# Patient Record
Sex: Male | Born: 1945 | Hispanic: No | Marital: Married | Smoking: Former smoker
Health system: Southern US, Community
[De-identification: ages and names within clinical notes are randomized; demographics above are authoritative.]

## PROBLEM LIST (undated history)

## (undated) DIAGNOSIS — I499 Cardiac arrhythmia, unspecified: Secondary | ICD-10-CM

## (undated) DIAGNOSIS — J449 Chronic obstructive pulmonary disease, unspecified: Secondary | ICD-10-CM

## (undated) DIAGNOSIS — K7460 Unspecified cirrhosis of liver: Secondary | ICD-10-CM

## (undated) DIAGNOSIS — I10 Essential (primary) hypertension: Secondary | ICD-10-CM

## (undated) DIAGNOSIS — I96 Gangrene, not elsewhere classified: Secondary | ICD-10-CM

## (undated) DIAGNOSIS — I2510 Atherosclerotic heart disease of native coronary artery without angina pectoris: Secondary | ICD-10-CM

## (undated) DIAGNOSIS — M869 Osteomyelitis, unspecified: Secondary | ICD-10-CM

## (undated) DIAGNOSIS — I739 Peripheral vascular disease, unspecified: Secondary | ICD-10-CM

## (undated) DIAGNOSIS — I4891 Unspecified atrial fibrillation: Secondary | ICD-10-CM

## (undated) DIAGNOSIS — R7303 Prediabetes: Secondary | ICD-10-CM

## (undated) DIAGNOSIS — G40909 Epilepsy, unspecified, not intractable, without status epilepticus: Secondary | ICD-10-CM

## (undated) HISTORY — PX: CARDIAC CATHETERIZATION: SHX172

---

## 1996-10-29 HISTORY — PX: HERNIA REPAIR: SHX51

## 2004-08-17 ENCOUNTER — Ambulatory Visit: Payer: Self-pay | Admitting: Pain Medicine

## 2004-09-14 ENCOUNTER — Ambulatory Visit: Payer: Self-pay | Admitting: Pain Medicine

## 2004-10-17 ENCOUNTER — Ambulatory Visit: Payer: Self-pay | Admitting: Pain Medicine

## 2004-11-14 ENCOUNTER — Ambulatory Visit: Payer: Self-pay | Admitting: Pain Medicine

## 2004-11-27 ENCOUNTER — Ambulatory Visit: Payer: Self-pay | Admitting: Internal Medicine

## 2004-11-29 ENCOUNTER — Ambulatory Visit: Payer: Self-pay | Admitting: Internal Medicine

## 2004-12-12 ENCOUNTER — Ambulatory Visit: Payer: Self-pay | Admitting: Pain Medicine

## 2004-12-29 ENCOUNTER — Ambulatory Visit: Payer: Self-pay | Admitting: Internal Medicine

## 2005-01-09 ENCOUNTER — Ambulatory Visit: Payer: Self-pay | Admitting: Pain Medicine

## 2005-01-27 ENCOUNTER — Ambulatory Visit: Payer: Self-pay | Admitting: Internal Medicine

## 2005-02-06 ENCOUNTER — Ambulatory Visit: Payer: Self-pay | Admitting: Pain Medicine

## 2005-03-03 ENCOUNTER — Other Ambulatory Visit: Payer: Self-pay

## 2005-03-03 ENCOUNTER — Inpatient Hospital Stay: Payer: Self-pay | Admitting: Unknown Physician Specialty

## 2005-03-06 ENCOUNTER — Ambulatory Visit: Payer: Self-pay | Admitting: Pain Medicine

## 2005-04-05 ENCOUNTER — Ambulatory Visit: Payer: Self-pay | Admitting: Pain Medicine

## 2005-05-07 ENCOUNTER — Ambulatory Visit: Payer: Self-pay | Admitting: Pain Medicine

## 2005-06-21 ENCOUNTER — Ambulatory Visit: Payer: Self-pay | Admitting: Pain Medicine

## 2005-07-19 ENCOUNTER — Ambulatory Visit: Payer: Self-pay | Admitting: Pain Medicine

## 2005-08-21 ENCOUNTER — Ambulatory Visit: Payer: Self-pay | Admitting: Pain Medicine

## 2005-09-11 ENCOUNTER — Ambulatory Visit: Payer: Self-pay | Admitting: Pain Medicine

## 2005-09-27 ENCOUNTER — Ambulatory Visit: Payer: Self-pay | Admitting: Oncology

## 2005-10-09 ENCOUNTER — Ambulatory Visit: Payer: Self-pay | Admitting: Pain Medicine

## 2005-11-08 ENCOUNTER — Ambulatory Visit: Payer: Self-pay | Admitting: Pain Medicine

## 2005-11-26 ENCOUNTER — Ambulatory Visit: Payer: Self-pay | Admitting: Oncology

## 2005-12-06 ENCOUNTER — Ambulatory Visit: Payer: Self-pay | Admitting: Pain Medicine

## 2006-01-10 ENCOUNTER — Ambulatory Visit: Payer: Self-pay | Admitting: Pain Medicine

## 2006-02-07 ENCOUNTER — Ambulatory Visit: Payer: Self-pay | Admitting: Pain Medicine

## 2006-03-04 ENCOUNTER — Emergency Department: Payer: Self-pay | Admitting: Emergency Medicine

## 2006-03-04 ENCOUNTER — Other Ambulatory Visit: Payer: Self-pay

## 2006-03-07 ENCOUNTER — Inpatient Hospital Stay: Payer: Self-pay | Admitting: Podiatry

## 2006-03-07 ENCOUNTER — Ambulatory Visit: Payer: Self-pay | Admitting: Pain Medicine

## 2006-04-04 ENCOUNTER — Ambulatory Visit: Payer: Self-pay | Admitting: Pain Medicine

## 2006-04-30 ENCOUNTER — Ambulatory Visit: Payer: Self-pay | Admitting: Pain Medicine

## 2006-06-06 ENCOUNTER — Ambulatory Visit: Payer: Self-pay | Admitting: Pain Medicine

## 2006-06-20 ENCOUNTER — Ambulatory Visit: Payer: Self-pay | Admitting: Pain Medicine

## 2006-06-24 ENCOUNTER — Ambulatory Visit: Payer: Self-pay | Admitting: Pain Medicine

## 2006-06-27 ENCOUNTER — Ambulatory Visit: Payer: Self-pay | Admitting: Pain Medicine

## 2006-07-16 ENCOUNTER — Ambulatory Visit: Payer: Self-pay | Admitting: Pain Medicine

## 2006-07-17 ENCOUNTER — Ambulatory Visit: Payer: Self-pay | Admitting: Pain Medicine

## 2006-08-15 ENCOUNTER — Ambulatory Visit: Payer: Self-pay | Admitting: Pain Medicine

## 2006-08-26 ENCOUNTER — Ambulatory Visit: Payer: Self-pay | Admitting: Pain Medicine

## 2006-09-10 ENCOUNTER — Ambulatory Visit: Payer: Self-pay | Admitting: Pain Medicine

## 2006-10-10 ENCOUNTER — Ambulatory Visit: Payer: Self-pay | Admitting: Pain Medicine

## 2006-11-06 ENCOUNTER — Ambulatory Visit: Payer: Self-pay | Admitting: Pain Medicine

## 2006-11-25 ENCOUNTER — Inpatient Hospital Stay: Payer: Self-pay | Admitting: Internal Medicine

## 2006-12-05 ENCOUNTER — Ambulatory Visit: Payer: Self-pay | Admitting: Pain Medicine

## 2007-01-06 ENCOUNTER — Ambulatory Visit: Payer: Self-pay | Admitting: Pain Medicine

## 2007-02-04 ENCOUNTER — Ambulatory Visit: Payer: Self-pay | Admitting: Pain Medicine

## 2007-03-06 ENCOUNTER — Ambulatory Visit: Payer: Self-pay | Admitting: Pain Medicine

## 2007-04-08 ENCOUNTER — Ambulatory Visit: Payer: Self-pay | Admitting: Pain Medicine

## 2007-05-06 ENCOUNTER — Ambulatory Visit: Payer: Self-pay | Admitting: Pain Medicine

## 2007-05-12 ENCOUNTER — Ambulatory Visit: Payer: Self-pay | Admitting: Pain Medicine

## 2007-06-05 ENCOUNTER — Ambulatory Visit: Payer: Self-pay | Admitting: Pain Medicine

## 2007-07-02 ENCOUNTER — Ambulatory Visit: Payer: Self-pay | Admitting: Pain Medicine

## 2007-07-22 ENCOUNTER — Other Ambulatory Visit: Payer: Self-pay

## 2007-07-22 ENCOUNTER — Inpatient Hospital Stay: Payer: Self-pay | Admitting: Cardiovascular Disease

## 2007-07-24 ENCOUNTER — Other Ambulatory Visit: Payer: Self-pay

## 2007-07-31 ENCOUNTER — Ambulatory Visit: Payer: Self-pay | Admitting: Pain Medicine

## 2007-08-27 ENCOUNTER — Ambulatory Visit: Payer: Self-pay | Admitting: Pain Medicine

## 2007-09-08 ENCOUNTER — Other Ambulatory Visit: Payer: Self-pay

## 2007-09-08 ENCOUNTER — Emergency Department: Payer: Self-pay | Admitting: Emergency Medicine

## 2007-09-30 ENCOUNTER — Ambulatory Visit: Payer: Self-pay | Admitting: Pain Medicine

## 2007-11-03 ENCOUNTER — Ambulatory Visit: Payer: Self-pay | Admitting: Pain Medicine

## 2007-12-02 ENCOUNTER — Ambulatory Visit: Payer: Self-pay | Admitting: Pain Medicine

## 2007-12-10 ENCOUNTER — Ambulatory Visit: Payer: Self-pay | Admitting: Pain Medicine

## 2007-12-30 ENCOUNTER — Ambulatory Visit: Payer: Self-pay | Admitting: Pain Medicine

## 2008-01-05 ENCOUNTER — Ambulatory Visit: Payer: Self-pay | Admitting: Pain Medicine

## 2008-01-09 ENCOUNTER — Other Ambulatory Visit: Payer: Self-pay

## 2008-01-09 ENCOUNTER — Emergency Department: Payer: Self-pay | Admitting: Emergency Medicine

## 2008-01-29 ENCOUNTER — Ambulatory Visit: Payer: Self-pay | Admitting: Pain Medicine

## 2008-02-04 ENCOUNTER — Ambulatory Visit: Payer: Self-pay | Admitting: Pain Medicine

## 2008-02-26 ENCOUNTER — Inpatient Hospital Stay: Payer: Self-pay | Admitting: Psychiatry

## 2008-03-25 ENCOUNTER — Ambulatory Visit: Payer: Self-pay | Admitting: Pain Medicine

## 2008-04-15 ENCOUNTER — Ambulatory Visit: Payer: Self-pay | Admitting: Pain Medicine

## 2008-04-20 ENCOUNTER — Ambulatory Visit: Payer: Self-pay | Admitting: Vascular Surgery

## 2008-05-13 ENCOUNTER — Ambulatory Visit: Payer: Self-pay | Admitting: Pain Medicine

## 2008-06-14 ENCOUNTER — Ambulatory Visit: Payer: Self-pay | Admitting: Pain Medicine

## 2008-07-15 ENCOUNTER — Ambulatory Visit: Payer: Self-pay | Admitting: Pain Medicine

## 2008-07-28 ENCOUNTER — Ambulatory Visit: Payer: Self-pay | Admitting: Pain Medicine

## 2008-08-26 ENCOUNTER — Ambulatory Visit: Payer: Self-pay | Admitting: Pain Medicine

## 2008-09-01 ENCOUNTER — Ambulatory Visit: Payer: Self-pay | Admitting: Pain Medicine

## 2008-09-28 ENCOUNTER — Ambulatory Visit: Payer: Self-pay | Admitting: Pain Medicine

## 2008-10-26 ENCOUNTER — Ambulatory Visit: Payer: Self-pay | Admitting: Pain Medicine

## 2008-11-08 ENCOUNTER — Ambulatory Visit: Payer: Self-pay | Admitting: Cardiovascular Disease

## 2008-11-09 IMAGING — CR DG ABDOMEN 2V
1 series · 3 of 3 positions shown · non-contrast
Comparison: none

REASON FOR EXAM: Abdominal pain
COMMENTS:

PROCEDURE:     DXR - DXR ABDOMEN 2 V FLAT AND ERECT  - November 26, 2006  [DATE]
RESULT:       The soft tissue structures are unremarkable.  Inferior vena
cava filter is present.  Aortoiliac atherosclerotic vascular disease is
present.

[Series 1: view not recorded · 0.17mm/px · 3 of 3 slices shown]
[im 1/3]
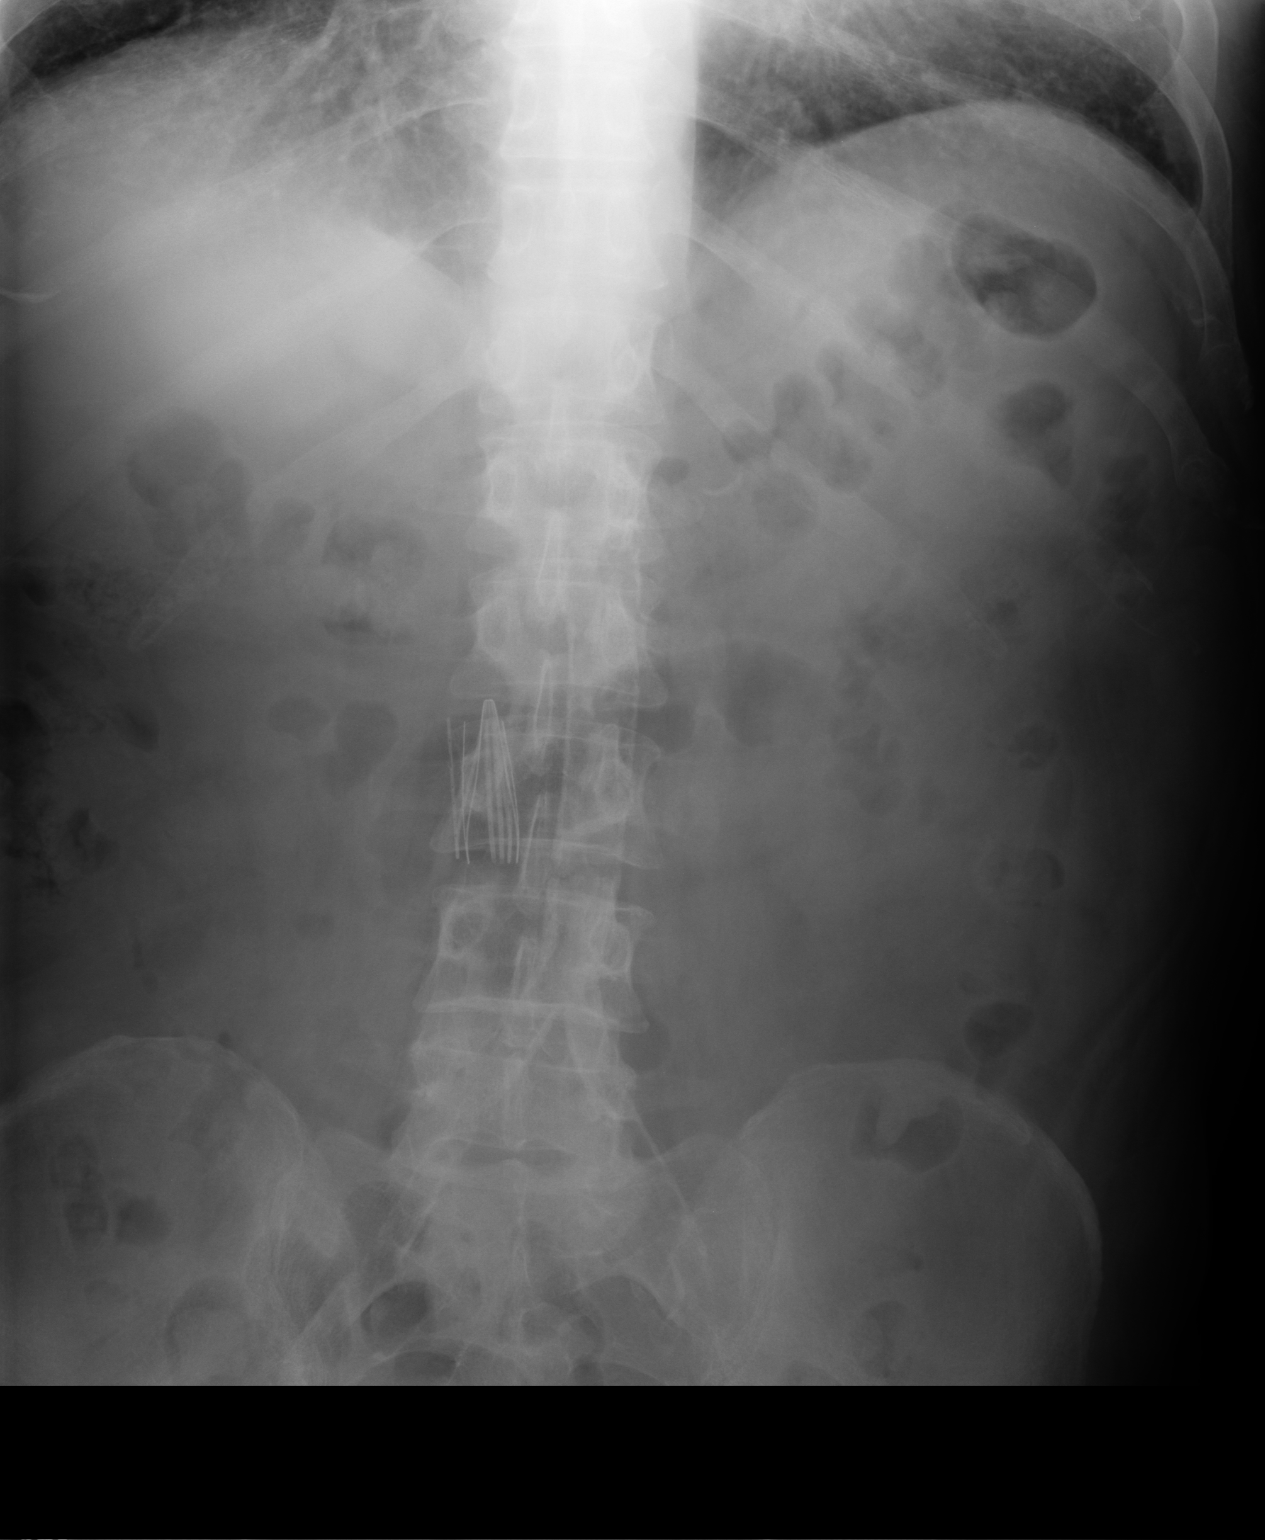
[im 2/3]
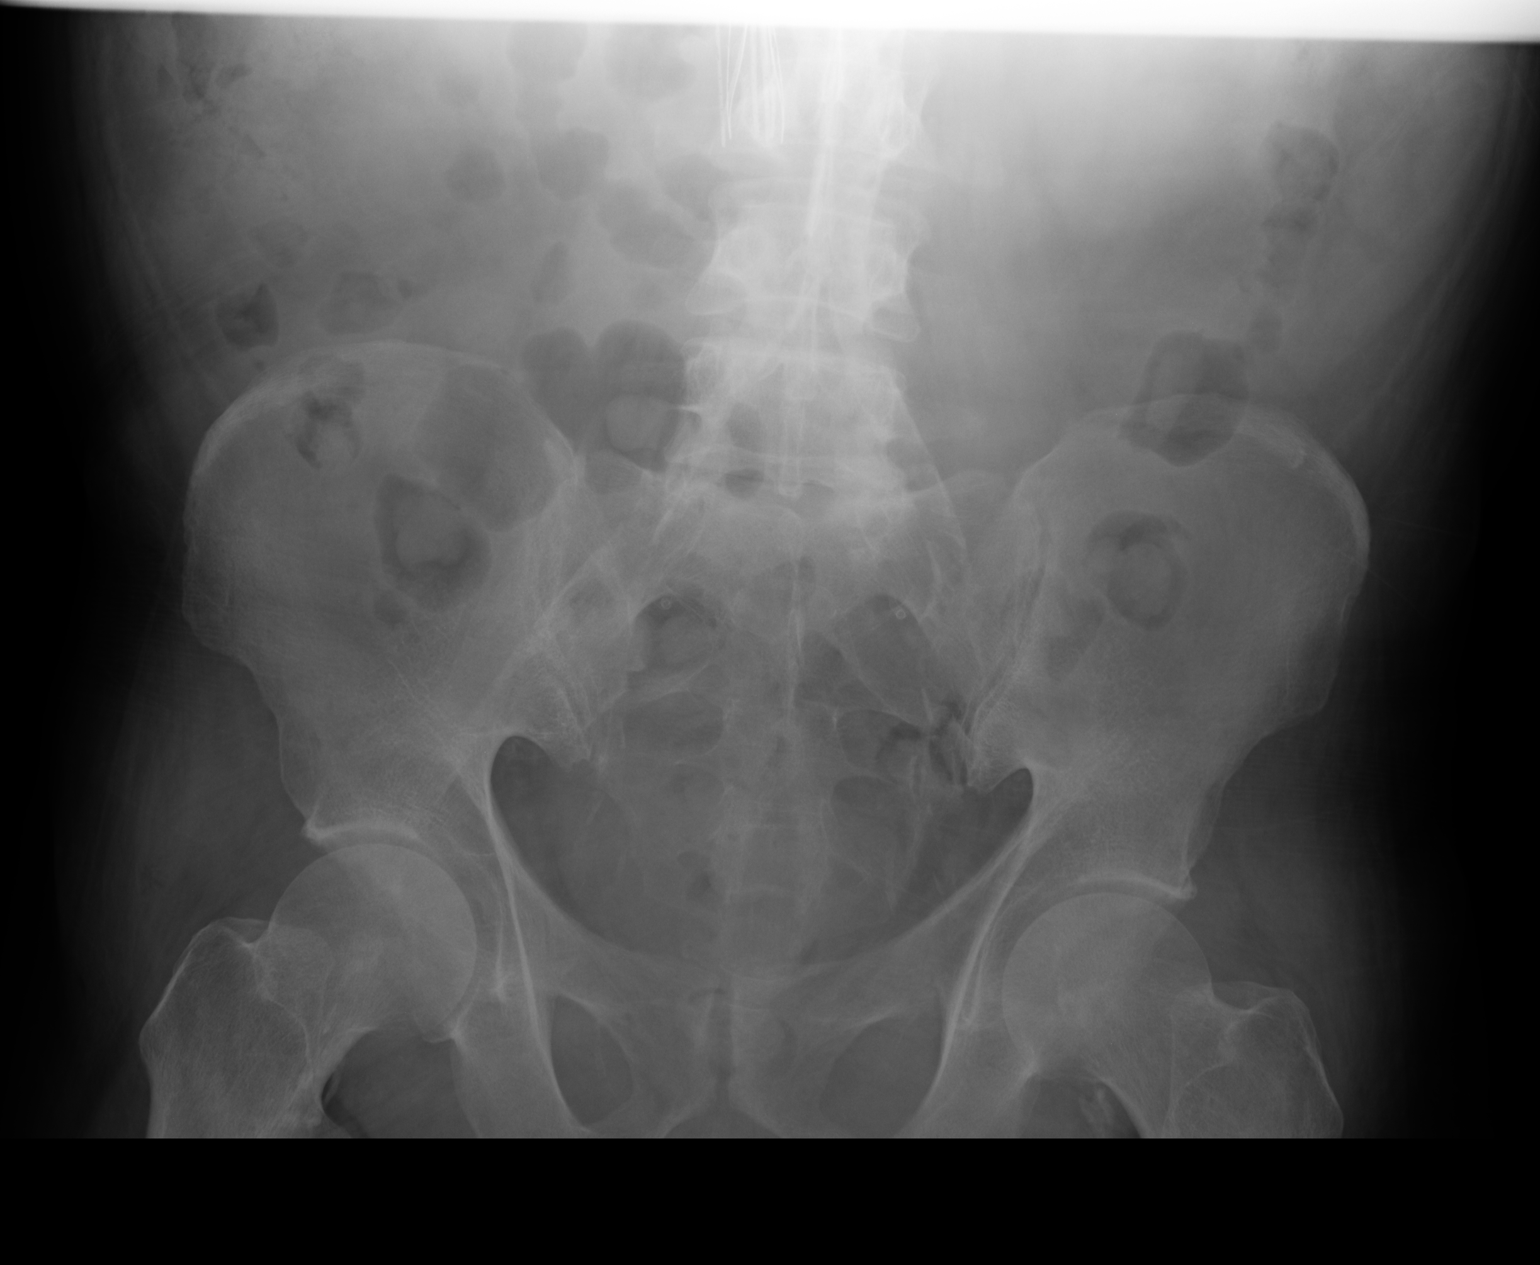
[im 3/3]
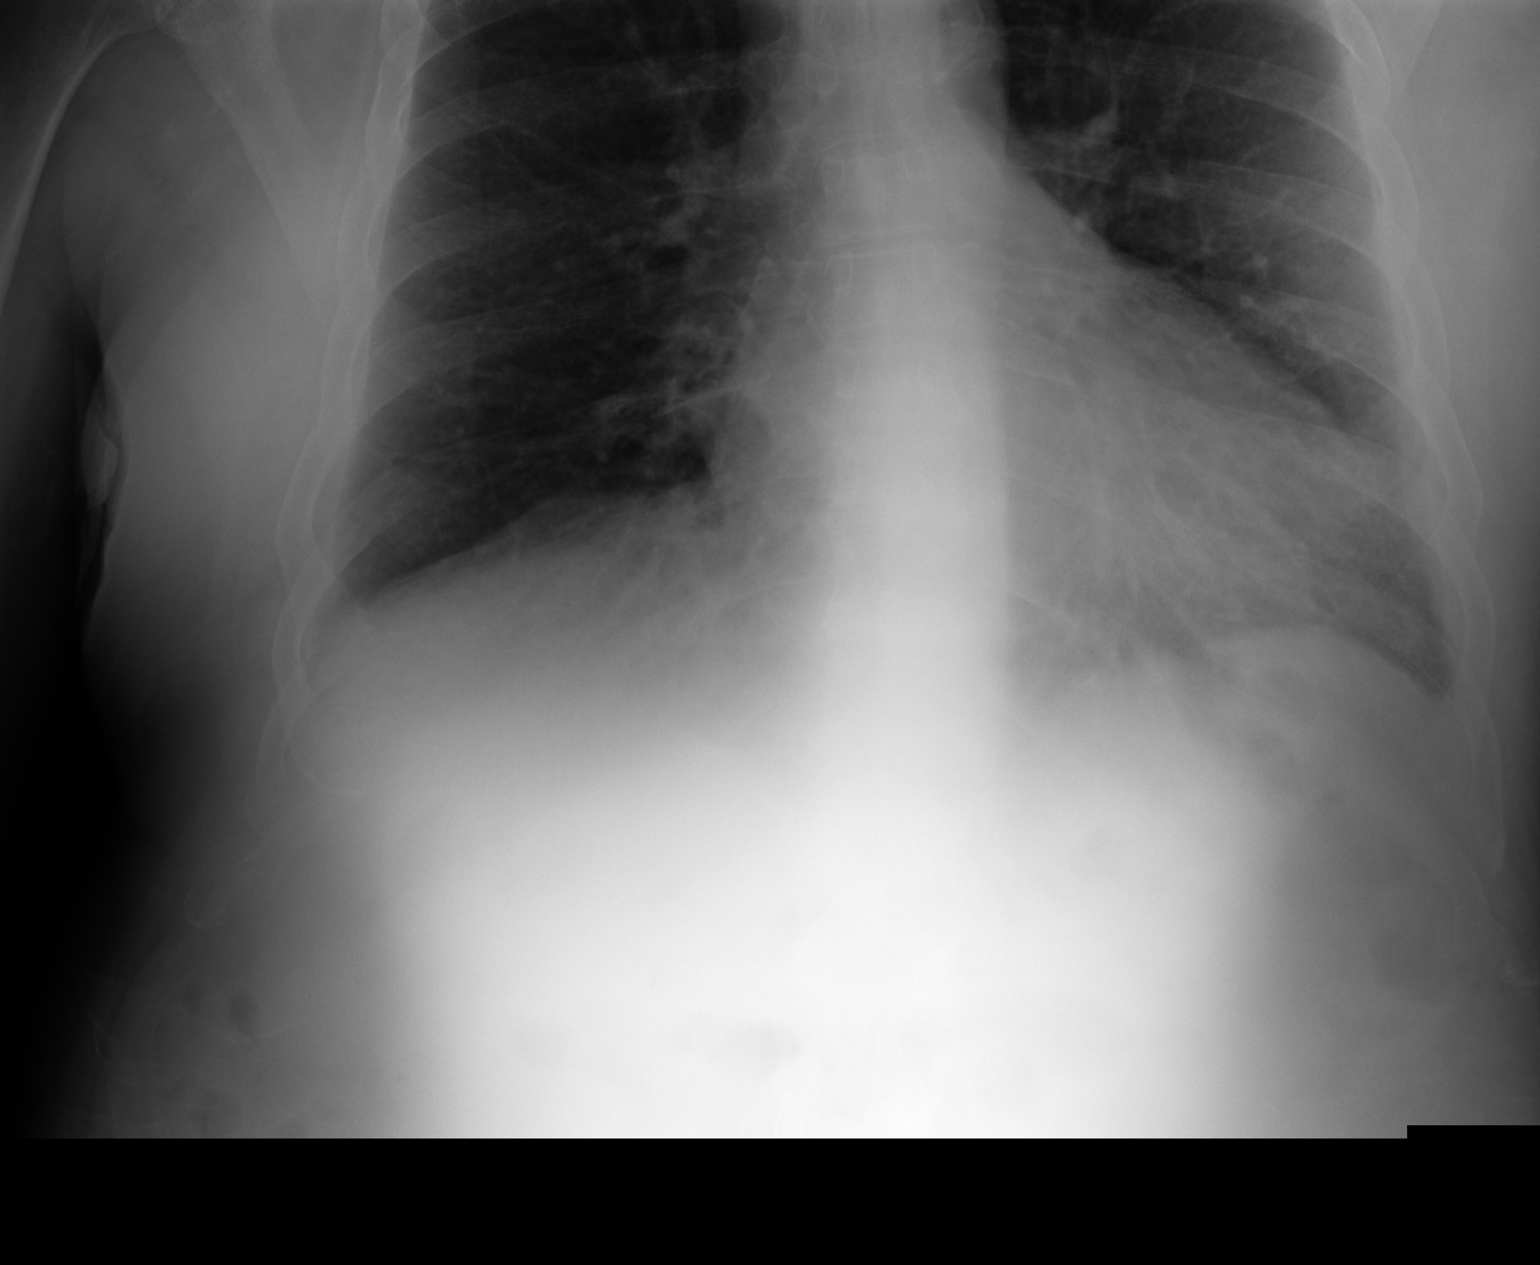

[3 of 3 positions shown; findings below may reference images not displayed]

IMPRESSION: Nonspecific exam.

## 2008-11-22 ENCOUNTER — Inpatient Hospital Stay: Payer: Self-pay | Admitting: Cardiovascular Disease

## 2008-11-25 ENCOUNTER — Ambulatory Visit: Payer: Self-pay | Admitting: Pain Medicine

## 2008-12-23 ENCOUNTER — Ambulatory Visit: Payer: Self-pay | Admitting: Pain Medicine

## 2009-01-20 ENCOUNTER — Ambulatory Visit: Payer: Self-pay | Admitting: Pain Medicine

## 2009-01-27 ENCOUNTER — Ambulatory Visit: Payer: Self-pay | Admitting: Internal Medicine

## 2009-02-09 ENCOUNTER — Ambulatory Visit: Payer: Self-pay | Admitting: Pain Medicine

## 2009-02-26 ENCOUNTER — Ambulatory Visit: Payer: Self-pay | Admitting: Internal Medicine

## 2009-03-22 ENCOUNTER — Ambulatory Visit: Payer: Self-pay | Admitting: Pain Medicine

## 2009-03-28 ENCOUNTER — Ambulatory Visit: Payer: Self-pay | Admitting: Internal Medicine

## 2009-03-29 ENCOUNTER — Ambulatory Visit: Payer: Self-pay | Admitting: Internal Medicine

## 2009-04-12 ENCOUNTER — Inpatient Hospital Stay: Payer: Self-pay | Admitting: Internal Medicine

## 2009-04-21 ENCOUNTER — Ambulatory Visit: Payer: Self-pay | Admitting: Pain Medicine

## 2009-05-18 ENCOUNTER — Ambulatory Visit: Payer: Self-pay | Admitting: Pain Medicine

## 2009-06-16 ENCOUNTER — Ambulatory Visit: Payer: Self-pay | Admitting: Pain Medicine

## 2009-07-18 ENCOUNTER — Ambulatory Visit: Payer: Self-pay | Admitting: Pain Medicine

## 2009-08-18 ENCOUNTER — Ambulatory Visit: Payer: Self-pay | Admitting: Pain Medicine

## 2009-09-13 ENCOUNTER — Ambulatory Visit: Payer: Self-pay | Admitting: Pain Medicine

## 2009-10-11 ENCOUNTER — Ambulatory Visit: Payer: Self-pay | Admitting: Pain Medicine

## 2009-11-10 ENCOUNTER — Ambulatory Visit: Payer: Self-pay | Admitting: Pain Medicine

## 2009-12-08 ENCOUNTER — Ambulatory Visit: Payer: Self-pay | Admitting: Pain Medicine

## 2010-01-05 ENCOUNTER — Ambulatory Visit: Payer: Self-pay | Admitting: Pain Medicine

## 2010-02-07 ENCOUNTER — Ambulatory Visit: Payer: Self-pay | Admitting: Pain Medicine

## 2010-03-09 ENCOUNTER — Ambulatory Visit: Payer: Self-pay | Admitting: Pain Medicine

## 2010-04-06 ENCOUNTER — Ambulatory Visit: Payer: Self-pay | Admitting: Pain Medicine

## 2010-05-09 ENCOUNTER — Ambulatory Visit: Payer: Self-pay | Admitting: Pain Medicine

## 2010-06-08 ENCOUNTER — Ambulatory Visit: Payer: Self-pay | Admitting: Pain Medicine

## 2010-07-06 ENCOUNTER — Ambulatory Visit: Payer: Self-pay | Admitting: Pain Medicine

## 2010-07-31 ENCOUNTER — Ambulatory Visit: Payer: Self-pay | Admitting: Pain Medicine

## 2010-09-05 ENCOUNTER — Ambulatory Visit: Payer: Self-pay | Admitting: Pain Medicine

## 2010-10-05 ENCOUNTER — Ambulatory Visit: Payer: Self-pay | Admitting: Pain Medicine

## 2010-10-09 ENCOUNTER — Ambulatory Visit: Payer: Self-pay | Admitting: Pain Medicine

## 2010-10-31 ENCOUNTER — Ambulatory Visit: Payer: Self-pay | Admitting: Pain Medicine

## 2010-12-04 ENCOUNTER — Ambulatory Visit: Payer: Self-pay | Admitting: Pain Medicine

## 2010-12-15 ENCOUNTER — Ambulatory Visit: Payer: Self-pay | Admitting: Cardiovascular Disease

## 2011-01-02 ENCOUNTER — Ambulatory Visit: Payer: Self-pay | Admitting: Pain Medicine

## 2011-01-04 ENCOUNTER — Inpatient Hospital Stay: Payer: Self-pay | Admitting: Internal Medicine

## 2011-02-01 ENCOUNTER — Ambulatory Visit: Payer: Self-pay | Admitting: Pain Medicine

## 2011-03-02 ENCOUNTER — Inpatient Hospital Stay: Payer: Self-pay | Admitting: Internal Medicine

## 2011-03-06 ENCOUNTER — Ambulatory Visit: Payer: Self-pay | Admitting: Pain Medicine

## 2011-04-03 ENCOUNTER — Ambulatory Visit: Payer: Self-pay | Admitting: Pain Medicine

## 2011-05-01 ENCOUNTER — Ambulatory Visit: Payer: Self-pay | Admitting: Pain Medicine

## 2011-05-15 ENCOUNTER — Ambulatory Visit: Payer: Self-pay | Admitting: Unknown Physician Specialty

## 2011-05-31 ENCOUNTER — Ambulatory Visit: Payer: Self-pay | Admitting: Pain Medicine

## 2011-07-03 ENCOUNTER — Ambulatory Visit: Payer: Self-pay | Admitting: Pain Medicine

## 2011-07-09 ENCOUNTER — Ambulatory Visit: Payer: Self-pay | Admitting: Unknown Physician Specialty

## 2011-07-11 ENCOUNTER — Ambulatory Visit: Payer: Self-pay | Admitting: Pain Medicine

## 2011-07-31 ENCOUNTER — Ambulatory Visit: Payer: Self-pay | Admitting: Pain Medicine

## 2011-08-30 ENCOUNTER — Ambulatory Visit: Payer: Self-pay | Admitting: Pain Medicine

## 2011-09-27 ENCOUNTER — Ambulatory Visit: Payer: Self-pay | Admitting: Pain Medicine

## 2011-11-01 ENCOUNTER — Ambulatory Visit: Payer: Self-pay | Admitting: Pain Medicine

## 2011-11-29 ENCOUNTER — Ambulatory Visit: Payer: Self-pay | Admitting: Pain Medicine

## 2011-12-25 ENCOUNTER — Ambulatory Visit: Payer: Self-pay | Admitting: Pain Medicine

## 2012-01-22 ENCOUNTER — Ambulatory Visit: Payer: Self-pay | Admitting: Pain Medicine

## 2012-02-21 ENCOUNTER — Ambulatory Visit: Payer: Self-pay | Admitting: Pain Medicine

## 2012-03-27 ENCOUNTER — Ambulatory Visit: Payer: Self-pay | Admitting: Pain Medicine

## 2013-07-29 LAB — COMPREHENSIVE METABOLIC PANEL
Albumin: 1.8 g/dL — ABNORMAL LOW (ref 3.4–5.0)
Anion Gap: 7 (ref 7–16)
BUN: 2 mg/dL — ABNORMAL LOW (ref 7–18)
Bilirubin,Total: 0.8 mg/dL (ref 0.2–1.0)
Calcium, Total: 7.1 mg/dL — ABNORMAL LOW (ref 8.5–10.1)
Chloride: 90 mmol/L — ABNORMAL LOW (ref 98–107)
Co2: 32 mmol/L (ref 21–32)
Creatinine: 0.86 mg/dL (ref 0.60–1.30)
EGFR (African American): >60
Glucose: 93 mg/dL (ref 65–99)
Osmolality: 255 (ref 275–301)
Potassium: 2.6 mmol/L — ABNORMAL LOW (ref 3.5–5.1)
SGOT(AST): 31 U/L (ref 15–37)
SGPT (ALT): 10 U/L — ABNORMAL LOW (ref 12–78)
Sodium: 129 mmol/L — ABNORMAL LOW (ref 136–145)
Total Protein: 6.8 g/dL (ref 6.4–8.2)

## 2013-07-29 LAB — ETHANOL: Ethanol: <3 mg/dL

## 2013-07-29 LAB — CBC: HGB: 10.4 g/dL — ABNORMAL LOW (ref 13.0–18.0)

## 2013-07-30 ENCOUNTER — Inpatient Hospital Stay: Payer: Self-pay | Admitting: Internal Medicine

## 2013-07-30 LAB — POTASSIUM
Potassium: 3.3 mmol/L — ABNORMAL LOW (ref 3.5–5.1)
Potassium: 2.6 mmol/L — ABNORMAL LOW (ref 3.5–5.1)

## 2013-07-30 LAB — MAGNESIUM
Magnesium: 0.4 mg/dL — ABNORMAL LOW
Magnesium: 1.2 mg/dL — ABNORMAL LOW

## 2013-07-30 LAB — BASIC METABOLIC PANEL
Calcium, Total: 6.5 mg/dL — CL (ref 8.5–10.1)
Chloride: 91 mmol/L — ABNORMAL LOW (ref 98–107)
Co2: 31 mmol/L (ref 21–32)
EGFR (African American): >60
Potassium: 3.1 mmol/L — ABNORMAL LOW (ref 3.5–5.1)

## 2013-07-30 LAB — URINALYSIS, COMPLETE
Bacteria: NONE SEEN
Bilirubin,UR: NEGATIVE
Blood: NEGATIVE
Glucose,UR: NEGATIVE mg/dL (ref 0–75)
Hyaline Cast: 2
Nitrite: NEGATIVE
Ph: 8 (ref 4.5–8.0)
RBC,UR: <1 /HPF (ref 0–5)
Squamous Epithelial: NONE SEEN

## 2013-07-30 LAB — DRUG SCREEN, URINE
Amphetamines, Ur Screen: NEGATIVE (ref ?–1000)
Barbiturates, Ur Screen: NEGATIVE (ref ?–200)
Cannabinoid 50 Ng, Ur ~~LOC~~: NEGATIVE (ref ?–50)
MDMA (Ecstasy)Ur Screen: NEGATIVE (ref ?–500)

## 2013-07-30 LAB — CK TOTAL AND CKMB (NOT AT ARMC)
CK, Total: 138 U/L (ref 35–232)
CK, Total: 250 U/L — ABNORMAL HIGH (ref 35–232)
CK, Total: 323 U/L — ABNORMAL HIGH (ref 35–232)

## 2013-07-30 LAB — PRO B NATRIURETIC PEPTIDE: B-Type Natriuretic Peptide: 2535 pg/mL — ABNORMAL HIGH (ref 0–125)

## 2013-07-30 LAB — TROPONIN I
Troponin-I: 0.1 ng/mL — ABNORMAL HIGH
Troponin-I: 0.11 ng/mL — ABNORMAL HIGH

## 2013-07-31 LAB — COMPREHENSIVE METABOLIC PANEL
Anion Gap: 6 — ABNORMAL LOW (ref 7–16)
BUN: 4 mg/dL — ABNORMAL LOW (ref 7–18)
Calcium, Total: 6.8 mg/dL — CL (ref 8.5–10.1)
Co2: 31 mmol/L (ref 21–32)
Creatinine: 0.9 mg/dL (ref 0.60–1.30)
EGFR (African American): >60
EGFR (Non-African Amer.): >60
Glucose: 104 mg/dL — ABNORMAL HIGH (ref 65–99)
Osmolality: 260 (ref 275–301)
Potassium: 3.1 mmol/L — ABNORMAL LOW (ref 3.5–5.1)
SGOT(AST): 37 U/L (ref 15–37)
Sodium: 131 mmol/L — ABNORMAL LOW (ref 136–145)

## 2013-07-31 LAB — CBC WITH DIFFERENTIAL/PLATELET
Basophil #: 0.1 10*3/uL (ref 0.0–0.1)
Basophil %: 0.3 %
Eosinophil #: 0 10*3/uL (ref 0.0–0.7)
Eosinophil %: 0 %
HCT: 31.2 % — ABNORMAL LOW (ref 40.0–52.0)
HGB: 10.6 g/dL — ABNORMAL LOW (ref 13.0–18.0)
Lymphocyte #: 1.7 10*3/uL (ref 1.0–3.6)
Lymphocyte %: 5.6 %
MCH: 28.8 pg (ref 26.0–34.0)
MCHC: 34 g/dL (ref 32.0–36.0)
MCV: 85 fL (ref 80–100)
Monocyte #: 0.9 x10 3/mm (ref 0.2–1.0)
Neutrophil %: 91.2 %
Platelet: 156 10*3/uL (ref 150–440)
RBC: 3.69 10*6/uL — ABNORMAL LOW (ref 4.40–5.90)
RDW: 14 % (ref 11.5–14.5)
WBC: 31.1 10*3/uL — ABNORMAL HIGH (ref 3.8–10.6)

## 2013-07-31 LAB — PROTIME-INR: INR: 2.1

## 2013-07-31 LAB — TROPONIN I: Troponin-I: 0.09 ng/mL — ABNORMAL HIGH

## 2013-07-31 LAB — TSH: Thyroid Stimulating Horm: 1.29 u[IU]/mL

## 2013-07-31 LAB — PHENYTOIN LEVEL, TOTAL: Dilantin: 8.4 ug/mL — ABNORMAL LOW (ref 10.0–20.0)

## 2013-07-31 LAB — LIPID PANEL
HDL Cholesterol: 24 mg/dL — ABNORMAL LOW (ref 40–60)
Ldl Cholesterol, Calc: 26 mg/dL (ref 0–100)
VLDL Cholesterol, Calc: 12 mg/dL (ref 5–40)

## 2013-07-31 LAB — URINE CULTURE

## 2013-07-31 LAB — MAGNESIUM: Magnesium: 1.8 mg/dL

## 2013-08-01 ENCOUNTER — Ambulatory Visit: Payer: Self-pay | Admitting: Neurology

## 2013-08-01 LAB — BASIC METABOLIC PANEL
Chloride: 99 mmol/L (ref 98–107)
Co2: 31 mmol/L (ref 21–32)
Creatinine: 0.85 mg/dL (ref 0.60–1.30)
EGFR (Non-African Amer.): >60
Glucose: 74 mg/dL (ref 65–99)
Sodium: 135 mmol/L — ABNORMAL LOW (ref 136–145)

## 2013-08-01 LAB — MAGNESIUM: Magnesium: 1.6 mg/dL — ABNORMAL LOW

## 2013-08-02 LAB — PHENYTOIN LEVEL, TOTAL: Dilantin: 7.5 ug/mL — ABNORMAL LOW (ref 10.0–20.0)

## 2013-08-02 LAB — BASIC METABOLIC PANEL
Anion Gap: 5 — ABNORMAL LOW (ref 7–16)
BUN: 4 mg/dL — ABNORMAL LOW (ref 7–18)
Creatinine: 0.94 mg/dL (ref 0.60–1.30)
EGFR (African American): >60
Glucose: 80 mg/dL (ref 65–99)
Osmolality: 266 (ref 275–301)
Sodium: 135 mmol/L — ABNORMAL LOW (ref 136–145)

## 2013-08-02 LAB — MAGNESIUM: Magnesium: 1.7 mg/dL — ABNORMAL LOW

## 2013-08-03 LAB — CBC WITH DIFFERENTIAL/PLATELET
Basophil #: 0.1 10*3/uL (ref 0.0–0.1)
Eosinophil #: 0.3 10*3/uL (ref 0.0–0.7)
HCT: 24.9 % — ABNORMAL LOW (ref 40.0–52.0)
HGB: 8.5 g/dL — ABNORMAL LOW (ref 13.0–18.0)
Lymphocyte %: 29.5 %
MCH: 29.5 pg (ref 26.0–34.0)
MCV: 87 fL (ref 80–100)
Neutrophil #: 4 10*3/uL (ref 1.4–6.5)
Neutrophil %: 56.4 %
Platelet: 142 10*3/uL — ABNORMAL LOW (ref 150–440)
RBC: 2.86 10*6/uL — ABNORMAL LOW (ref 4.40–5.90)
RDW: 14.4 % (ref 11.5–14.5)

## 2013-08-03 LAB — COMPREHENSIVE METABOLIC PANEL
BUN: 4 mg/dL — ABNORMAL LOW (ref 7–18)
Calcium, Total: 7.4 mg/dL — ABNORMAL LOW (ref 8.5–10.1)
Chloride: 100 mmol/L (ref 98–107)
Creatinine: 0.91 mg/dL (ref 0.60–1.30)
EGFR (African American): >60
EGFR (Non-African Amer.): >60
SGOT(AST): 26 U/L (ref 15–37)
SGPT (ALT): 11 U/L — ABNORMAL LOW (ref 12–78)
Sodium: 134 mmol/L — ABNORMAL LOW (ref 136–145)

## 2013-08-03 LAB — BASIC METABOLIC PANEL
Anion Gap: 4 — ABNORMAL LOW (ref 7–16)
BUN: 4 mg/dL — ABNORMAL LOW (ref 7–18)
Calcium, Total: 7.3 mg/dL — ABNORMAL LOW (ref 8.5–10.1)
Chloride: 101 mmol/L (ref 98–107)
EGFR (African American): >60
Glucose: 70 mg/dL (ref 65–99)
Sodium: 134 mmol/L — ABNORMAL LOW (ref 136–145)

## 2013-08-03 LAB — MAGNESIUM: Magnesium: 1.2 mg/dL — ABNORMAL LOW

## 2013-08-04 LAB — BASIC METABOLIC PANEL
Anion Gap: 5 — ABNORMAL LOW (ref 7–16)
BUN: 3 mg/dL — ABNORMAL LOW (ref 7–18)
Calcium, Total: 7.6 mg/dL — ABNORMAL LOW (ref 8.5–10.1)
Chloride: 100 mmol/L (ref 98–107)
Co2: 27 mmol/L (ref 21–32)
EGFR (African American): >60
EGFR (Non-African Amer.): >60
Glucose: 69 mg/dL (ref 65–99)
Osmolality: 259 (ref 275–301)
Potassium: 4.4 mmol/L (ref 3.5–5.1)

## 2013-08-04 LAB — MAGNESIUM: Magnesium: 1.2 mg/dL — ABNORMAL LOW

## 2013-08-04 LAB — HEMOGLOBIN: HGB: 8.1 g/dL — ABNORMAL LOW (ref 13.0–18.0)

## 2013-10-29 DIAGNOSIS — G40909 Epilepsy, unspecified, not intractable, without status epilepticus: Secondary | ICD-10-CM

## 2013-10-29 HISTORY — DX: Epilepsy, unspecified, not intractable, without status epilepticus: G40.909

## 2014-05-13 DIAGNOSIS — I739 Peripheral vascular disease, unspecified: Secondary | ICD-10-CM | POA: Diagnosis present

## 2015-02-18 NOTE — Consult Note (Signed)
Referring Physician:  Nicholes Mango :   Primary Care Physician:  Selinda Michaels, [address] [place], [place] [address], [place]  Reason for Consult: Admit Date: 30-Jul-2013  Chief Complaint: Seizures  Reason for Consult: seizures   History of Present Illness: History of Present Illness:   69 year old man with a complex medical history presents with seizure at home.  Patient's wife is not present during my encounter and patient cannot provide any details whatsoever so history is gathered from speaking with primary team and reading other notes.  Apparently has history of seizures, but most recent seizure wasn't til 2-3 years ago or so.  At some point in the past he was stopped on Keppra because it gave him rash and started on gabapentin monotherapy.  Yesterday patient was found down and complained of SOB.  When wife picked him up he "became stiff" and "started jerking his upper and lower extremities".  Seizing lasted about a minute.  There was incontiennce.  He went to the ED then and got ativan.  2 additional seizures in the ED.  HCT and EKG in ED were unremarkable.  Mag was found to be very low at 0.4, Na low at 129 and K very low at 2.6.  Per wife patient has not been using alcohol at all lately.     MEDICAL HISTORY: Coronary artery disease, seizures, diabetes mellitus diet-controlled, peripheral vascular disease, COPD,  hyperlipidemia, hemochromatosis, questionable history of cirrhosis, chronic leg pain; sees pain management doctor, neuropathy.   SURGICAL HISTORY:  vena cava filter, hernia repair surgery, iliac stents.  HISTORY:at home with wife. He used to drink, but quit drinking several years ago. Still smokes half to 1 pack a day. HISTORY: died at age 70 from CVA.  Brother died of myocardial infarction. Father died at age 19 from MI.   MEDICATIONS:  18 mcg inhalation via HandiHaler,75 mg once daily40 mg once daily 10 mg 1 tablet p.o. q12 hoursas needed300  mg 3 times a day80 mg once daily0.5 mg 2 to 4 tablets p.o. on daily basis81 mg 4 tablets once a day200 mg once daily10 mg 1 tablet by mouth at bedtime for insomnia5 mg 2 to 3 capsules p.o. daily  ALLERGIES:  KEPPRA, CAUSES RASH.  BWL:SLHTDSK denies all ROS questions, he says no to most questions so uncertain how accurate his responses are.  ROS:  General denies complaints   HEENT no complaints   Lungs no complaints   Cardiac no complaints   GI no complaints   GU no complaints   Musculoskeletal no complaints   Extremities no complaints   Skin no complaints   Endocrine no complaints   Psych no complaints   Past Medical/Surgical Hx:  Atrial Fibrillation:   HTN:   Hyperlipidemia:   CHF:   Hemochromatosis:   Diabetes:   COPD:   cellulitis left leg.:   PVD - Peripheral Vascular Disease:   depression:   hemochromatosis:   h/o DVT s/p IV filter:   seizures:   Cirrhosis:   Crohn's disease:   nueropathy:   MI - Myocardial Infarct:   CAD:   Alcohol Abuse:   Angioplasty:   Foot Surgery - Right:   - Abdominal: insertion of abdominal stents  Insertion of abdominal stents:   Home Medications:  Medication Instructions Last Modified Date/Time  AMBIEN  10  MG LIMIT  1  TAB PO  @  BEDTIME  PRN INSOMNIA  02-Oct-14 06:29  KLONOPIN  0.5  MG  LIMIT   2 - 4  TABS PO / DAY IF TOLERATED  30-May-13 06:30  0XYCODONE  5  MG  LIMIT  2 - 3  TABS PO / DAY  PRN BT PAIN IF TOLERATED 02-Oct-14 06:29  OXYCONTIN  10  MG LIMIT  1 TAB PO Q 12 HRS IF TOLERATED 02-Oct-14 06:29  Plavix 75 mg oral tablet 1 tab(s) orally once a day  02-Oct-14 06:29  Spiriva 18 mcg inhalation capsule Inhale 1 cap(s) via handihaler once a day  25-Apr-13 07:36  Neurontin 300 mg oral capsule 1 cap(s) orally 3 times a day  25-Apr-13 07:36  Nitrostat Take as needed for pain   02-Oct-14 06:29  pantoprazole 40 mg oral delayed release tablet 1  orally once a day  02-Oct-14 06:29  Vitamins  orally once a day with  minerals 02-Oct-14 06:29  aspirin 81 mg oral tablet 4 tab(s) orally once a day 02-Oct-14 06:29  amiodarone 200 mg oral tablet 1 tab(s) orally once a day 02-Oct-14 06:29  Lipitor 80 mg oral tablet 1 tab(s) orally once a day (at bedtime) 25-Apr-13 07:36  OxyCONTIN 20 mg oral tablet, extended release 1 tab(s) orally every 8 hours 02-Oct-14 10:01  Spiriva 18 mcg inhalation capsule 1 each inhaled once a day 02-Oct-14 10:01  sotalol 80 mg oral tablet 1 tab(s) orally once a day 02-Oct-14 10:01  Xarelto 20 mg oral tablet 1 tab(s) orally once a day 02-Oct-14 10:01  zolpidem 10 mg oral tablet 1 tab(s) orally once a day (at bedtime) 02-Oct-14 10:01  Nitrostat 0.4 mg sublingual tablet 1 tab(s) sublingual every 5 minutes, As Needed - for Chest Pain 02-Oct-14 10:01   [facility] Current Meds:   Sodium Chloride 0.9%, 1000 ml at 60 ml/hr  Enoxaparin injection, ( Lovenox injection )  40 mg, Subcutaneous, daily  Indication: Prophylaxis or treatment of thromboembolic disorders, Monitor Anticoags per hospital protocol  LORazepam injection,  ( Ativan injection )  2 mg, IV push, q2-4h PRN for seizures  Indication: Anxiety/ Seizure/ Antiemetic Adjunct/ Preop Sedation  Pantoprazole injection,  ( Protonix injection )  40 mg, IV push, q6am  Indication: Erosive Esophagitis/ GERD, 1. Reconstitute Protonix with 3m of Normal Saline; concentration= 424mmL. Once reconstituted; Protonix must be administered within 30 minutes.  2. I.V. line must be flushed before and after administration. Administer 1078m32m27m.V. push over 2-3 minutes.  Multivitamin Liquid - ADULT, ( Theragran liquid )  5 ml Oral daily  Stop After: 3 Days  -Indication:Prevention and Treatment of Vitamin Deficiencies  Acetaminophen suppository,  ( Tylenol supp )  650 mg Rectal q4h PRN for pain or temp. greater than 100.4  -Indication:Pain/Fever  Piperacillin-Tazobactam injection,  ( Zosyn )  3.375 gram, IV Piggyback, q8h, Infuse over 4  hour(s)  Indication: Infection  Pharmacy dose per Zosyn Ext Inf Protocol, <<<Extended infusion protocol>>>  Nursing Saline Flush, 3 to 6 ml, IV push, Q1M PRN for IV Maintenance  Influenza Virus Quadrivalent Vaccine injection, 0.5 ml, Intramuscular, once  Indication: provide Active Immunity to Influenza Strains contained in Vaccine, ***The patient must have a temperature of 100.5 or less, anything greater the patient needs to be afebrile x 24 hours before administration***, **Latex Free**  Fosphenytoin injection, ( CereBYX injection )  75 mg/pe, IV push, q8h  Indication: Seizures, Dilute each 100 mg PE (2 ml) dose in 2 ml D5W or NS and administer at 100 mg PE per minute.  Allergies:  Keppra: Hives, Itching  Vital  Signs: **Vital Signs.:   02-Oct-14 08:30  Vital Signs Type Recheck  Pulse Pulse 84  Respirations Respirations 26  Systolic BP Systolic BP 185  Diastolic BP (mmHg) Diastolic BP (mmHg) 34  Mean BP 56  Pulse Ox % Pulse Ox % 91  Pulse Ox Activity Level  At rest  Oxygen Delivery Room Air/ 21 %  Pulse Ox Heart Rate 82   EXAM: PHYSICAL EXAMINATION  GENERAL: Patient with head under blankets.  Says he doesnt want to come out from under blankets though he eventually does with some cajoling.  Normocephalic and atraumatic.  EYES: Funduscopic exam shows normal disc size, appearance and C/D ratio without clear evidence of papilledema.  CARDIOVASCULAR: S1 and S2 sounds are within normal limits, without murmurs, gallops, or rubs.  MUSCULOSKELETAL: Bulk - Normal Tone - Normal Pronator Drift - Absent bilaterally. Ambulation - Patient on bedrest in ICU so cannot test ambulation. Does not follow formal strength testing commands, but moves all 4 extremities, at least 4+ strength in all four extremities.  NEUROLOGICAL: MENTAL STATUS: Patient is oriented x1.  Recent and remote memory are both at least moderately decreased.  Attention span and concentration are decreased.  Naming,  repetition, comprehension and expressive speech are within normal limits.  Patient's fund of knowledge is reduced for educational level.  CRANIAL NERVES: Normal    CN II (normal visual acuity and visual fields) Normal    CN III, IV, VI (extraocular muscles are intact) Normal    CN V (facial sensation is intact bilaterally) Normal    CN VII (facial strength is intact bilaterally) Normal    CN VIII (hearing is intact bilaterally) Normal    CN IX/X (palate elevates midline, normal phonation) Normal    CN XI (shoulder shrug strength is normal and symmetric) Normal    CN XII (tongue protrudes midline)   SENSATION: Intact to pain and temp bilaterally (spinothalamic tracts) Intact to position and vibration bilaterally (dorsal columns)   REFLEXES: R/L 2+/2+    Biceps 2+/2+    Brachioradialis   2+/2+    Patellar 2+/2+    Achilles   COORDINATION/CEREBELLAR: Does not follow this command due to cognitive limitations..  Lab Results:  Hepatic:  01-Oct-14 22:51   Bilirubin, Total 0.8  Alkaline Phosphatase 117  SGPT (ALT)  10  SGOT (AST) 31  Total Protein, Serum 6.8  Albumin, Serum  1.8  Cardiology:  01-Oct-14 22:53   Ventricular Rate 82  Atrial Rate 82  P-R Interval 162  QRS Duration 92  QT 432  QTc 504  P Axis -5  R Axis -32  T Axis 34  ECG interpretation Normal sinus rhythm Left axis deviation Low voltage QRS Incomplete right bundle branch block Cannot rule out Anterior infarct , age undetermined Prolonged QT Abnormal ECG When compared with ECG of 02-Mar-2011 10:08, QT has lengthened ----------unconfirmed---------- Confirmed by OVERREAD, NOT ([id number]), editor PEARSON, BARBARA ([id number]) on 07/30/2013 3:13:37 PM  Routine Chem:  01-Oct-14 22:51   Ethanol, S. < 3  Ethanol % (comp) < 0.003 (Result(s) reported on 29 Jul 2013 at 11:37PM.)  02-Oct-14 00:52   Ammonia, Plasma 29 (Result(s) reported on 30 Jul 2013 at 01:41AM.)    06:30   Potassium, Serum  3.1  Result Comment Troponin  - RESULTS VERIFIED BY REPEAT TESTING.  - Notified Chauncey Witt on 07/30/13  - at [place]...qsd  - READ-BACK PROCESS PERFORMED.  Result(s) reported on 30 Jul 2013 at 09:00AM.  Glucose, Serum 97  BUN  2  Creatinine (comp) 0.80  Sodium, Serum  130  Chloride, Serum  91  CO2, Serum 31  Calcium (Total), Serum  6.5  Anion Gap 8  Osmolality (calc) 257  eGFR (African American) >60  eGFR (Non-African American) >60 (eGFR values <26m/min/1.73 m2 may be an indication of chronic kidney disease (CKD). Calculated eGFR is useful in patients with stable renal function. The eGFR calculation will not be reliable in acutely ill patients when serum creatinine is changing rapidly. It is not useful in  patients on dialysis. The eGFR calculation may not be applicable to patients at the low and high extremes of body sizes, pregnant women, and vegetarians.)    22:51   B-Type Natriuretic Peptide ([facility]  2535 (Result(s) reported on 30 Jul 2013 at 12:54AM.)  Magnesium, Serum  0.4 (1.8-2.4 THERAPEUTIC RANGE: 4-7 mg/dL TOXIC: > 10 mg/dL  -----------------------)  Cardiac:  02-Oct-14 06:30   CK, Total 138  CPK-MB, Serum 0.8 (Result(s) reported on 30 Jul 2013 at 08:56AM.)  Troponin I  0.10 (0.00-0.05 0.05 ng/mL or less: NEGATIVE  Repeat testing in 3-6 hrs  if clinically indicated. >0.05 ng/mL: POTENTIAL  MYOCARDIAL INJURY. Repeat  testing in 3-6 hrs if  clinically indicated. NOTE: An increase or decrease  of 30% or more on serial  testing suggests a  clinically important change)  Routine Hem:  01-Oct-14 22:51   WBC (CBC) 8.5  RBC (CBC)  3.48  Hemoglobin (CBC)  10.4  Hematocrit (CBC)  29.6  Platelet Count (CBC) 180 (Result(s) reported on 29 Jul 2013 at 11:42PM.)  MCV 85  MCH 30.0  MCHC 35.3  RDW 13.7   Radiology Results: CT:    01-Oct-14 23:51, CT Head Without Contrast  CT Head Without Contrast   REASON FOR EXAM:    seizure  COMMENTS:   May transport without cardiac monitor    PROCEDURE:  CT  - CT HEAD WITHOUT CONTRAST  - Jul 29 2013 11:51PM     RESULT: Comparison is made to the previous exam dated 03/02/2011. There is   prominence of the ventricles and sulci consistent with atrophy. There is   no intracranial hemorrhage, mass, mass effect or evolving infarct. There   is some low-attenuation in the periventricular and subcortical white   matter consistent with chronic microvascular ischemic disease. The   sinuses and mastoid air cells show normal appearing aeration. The   calvarium is intact.    IMPRESSION:   1. Atrophy with chronic microvascular ischemic disease. No acute     intracranial abnormality or significant interval change.    Dictation Site: 6        Verified By: GSundra Aland M.D., MD   Impression/Recommendations: Recommendations:   69year old man with a complex medical history presents with seizure at home.   with multiple severe electrolyte abnormalities.  This may explain his recent spate of seizures.  Patient on gabapentin monotherapy with clonazepam, neither of which are suitable monotherapy medications for partial seizures.  Uncertain seizure type.  Would recommend routine EEG and neurology evaluation as an outpatient to try to better characterize his potential seizure type to help aid in determining the best anticonvulsant medication for him.  Apparently with side effets on Keppra.  Agree after 3 seizures starting fosphenytoin load with lorazepam available PRN.  Electrolyte disturbances raises concern for poor nutrition.  Would address with patient's wife how he gets his nutrition and if unable to get it in currently social situation if he may benefit from a  higher care facility or with using calorie/nutrition shakes like Ensure.  At least moderate diffuse cerebral atrophy on HCT, personally viewed.  EKG shows NSR.   have reviewed the results of the most recent imaging studies, tests and labs as outlined above and answered all related questions.    Electronic Signatures: Anabel Bene (MD)  (Signed 02-Oct-14 23:49)  Authored: REFERRING PHYSICIAN, Primary Care Physician, Consult, History of Present Illness, Review of Systems, PAST MEDICAL/SURGICAL HISTORY, HOME MEDICATIONS, Current Medications, ALLERGIES, NURSING VITAL SIGNS, Physical Exam-, LAB RESULTS, RADIOLOGY RESULTS, Recommendations   Last Updated: 02-Oct-14 23:49 by Anabel Bene (MD)

## 2015-02-18 NOTE — Consult Note (Signed)
PATIENT NAME:  Stanley Nguyen, Stanley Nguyen MR#:  [id number] DATE OF BIRTH:  08-20-1946  DATE OF CONSULTATION:  08/02/2013  CONSULTING PHYSICIAN: Laurier NancyShaukat A. Clee Pandit, M.D.   INDICATION FOR CONSULTATION: Elevated troponin.   HISTORY OF PRESENT ILLNESS: This is a 69 year old white male with a past medical history of seizure disorder, which is chronic. 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 He has been seen by neurology has been started on treatment for it. He also had low magnesium and low potassium.   PAST MEDICAL HISTORY: History of left main disease, which was 40%, was treated medically. 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 History of neuropathy.   ALLERGIES: KEPPRA, HE BREAKS OUT WITH A RASH.   PHYSICAL EXAMINATION: GENERAL: He is alert, oriented x 3, in no acute distress.  VITAL SIGNS: Stable.  HEENT: No JVD.  LUNGS: Clear.  HEART: Regular rate, rhythm normal, S1, S2. No audible murmur.  ABDOMEN: Soft, nontender, positive bowel sounds.  EXTREMITIES: No pedal edema.  NEUROLOGICAL: The patient appears to be intact.   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  LABORATORY DATA: His troponin was 0.11, and also the follow-up was 0.09. Magnesium was low 1.2, potassium was 3.3. Initial magnesium was very low 0.4.   ASSESSMENT AND PLAN: Seizure disorder. May be related to electrolytes abnormalities, been seen by neurology and being treated with antiseizure medications. Elevated troponin is probably due to demand ischemia secondary to seizure event. Troponin is coming down. We will watch the patient, advise correcting the  electrolyte disorder and we will follow closely with you. Thank you very much for the referral.   ____________________________ Laurier NancyShaukat A. Verginia Toohey, MD sak:sg D: 08/02/2013 12:07:17 ET T: 08/02/2013 12:40:19 ET JOB#: [id number]  cc: Laurier NancyShaukat A. Brenna Friesenhahn, MD, <Dictator> Laurier NancySHAUKAT A Milaya Hora MD ELECTRONICALLY SIGNED 08/24/2013 15:18

## 2015-02-18 NOTE — Discharge Summary (Signed)
PATIENT NAME:  Stanley Nguyen, Stanley Nguyen MR#:  [id number] DATE OF BIRTH:  09-02-1946  DATE OF ADMISSION:  07/30/2013 DATE OF DISCHARGE:  08/04/2013  ADMITTING PHYSICIAN: Stanley LabAruna Gouru, MD  DISCHARGING PHYSICIAN:  Stanley FloodSheikh A. Ellsworth Lennoxejan-Sie, MD  PROCEDURES: None.   IMAGING: CT of the head which was negative for acute intracranial abnormality. Chest x-ray x 2 which noted bilateral pneumonia.   DISCHARGE DIAGNOSES: Seizure disorder, atrial fibrillation, history of chronic obstructive pulmonary disease.   CONSULTATION: Neurology.   DISCHARGE MEDICINES: Vimpat 50 mg twice a day was added to his medications. The remainder of his home medications were unchanged. Please refer to the discharge medical reconciliation for details.   HOSPITAL COURSE: This gentleman was admitted through the Emergency Room where his wife reported a seizure at home, the first one in several years. The patient had discontinued his  anticonvulsants secondary to that. He presented to the ED with altered mental status, namely postictal confusion. He was loaded with fosphenytoin and switched to oral phenytoin. The patient, however, was on Xarelto for his atrial fibrillation prophylaxis and the phenytoin interfered with Xarelto metabolism as it is metabolized in the CYP450 pathway. I consulted neurology, Dr. Pauletta BrownsYuriy Nguyen, who recommended addition of Vimpat given that that medication is not metabolized via that pathway. The patient tolerated that medication without any side effects.  He was informed that he needs to continue that medication solely to prevent any further seizures. His mental status continued to improve during his stay and he was discharged to home in satisfactory condition. The patient'Stanley Nguyen other comorbidities remained stable during his stay.   DISCHARGE INSTRUCTIONS:  DIET: Low sodium.  ACTIVITY: As tolerated.  FOLLOWUP: Dr. Ellsworth Nguyen in 1 to 2 weeks and neurology in 1 to 2 weeks.   DISCHARGE PROCESS TIME SPENT: 32 minutes.     ____________________________ Stanley FloodSheikh A. Ellsworth Lennoxejan-Sie, MD sat:cs D: 08/19/2013 14:08:00 ET T: 08/19/2013 15:00:34 ET JOB#: [id number]  cc: Stanley McalpineSheikh A. Ellsworth Lennoxejan-Sie, MD, <Dictator> Stanley GaribaldiSHEIKH A TEJAN-SIE MD ELECTRONICALLY SIGNED 08/21/2013 13:47

## 2015-02-18 NOTE — Consult Note (Signed)
PATIENT NAME:  Stanley Nguyen, Stanley Nguyen MR#:  [id number] DATE OF BIRTH:  03/09/1946  DATE OF CONSULTATION:  08/01/2013  CONSULTING PHYSICIAN:  Pauletta BrownsYuriy Aliviana Burdell, MD  REASON FOR EVALUATION:  History of seizure.  REPORT OF CONSULTATION: A 69 year old male with past medical history of seizure disorder, appears he stopped his antiepileptic medication. Last seizure history about 2 to 3 years ago, prior to the one on admission, the patient was on Keppra, but stopped it because it gave him a rash. On presentation, patient became stiff, started jerking, and seizure lasted for a minute. The patient received Ativan. The patient also received Dilantin in the hospital.   PAST MEDICAL HISTORY:  Coronary artery disease, seizure disorder, diabetes that is diet-controlled, peripheral vascular disease, COPD, hyperlipidemia.   HOME MEDICATIONS: Include Plavix, Prinivil, oxycodone, Nitrostat, Neurontin, Lipitor, Klonopin, aspirin, amlodipine, Ambien, oxycodone.   PHYSICAL EXAMINATION: NEUROLOGICAL EVALUATION: The patient does not appear to be in distress. Follows minimal commands. Able to show me his right thumb. Able to stick out his tongue. Cranial nerve examination:  Pupils 3 mm to 2 mm, equal, reactive. No facial droop found. Tongue is midline. Strength: The patient is able to push me and pull without any significant weakness. Strength appears to be 5-/5 symmetrical bilaterally. Reflexes 2+ symmetrically. Sensation appears to be intact, as he withdraws from painful stimuli. Coordination could not be assessed. Gait could not be assessed   IMPRESSION: Stanley Nguyen is here because of recurrent seizure episode. Last episode 2 to 3 years ago. I am asked to evaluate the patient, as patient was started on Dilantin, but he is on Xarelto, and Dilantin induces P-450 and would not be a good medication with Xarelto. Plan discussed with the primary team. The patient was started on Vimpat 50 mg b.i.d., which he should continue at home. Not on  Keppra because he states it gave him a rash. Otherwise, appears to be close to baseline. No further neurological imaging at this time.   Thank you, it was a pleasure seeing this patient.     ____________________________ Pauletta BrownsYuriy Lilyahna Sirmon, MD yz:mr D: 08/01/2013 19:21:32 ET T: 08/01/2013 19:44:56 ET JOB#: [id number]  cc: Pauletta BrownsYuriy Tiombe Tomeo, MD, <Dictator> Pauletta BrownsYURIY Adela Esteban MD ELECTRONICALLY SIGNED 08/24/2013 9:55

## 2015-02-18 NOTE — H&P (Signed)
PATIENT NAME:  Stanley Nguyen, Stanley Nguyen MR#:  [id number] DATE OF BIRTH:  May 06, 1946  DATE OF ADMISSION:  07/30/2013  PRIMARY CARE PHYSICIAN:  Dr. Ellsworth Lennox  CHIEF COMPLAINT: Seizures.   HISTORY OF PRESENT ILLNESS: The patient is a 69 year old Caucasian male with a past medical history of chronic seizures, diet-controlled diabetes mellitus, peripheral vascular disease, COPD, hyperlipidemia, coronary artery disease, questionable history of cirrhosis, chronic leg pain regarding which he sees pain management doctor on a regular basis, was brought into the ER after he had an one episode of seizure at home. According to the patient's wife, who is at bedside, the patient walks a few steps up to the bathroom on a regular basis and uses an electric scooter for long distances. He sees pain management doctor regarding chronic pain disorder. The patient has chronic history of seizures and the last seizure was approximately 2 to 3 years ago. As patient was breaking out and developing rashes from Keppra, it was changed to Neurontin by his primary care physician.  Yesterday evening, at around 8:00 p.m., the dog was barking and patient was found on the floor. The patient was complaining of shortness of breath. Wife picked him up and put him on the couch, then immediately he became stiff and started jerking his upper and lower extremities. Again, he fell on the floor. This episode of jerking movements lasted for approximately 1 minute. Following this, the patient had fecal and urinary incontinence. Wife called EMS and by the time EMS arrived there, the patient was lethargic and was post ictal.  The patient's wife is reporting that he did not roll his eyes or bite his tongue. The patient was brought into the ER and in the ER at around 11:00 p.m. and then again at 11:55 p.m. yesterday, he had another two episodes of seizures, which were lasting for less than 1 minute approximately. He was given Ativan IV in the ER. The patient's CAT scan of  the head with no acute findings. EKG had a normal sinus rhythm. The patient's electrolytes are significantly abnormal with magnesium being very low at 0.4, sodium at 129 and  potassium at 2.6. The patient's calcium is low at 7.1 as well, but albumin is at 1.8. The patient has had 2 grams of magnesium sulfate IV in the ER. He was also given 1 liter of fluid bolus and 40 mEq of potassium IV. Hospitalist team is called to admit the patient. During my examination, the patient is still lethargic and mumbling, responds to his name. According to the wife, prior to the first episode of seizures, he was complaining of some abdominal bloating, but no nausea, vomiting or diarrhea. The patient is on Xarelto for the chronic DVT.  Wife is reporting that he stopped drinking several years ago, and recently he has been not drinking.   PAST MEDICAL HISTORY: Coronary artery disease, seizures, diabetes mellitus diet-controlled, peripheral vascular disease, COPD,  hyperlipidemia, hemochromatosis, questionable history of cirrhosis, chronic leg pain; sees pain management doctor, neuropathy.    PAST SURGICAL HISTORY:  Anterior vena cava filter, hernia repair surgery, iliac stents.   ALLERGIES:  THE PATIENT IS ALLERGIC TO KEPPRA, BREAKS OUT INTO RASH.    PSYCHOSOCIAL HISTORY: Lives at home with wife. He used to drink, but quit drinking several years ago. Still smokes half to 1 pack a day and history supported by the patient's wife.   FAMILY HISTORY: Mom died at age 61 from CVA.  Brother died of myocardial infarction. Father died at age  65 from MI.    HOME MEDICATIONS:  Spiriva 18 mcg inhalation via HandiHaler, Plavix 75 mg once daily, Prinivil 40 mg once daily, OxyContin 10 mg 1 tablet p.o. q12 hours, Nitrostat as needed, Neurontin 300 mg 3 times a day, Lipitor 80 mg once daily, Klonopin 0.5 mg 2 to 4 tablets p.o. on daily basis, aspirin 81 mg 4 tablets once a day, amiodarone 200 mg once daily, Ambien 10 mg 1 tablet by mouth at  bedtime for insomnia, oxycodone 5 mg 2 to 3 capsules p.o. on daily basis.   REVIEW OF SYSTEMS:  Unobtainable, as the patient is still lethargic, probably from postictal phase.   PHYSICAL EXAMINATION: VITAL SIGNS: Temperature 98 degrees Fahrenheit, pulse 81, respirations 18, blood pressure 125/72, pulse oximetry is 95% on 2 liters.  GENERAL APPEARANCE: Not in acute distress,  moderately built and obese.  HEENT: Normocephalic. Pupils are equal, reacting to light and accommodation. No conjunctival injection. No maxillary tenderness. No nasal discharge. No discharge from the ears, external ear canals are intact, somewhat dry mucous membranes.  NECK: Supple. No JVD. No thyromegaly.  LUNGS: Clear to auscultation bilaterally. No accessory muscle use and no anterior chest wall tenderness on palpation.  HEART: S1 and S2 normal. Regular rate and rhythm.  GASTROINTESTINAL: Soft. Bowel sounds are positive in all 4 quadrants. Obese, nontender, nondistended. No masses felt. No hepatosplenomegaly.  NEUROLOGICAL: The patient is lethargic, arousable, but mumbling, not answering any questions, not following verbal commands. Motor and sensory could not be elicited. Reflexes are 2+.  EXTREMITIES: Trace edema. No cyanosis. No clubbing.  MUSCULOSKELETAL: No joint effusion, tenderness, erythema  PSYCHIATRIC: Mood and affect could not be elicited because of the patient's lethargy.  SKIN: Warm to touch. Normal turgor. No rashes. No lesions.   LABORATORY AND IMAGING STUDIES:  12-Lead EKG: Normal sinus rhythm with left axis deviation, incomplete right bundle branch block. Chest x-ray: No air, no pulmonary edema. No acute findings. CAT scan of the head: No acute findings.   LFTs: total protein 6.8, albumin 1.8, total bilirubin 0.8, alkaline phosphatase 117, AST 31, ALT 10. Magnesium is less than 0.4, serum ammonia 29, alcohol level less than 3. Calcium 7.1. Serum osmolality 255, glucose 93, BUN 2, creatinine 0.8, sodium  129, potassium 3.6, chloride 90. GFR greater than 60. Accu-Chek 114, BNP elevated at 2735. White count is 8.3, hemoglobin 10.4, hematocrit 29.6,  platelets 187, pH 7.42, pCO2 57.   ASSESSMENT AND PLAN: A 69 year old Caucasian male presenting to the ER after he had 1 episode of seizure at home and 2 episodes of breakthrough seizures in the ER. Will be admitted to CCU with the following assessment and plan:  1.  Altered mental status, secondary to recurrent seizures and probably postictal phase with a past medical history of seizures; Plan: Admit him to Critical Care Unit. We will get neuro checks. Neurological consultation with Dr. Malvin Johns, who is on call.  Loading dose of fosphenytoin 1 gram was given in the ER. Will continue fosphenytoin, pharmacy to dose. Ativan as needed basis. Replace electrolytes. CT head is negative.  2.  Severe hypomagnesemia, etiology unclear. The patient stopped drinking several years ago. 2 grams of magnesium sulfate was ordered in the ER and I will provide another 2 grams of magnesium sulfate and check a.m. labs.  3.  Hypokalemia. 40 mEq of IV potassium was given in the ER. Probably, this is from hypomagnesemia. Will replete both potassium and magnesium.   4.  Hyponatremia. The patient is receiving  IV fluid bolus. We will repeat a.m. labs, and, if necessary, we will consider nephrology consult.  5.   We will provide him Ativan as needed basis for breakthrough seizures.  6.  Chronic pain syndrome. Currently, the patient is lethargic, holding pain medications and all other home medications.  7.   Chronic history of deep venous thrombosis, on Xarelto. The patient being n.p.o. will provide him with Lovenox 40 mg subcutaneous daily until the patient is more awake and alert.  8.  Chronic history of coronary artery disease. We will hold off on medication. The patient denies any chest pain or any other pain according to the wife. 9.   Chronic history of diabetes mellitus, diet  controlled. Will put him on sliding scale insulin.  10.  Chronic obstructive pulmonary disease exacerbation. We will provide him nebulizer treatments as needed basis for shortness of breath and wheezing.  11.  Will provide gastrointestinal prophylaxis and deep vein thrombosis prophylaxis with Lovenox subcutaneous - patient's wife has reported that the patient did not have any recent history of gastrointestinal bleed or any other bleeds.  12.    Full code. Wife is medical power of attorney. Diagnosis and plan of care was discussed in detail with the patient's wife at bedside. She verbalized understanding of the plan.  13.    Will transfer the patient to Dr. Ellsworth Lennoxejan-Sie in a.m.   TOTAL CRITICAL CARE TIME SPENT:  60 minutes.     ____________________________ Ramonita LabAruna Dale Strausser, MD ag:nts D: 07/30/2013 05:30:13 ET T: 07/30/2013 05:56:31 ET JOB#: [id number]  cc: Ramonita LabAruna Tasheka Houseman, MD, <Dictator> Ramonita LabARUNA Treyden Hakim MD ELECTRONICALLY SIGNED 07/31/2013 6:52

## 2016-10-29 DIAGNOSIS — M869 Osteomyelitis, unspecified: Secondary | ICD-10-CM

## 2016-10-29 HISTORY — DX: Osteomyelitis, unspecified: M86.9

## 2017-07-10 ENCOUNTER — Emergency Department: Payer: Medicare HMO

## 2017-07-10 ENCOUNTER — Inpatient Hospital Stay
Admission: EM | Admit: 2017-07-10 | Discharge: 2017-07-19 | DRG: 496 | Disposition: A | Discharge status: Skilled Nursing Facility | Payer: Medicare HMO | Attending: Internal Medicine | Admitting: Internal Medicine

## 2017-07-10 ENCOUNTER — Encounter: Payer: Self-pay | Admitting: *Deleted

## 2017-07-10 DIAGNOSIS — E11621 Type 2 diabetes mellitus with foot ulcer: Secondary | ICD-10-CM | POA: Diagnosis present

## 2017-07-10 DIAGNOSIS — I482 Chronic atrial fibrillation: Secondary | ICD-10-CM | POA: Diagnosis present

## 2017-07-10 DIAGNOSIS — K7460 Unspecified cirrhosis of liver: Secondary | ICD-10-CM | POA: Diagnosis present

## 2017-07-10 DIAGNOSIS — A4901 Methicillin susceptible Staphylococcus aureus infection, unspecified site: Secondary | ICD-10-CM | POA: Diagnosis present

## 2017-07-10 DIAGNOSIS — J449 Chronic obstructive pulmonary disease, unspecified: Secondary | ICD-10-CM | POA: Diagnosis present

## 2017-07-10 DIAGNOSIS — M86171 Other acute osteomyelitis, right ankle and foot: Secondary | ICD-10-CM | POA: Diagnosis present

## 2017-07-10 DIAGNOSIS — G40909 Epilepsy, unspecified, not intractable, without status epilepticus: Secondary | ICD-10-CM | POA: Diagnosis present

## 2017-07-10 DIAGNOSIS — L97514 Non-pressure chronic ulcer of other part of right foot with necrosis of bone: Secondary | ICD-10-CM | POA: Diagnosis present

## 2017-07-10 DIAGNOSIS — N179 Acute kidney failure, unspecified: Secondary | ICD-10-CM | POA: Diagnosis not present

## 2017-07-10 DIAGNOSIS — Y798 Miscellaneous orthopedic devices associated with adverse incidents, not elsewhere classified: Secondary | ICD-10-CM | POA: Diagnosis present

## 2017-07-10 DIAGNOSIS — T8469XA Infection and inflammatory reaction due to internal fixation device of other site, initial encounter: Secondary | ICD-10-CM | POA: Diagnosis present

## 2017-07-10 DIAGNOSIS — E860 Dehydration: Secondary | ICD-10-CM | POA: Diagnosis present

## 2017-07-10 DIAGNOSIS — E1169 Type 2 diabetes mellitus with other specified complication: Secondary | ICD-10-CM | POA: Diagnosis present

## 2017-07-10 DIAGNOSIS — E1151 Type 2 diabetes mellitus with diabetic peripheral angiopathy without gangrene: Secondary | ICD-10-CM | POA: Diagnosis present

## 2017-07-10 DIAGNOSIS — F17210 Nicotine dependence, cigarettes, uncomplicated: Secondary | ICD-10-CM | POA: Diagnosis present

## 2017-07-10 DIAGNOSIS — Z79891 Long term (current) use of opiate analgesic: Secondary | ICD-10-CM

## 2017-07-10 DIAGNOSIS — Z888 Allergy status to other drugs, medicaments and biological substances status: Secondary | ICD-10-CM | POA: Diagnosis not present

## 2017-07-10 DIAGNOSIS — I2510 Atherosclerotic heart disease of native coronary artery without angina pectoris: Secondary | ICD-10-CM | POA: Diagnosis not present

## 2017-07-10 DIAGNOSIS — I70238 Atherosclerosis of native arteries of right leg with ulceration of other part of lower right leg: Secondary | ICD-10-CM | POA: Diagnosis not present

## 2017-07-10 DIAGNOSIS — G894 Chronic pain syndrome: Secondary | ICD-10-CM | POA: Diagnosis present

## 2017-07-10 DIAGNOSIS — Z7901 Long term (current) use of anticoagulants: Secondary | ICD-10-CM | POA: Diagnosis not present

## 2017-07-10 DIAGNOSIS — L97519 Non-pressure chronic ulcer of other part of right foot with unspecified severity: Secondary | ICD-10-CM | POA: Diagnosis not present

## 2017-07-10 DIAGNOSIS — E871 Hypo-osmolality and hyponatremia: Secondary | ICD-10-CM | POA: Diagnosis present

## 2017-07-10 DIAGNOSIS — F419 Anxiety disorder, unspecified: Secondary | ICD-10-CM | POA: Diagnosis present

## 2017-07-10 DIAGNOSIS — D638 Anemia in other chronic diseases classified elsewhere: Secondary | ICD-10-CM | POA: Diagnosis present

## 2017-07-10 DIAGNOSIS — I70235 Atherosclerosis of native arteries of right leg with ulceration of other part of foot: Secondary | ICD-10-CM | POA: Diagnosis present

## 2017-07-10 DIAGNOSIS — I70213 Atherosclerosis of native arteries of extremities with intermittent claudication, bilateral legs: Secondary | ICD-10-CM | POA: Diagnosis not present

## 2017-07-10 DIAGNOSIS — I4891 Unspecified atrial fibrillation: Secondary | ICD-10-CM | POA: Diagnosis not present

## 2017-07-10 HISTORY — DX: Unspecified cirrhosis of liver: K74.60

## 2017-07-10 HISTORY — DX: Chronic obstructive pulmonary disease, unspecified: J44.9

## 2017-07-10 HISTORY — DX: Atherosclerotic heart disease of native coronary artery without angina pectoris: I25.10

## 2017-07-10 HISTORY — DX: Epilepsy, unspecified, not intractable, without status epilepticus: G40.909

## 2017-07-10 HISTORY — DX: Unspecified atrial fibrillation: I48.91

## 2017-07-10 LAB — CBC WITH DIFFERENTIAL/PLATELET
Basophils Absolute: 0.2 10*3/uL — ABNORMAL HIGH (ref 0–0.1)
Basophils Relative: 1 %
EOS ABS: 0.2 10*3/uL (ref 0–0.7)
EOS PCT: 1 %
HCT: 34.9 % — ABNORMAL LOW (ref 40.0–52.0)
Hemoglobin: 12.1 g/dL — ABNORMAL LOW (ref 13.0–18.0)
LYMPHS ABS: 1.6 10*3/uL (ref 1.0–3.6)
LYMPHS PCT: 10 %
MCH: 31.1 pg (ref 26.0–34.0)
MCHC: 34.7 g/dL (ref 32.0–36.0)
MCV: 89.7 fL (ref 80.0–100.0)
MONO ABS: 1.2 10*3/uL — AB (ref 0.2–1.0)
MONOS PCT: 8 %
Neutro Abs: 12.7 10*3/uL — ABNORMAL HIGH (ref 1.4–6.5)
Neutrophils Relative %: 80 %
PLATELETS: 310 10*3/uL (ref 150–440)
RBC: 3.89 MIL/uL — ABNORMAL LOW (ref 4.40–5.90)
RDW: 14.5 % (ref 11.5–14.5)
WBC: 15.9 10*3/uL — AB (ref 3.8–10.6)

## 2017-07-10 LAB — COMPREHENSIVE METABOLIC PANEL
ALBUMIN: 2.5 g/dL — AB (ref 3.5–5.0)
ALT: 14 U/L — AB (ref 17–63)
AST: 24 U/L (ref 15–41)
Alkaline Phosphatase: 86 U/L (ref 38–126)
Anion gap: 8 (ref 5–15)
BUN: 12 mg/dL (ref 6–20)
CHLORIDE: 98 mmol/L — AB (ref 101–111)
CO2: 25 mmol/L (ref 22–32)
CREATININE: 1.04 mg/dL (ref 0.61–1.24)
Calcium: 9.1 mg/dL (ref 8.9–10.3)
GFR calc Af Amer: >60 mL/min (ref >60)
GLUCOSE: 109 mg/dL — AB (ref 65–99)
Potassium: 4.4 mmol/L (ref 3.5–5.1)
Sodium: 131 mmol/L — ABNORMAL LOW (ref 135–145)
Total Bilirubin: 1 mg/dL (ref 0.3–1.2)
Total Protein: 8.5 g/dL — ABNORMAL HIGH (ref 6.5–8.1)

## 2017-07-10 LAB — PROTIME-INR
INR: 1.48
Prothrombin Time: 17.8 seconds — ABNORMAL HIGH (ref 11.4–15.2)

## 2017-07-10 LAB — SEDIMENTATION RATE: Sed Rate: 92 mm/h — ABNORMAL HIGH (ref 0–20)

## 2017-07-10 LAB — SURGICAL PCR SCREEN
MRSA, PCR: NEGATIVE
Staphylococcus aureus: POSITIVE — AB

## 2017-07-10 LAB — APTT: APTT: 37 s — AB (ref 24–36)

## 2017-07-10 MED ORDER — PIPERACILLIN-TAZOBACTAM 3.375 G IVPB
3.3750 g | Freq: Three times a day (TID) | INTRAVENOUS | Status: DC
  Administered 2017-07-10 – 2017-07-12 (×5): 3.375 g via INTRAVENOUS
  Filled 2017-07-10 (×4): qty 50

## 2017-07-10 MED ORDER — MORPHINE SULFATE (PF) 2 MG/ML IV SOLN
2.0000 mg | INTRAVENOUS | Status: DC | PRN
  Administered 2017-07-10 – 2017-07-18 (×10): 2 mg via INTRAVENOUS
  Filled 2017-07-10 (×9): qty 1

## 2017-07-10 MED ORDER — HEPARIN BOLUS VIA INFUSION
4200.0000 [IU] | Freq: Once | INTRAVENOUS | Status: AC
  Administered 2017-07-10: 4200 [IU] via INTRAVENOUS
  Filled 2017-07-10: qty 4200

## 2017-07-10 MED ORDER — FENTANYL CITRATE (PF) 100 MCG/2ML IJ SOLN
50.0000 ug | Freq: Once | INTRAMUSCULAR | Status: AC
  Administered 2017-07-10: 50 ug via INTRAVENOUS
  Filled 2017-07-10: qty 2

## 2017-07-10 MED ORDER — SODIUM CHLORIDE 0.9 % IV SOLN
INTRAVENOUS | Status: DC
  Administered 2017-07-10: 22:00:00 via INTRAVENOUS

## 2017-07-10 MED ORDER — VANCOMYCIN HCL IN DEXTROSE 1-5 GM/200ML-% IV SOLN
1000.0000 mg | Freq: Two times a day (BID) | INTRAVENOUS | Status: DC
  Administered 2017-07-11 – 2017-07-12 (×3): 1000 mg via INTRAVENOUS
  Filled 2017-07-10 (×5): qty 200

## 2017-07-10 MED ORDER — NICOTINE 21 MG/24HR TD PT24
21.0000 mg | MEDICATED_PATCH | Freq: Every day | TRANSDERMAL | Status: DC
  Administered 2017-07-10 – 2017-07-19 (×10): 21 mg via TRANSDERMAL
  Filled 2017-07-10 (×10): qty 1

## 2017-07-10 MED ORDER — MORPHINE SULFATE (PF) 2 MG/ML IV SOLN
INTRAVENOUS | Status: AC
  Administered 2017-07-11: 2 mg via INTRAVENOUS
  Filled 2017-07-10: qty 1

## 2017-07-10 MED ORDER — ONDANSETRON HCL 4 MG/2ML IJ SOLN: 4.0000 mg | Freq: Four times a day (QID) | INTRAMUSCULAR | Status: DC | PRN

## 2017-07-10 MED ORDER — VANCOMYCIN HCL IN DEXTROSE 1-5 GM/200ML-% IV SOLN
1000.0000 mg | Freq: Once | INTRAVENOUS | Status: AC
  Administered 2017-07-10: 1000 mg via INTRAVENOUS
  Filled 2017-07-10: qty 200

## 2017-07-10 MED ORDER — HYDROCODONE-ACETAMINOPHEN 5-325 MG PO TABS
1.0000 | ORAL_TABLET | ORAL | Status: DC | PRN
  Administered 2017-07-10 – 2017-07-14 (×8): 2 via ORAL
  Administered 2017-07-15: 1 via ORAL
  Filled 2017-07-10 (×3): qty 2
  Filled 2017-07-10: qty 1
  Filled 2017-07-10 (×5): qty 2

## 2017-07-10 MED ORDER — BUSPIRONE HCL 5 MG PO TABS
7.5000 mg | ORAL_TABLET | Freq: Two times a day (BID) | ORAL | Status: DC
  Administered 2017-07-10 – 2017-07-19 (×16): 7.5 mg via ORAL
  Filled 2017-07-10: qty 1
  Filled 2017-07-10 (×18): qty 1.5

## 2017-07-10 MED ORDER — ACETAMINOPHEN 325 MG PO TABS: 650.0000 mg | ORAL_TABLET | Freq: Four times a day (QID) | ORAL | Status: DC | PRN

## 2017-07-10 MED ORDER — HEPARIN (PORCINE) IN NACL 100-0.45 UNIT/ML-% IJ SOLN
1100.0000 [IU]/h | INTRAMUSCULAR | Status: AC
  Administered 2017-07-10: 1050 [IU]/h via INTRAVENOUS
  Administered 2017-07-11: 1100 [IU]/h via INTRAVENOUS
  Filled 2017-07-10 (×2): qty 250

## 2017-07-10 MED ORDER — LACOSAMIDE 50 MG PO TABS
50.0000 mg | ORAL_TABLET | Freq: Two times a day (BID) | ORAL | Status: DC
  Administered 2017-07-10 – 2017-07-19 (×17): 50 mg via ORAL
  Filled 2017-07-10 (×17): qty 1

## 2017-07-10 MED ORDER — GABAPENTIN 300 MG PO CAPS
300.0000 mg | ORAL_CAPSULE | Freq: Three times a day (TID) | ORAL | Status: DC
  Administered 2017-07-10 – 2017-07-19 (×24): 300 mg via ORAL
  Filled 2017-07-10 (×24): qty 1

## 2017-07-10 MED ORDER — TIOTROPIUM BROMIDE MONOHYDRATE 18 MCG IN CAPS
1.0000 | ORAL_CAPSULE | Freq: Every day | RESPIRATORY_TRACT | Status: DC
  Administered 2017-07-11 – 2017-07-19 (×8): 18 ug via RESPIRATORY_TRACT
  Filled 2017-07-10 (×2): qty 5

## 2017-07-10 MED ORDER — ONDANSETRON HCL 4 MG PO TABS: 4.0000 mg | ORAL_TABLET | Freq: Four times a day (QID) | ORAL | Status: DC | PRN

## 2017-07-10 MED ORDER — PIPERACILLIN-TAZOBACTAM 3.375 G IVPB 30 MIN
INTRAVENOUS | Status: AC
  Filled 2017-07-10: qty 50

## 2017-07-10 MED ORDER — SOTALOL HCL 80 MG PO TABS
80.0000 mg | ORAL_TABLET | Freq: Every day | ORAL | Status: DC
  Administered 2017-07-11 – 2017-07-19 (×9): 80 mg via ORAL
  Filled 2017-07-10 (×9): qty 1

## 2017-07-10 MED ORDER — OXYCODONE HCL ER 10 MG PO T12A
30.0000 mg | EXTENDED_RELEASE_TABLET | Freq: Three times a day (TID) | ORAL | Status: DC
  Administered 2017-07-10 – 2017-07-19 (×25): 30 mg via ORAL
  Filled 2017-07-10 (×2): qty 2
  Filled 2017-07-10 (×2): qty 3
  Filled 2017-07-10 (×5): qty 2
  Filled 2017-07-10 (×2): qty 3
  Filled 2017-07-10: qty 2
  Filled 2017-07-10: qty 3
  Filled 2017-07-10 (×12): qty 2

## 2017-07-10 MED ORDER — PIPERACILLIN-TAZOBACTAM 3.375 G IVPB 30 MIN
3.3750 g | Freq: Once | INTRAVENOUS | Status: AC
  Administered 2017-07-10: 3.375 g via INTRAVENOUS

## 2017-07-10 MED ORDER — ZOLPIDEM TARTRATE 5 MG PO TABS
5.0000 mg | ORAL_TABLET | Freq: Every day | ORAL | Status: DC
  Administered 2017-07-10 – 2017-07-18 (×9): 5 mg via ORAL
  Filled 2017-07-10 (×9): qty 1

## 2017-07-10 MED ORDER — ACETAMINOPHEN 650 MG RE SUPP: 650.0000 mg | Freq: Four times a day (QID) | RECTAL | Status: DC | PRN

## 2017-07-10 NOTE — H&P (Signed)
[facility] [place] NAME: Stanley Nguyen    MR#:  [id number]  DATE OF BIRTH:  01-Nov-1945  DATE OF ADMISSION:  07/10/2017  PRIMARY CARE PHYSICIAN: Jodi Marble, MD   REQUESTING/REFERRING PHYSICIAN: [place] infection   Chief Complaint  Patient presents with  . Wound Infection    HISTORY OF PRESENT ILLNESS:  Stanley Nguyen  is a 71 y.o. male with a known history of atrial fibrillation, seizure disorder, anxiety came in because of severe pain in the right foot associated with purulent drainage. Patient has foot pain, pus coming out of the right foot for past 1 week. He has history of hand-foot-and-mouth disease. Noted to have left foot infection as well but that seems to be healing. Noted to have osteomyelitis of  first metatarsal.  PAST MEDICAL HISTORY:  History reviewed. No pertinent past medical history.  PAST SURGICAL HISTOIRY:  History reviewed. No pertinent surgical history.  SOCIAL HISTORY:   Social History  Substance Use Topics  . Smoking status: Current Every Day Smoker    Packs/day: 1.00    Types: Cigarettes  . Smokeless tobacco: Never Used  . Alcohol use No    FAMILY HISTORY:  History reviewed. No pertinent family history.  DRUG ALLERGIES:  No Known Allergies  REVIEW OF SYSTEMS:  CONSTITUTIONAL: No fever, fatigue or weakness.  EYES: No blurred or double vision.  EARS, NOSE, AND THROAT: No tinnitus or ear pain.  RESPIRATORY: No cough, shortness of breath, wheezing or hemoptysis.  CARDIOVASCULAR: No chest pain, orthopnea, edema.  GASTROINTESTINAL: No nausea, vomiting, diarrhea or abdominal pain.  GENITOURINARY: No dysuria, hematuria.  ENDOCRINE: No polyuria, nocturia,  HEMATOLOGY: No anemia, easy bruising or bleeding SKIN: No rash or lesion. MUSCULOSKELETAL: Full in drainage, redness of the right foot and exposed hardware.  EUROLOGIC: No tingling, numbness, weakness.  PSYCHIATRY: No  anxiety or depression.   MEDICATIONS AT HOME:   Prior to Admission medications   Medication Sig Start Date End Date Taking? Authorizing Provider  busPIRone (BUSPAR) 7.5 MG tablet Take 7.5 mg by mouth 2 (two) times daily. 07/09/17  Yes [provider]  celecoxib (CELEBREX) 200 MG capsule Take 200 mg by mouth daily. 07/02/17  Yes [provider]  gabapentin (NEURONTIN) 300 MG capsule Take 300 mg by mouth 3 (three) times daily. 07/09/17  Yes [provider]  OXYCONTIN 30 MG 12 hr tablet Take 1 tablet by mouth every 8 (eight) hours. 07/02/17  Yes [provider]  sotalol (BETAPACE) 80 MG tablet Take 80 mg by mouth daily. 07/02/17  Yes [provider]  SPIRIVA HANDIHALER 18 MCG inhalation capsule Place 1 capsule into inhaler and inhale daily. 07/03/17  Yes [provider]  VIMPAT 50 MG TABS tablet Take 50 mg by mouth 2 (two) times daily. 07/02/17  Yes [provider]  XARELTO 20 MG TABS tablet Take 20 mg by mouth daily. 06/24/17  Yes [provider]  zolpidem (AMBIEN) 10 MG tablet Take 10 mg by mouth at bedtime. 06/25/17  Yes [provider]      VITAL SIGNS:  Blood pressure (!) 112/51, pulse 64, temperature 97.6 F (36.4 C), temperature source Oral, resp. rate 18, height _0  (1.778 m), weight 70.7 kg (155 lb 14.4 oz), SpO2 96 %.  PHYSICAL EXAMINATION:  GENERAL:  71 y.o.-year-old patient lying in the bed with no acute distress.  EYES: Pupils equal, round, reactive to light  a No scleral icterus. Extraocular  muscles intact.  HEENT: Head atraumatic, normocephalic. Oropharynx and nasopharynx clear.  NECK:  Supple, no jugular venous distention. No thyroid enlargement, no tenderness.  LUNGS: Normal breath sounds bilaterally, no wheezing, rales,rhonchi or crepitation. No use of accessory muscles of respiration.  CARDIOVASCULAR: S1, S2 normal. No murmurs, rubs, or gallops.  ABDOMEN: Soft, nontender, nondistended. Bowel sounds  present. No organomegaly or mass.  EXTREMITIES: Right foot shows very deep necrotic appearing infection on the dorsum and a stent to palpation. Also patient has visible hard ware.  NEUROLOGIC: Cranial nerves II through XII are intact. PSYCHIATRIC: The patient is alert and oriented x 3.  SKIN: No obvious rash, lesion, or ulcer.   LABORATORY PANEL:   CBC  Recent Labs Lab 07/10/17 1630  WBC 15.9*  HGB 12.1*  HCT 34.9*  PLT 310   ------------------------------------------------------------------------------------------------------------------  Chemistries   Recent Labs Lab 07/10/17 1630  NA 131*  K 4.4  CL 98*  CO2 25  GLUCOSE 109*  BUN 12  CREATININE 1.04  CALCIUM 9.1  AST 24  ALT 14*  ALKPHOS 86  BILITOT 1.0   ------------------------------------------------------------------------------------------------------------------  Cardiac Enzymes No results for input(s): TROPONINI in the last 168 hours. ------------------------------------------------------------------------------------------------------------------  RADIOLOGY:  Dg Foot Complete Right  Result Date: 07/10/2017 CLINICAL DATA:  Evaluate for osteomyelitis. EXAM: RIGHT FOOT COMPLETE - 3+ VIEW COMPARISON:  Mar 04, 2006. FINDINGS: Postsurgical changes related to prior first TMT arthrodesis with fractures of several screws, fracture of the screw plate, and backing out of the second most distal screw. No significant osseous fusion across the first TMT joint. Healed fracture deformities of the second through fourth metatarsal necks. No acute fracture. There is focal lucency and cortical disruption along the proximal first metatarsal. The bones are diffusely osteopenic. Atherosclerotic vascular calcifications. IMPRESSION: 1. Postsurgical changes related to prior first TMT arthrodesis with multiple fractured screws, fracturing of the screw plate, and backing out of the second most distal screw. 2. Focal lucency and cortical  destruction along the proximal first metatarsal, concerning for osteomyelitis. Electronically Signed   By: Titus Dubin M.D.   On: 07/10/2017 17:17    EKG:   Orders placed or performed in visit on 07/30/13  . EKG 12-Lead    IMPRESSION AND PLAN:  #73.71 year old male patient with acute osteomyelitis of right first metatarsal. Continue IV antibiotics with vancomycin, Zosyn, check ESR, CRP, ID consult for duration of antibiotics. 4 diabetes consulted for possible wound debridement. Spoke with Dr. Vickki Muff. #2 history of seizure disorder: Continue Vimpat #3. chronic atrial fibrillation hold Xarelto because of possible surgery for wound debridement by podiatry. Continue sotalol. Start the patient on heparin while he is off the Xarelto. 5.Chronic pain syndrome, patient has severe right foot pain because of osteomyelitis. Continue OxyContin that he is already taking, add IV morphine,.  All the records are reviewed and case discussed with ED provider. Management plans discussed with the patient, family and they are in agreement.  CODE STATUS:full  TOTAL TIME TAKING CARE OF THIS PATIENT: 55 minutes.    Epifanio Lesches M.D on 07/10/2017 at 8:37 PM  Between 7am to 6pm - Pager - [phone]  After 6pm go to [email or web address] - password [email or web address] [facility]  Office  [phone]  CC: Primary care physician; Jodi Marble, MD  Note: This dictation was prepared with Dragon dictation along with smaller phrase technology. Any transcriptional errors that result from this process are unintentional.

## 2017-07-10 NOTE — Progress Notes (Addendum)
ANTICOAGULATION CONSULT NOTE - Initial Consult  Pharmacy Consult for Heparin  Indication: atrial fibrillation  No Known Allergies  Patient Measurements: Height: 5\' 10"  (177.8 cm) Weight: 155 lb 14.4 oz (70.7 kg) IBW/kg (Calculated) : 73 Heparin Dosing Weight: 70.7 kg   Vital Signs: Temp: 97.6 F (36.4 C) (09/12 2017) Temp Source: Oral (09/12 2017) BP: 112/51 (09/12 2017) Pulse Rate: 64 (09/12 2017)  Labs:  Recent Labs  07/10/17 1630  HGB 12.1*  HCT 34.9*  PLT 310  CREATININE 1.04    Estimated Creatinine Clearance: 66.1 mL/min (by C-G formula based on SCr of 1.04 mg/dL).   Medical History: History reviewed. No pertinent past medical history.  Medications:  Prescriptions Prior to Admission  Medication Sig Dispense Refill Last Dose  . busPIRone (BUSPAR) 7.5 MG tablet Take 7.5 mg by mouth 2 (two) times daily.   07/10/2017 at 0800  . celecoxib (CELEBREX) 200 MG capsule Take 200 mg by mouth daily.   07/09/2017 at Unknown time  . gabapentin (NEURONTIN) 300 MG capsule Take 300 mg by mouth 3 (three) times daily.   07/10/2017 at 0800  . OXYCONTIN 30 MG 12 hr tablet Take 1 tablet by mouth every 8 (eight) hours.  0 07/10/2017 at 0800  . sotalol (BETAPACE) 80 MG tablet Take 80 mg by mouth daily.   07/10/2017 at Unknown time  . SPIRIVA HANDIHALER 18 MCG inhalation capsule Place 1 capsule into inhaler and inhale daily.  1 07/10/2017 at Unknown time  . VIMPAT 50 MG TABS tablet Take 50 mg by mouth 2 (two) times daily.  2 07/10/2017 at 0800  . XARELTO 20 MG TABS tablet Take 20 mg by mouth daily.  2 07/09/2017 at Unknown time  . zolpidem (AMBIEN) 10 MG tablet Take 10 mg by mouth at bedtime.  2 07/09/2017 at 2000    Assessment: Pharmacy consulted to dose heparin in this 71 year old male with AFib.   Pt was on Xarelto at home, last dose was on 9/11.  Goal of Therapy:  Heparin level 0.3-0.7 units/ml Monitor platelets by anticoagulation protocol: Yes   Plan:  Will order baseline aPTT and  INR.  Give 4200 units bolus x 1 Start heparin infusion at 1050 units/hr Check anti-Xa level in 6 hours and daily while on heparin Continue to monitor H&H and platelets  Dillyn Joaquin D 07/10/2017,9:00 PM

## 2017-07-10 NOTE — ED Triage Notes (Signed)
PT to ED reporting hand foot and mouth infection on right foot for the past month. Pt reports he took antibiotics but his foot has continued to get worse. Pt had blisters form 2 weeks ago and now has a large black blister on top of right foot that is draining yellow puss. Pt had a screw placed in his bone "several years ago" and you can currently see the screw through pts wound. Pts reports he can not bear weight and can not move foot but sensation is intact. Foot is red and pale in color.

## 2017-07-10 NOTE — ED Provider Notes (Signed)
Marland Kitchen.[facility] Emergency Department Provider Note  ____________________________________________   I have reviewed the triage vital signs and the nursing notes.   HISTORY  Chief Complaint Wound Infection    HPI Stanley Nguyen is a 71 y.o. male Who presents today complaining of pain to his right foot. Patient states that years ago, about 10 he states, he had multiple fractures and had extensive foot repair done. He can't remember who did this. He thinks it was local however. He states since that time, his foot and "pretty good" and then he had hand-foot-and-mouth disease a few weeks ago, had a blister on the top of his foot which he popped and since that time he has had ongoing pain and infectious symptoms. Now he feels that there is a deep wound to the back of his foot. He has not had any fever or systemic illness but it hurts very much to walk on.     History reviewed. No pertinent past medical history.  Patient Active Problem List   Diagnosis Date Noted  . Acute osteomyelitis of metatarsal bone of right foot (HCC) 07/10/2017    History reviewed. No pertinent surgical history.  Prior to Admission medications   Medication Sig Start Date End Date Taking? Authorizing Provider  busPIRone (BUSPAR) 7.5 MG tablet Take 7.5 mg by mouth 2 (two) times daily. 07/09/17  Yes [provider]  celecoxib (CELEBREX) 200 MG capsule Take 200 mg by mouth daily. 07/02/17  Yes [provider]  gabapentin (NEURONTIN) 300 MG capsule Take 300 mg by mouth 3 (three) times daily. 07/09/17  Yes [provider]  OXYCONTIN 30 MG 12 hr tablet Take 1 tablet by mouth every 8 (eight) hours. 07/02/17  Yes [provider]  sotalol (BETAPACE) 80 MG tablet Take 80 mg by mouth daily. 07/02/17  Yes [provider]  SPIRIVA HANDIHALER 18 MCG inhalation capsule Place 1 capsule into inhaler and inhale daily. 07/03/17  Yes [provider]  VIMPAT 50 MG  TABS tablet Take 50 mg by mouth 2 (two) times daily. 07/02/17  Yes [provider]  XARELTO 20 MG TABS tablet Take 20 mg by mouth daily. 06/24/17  Yes [provider]  zolpidem (AMBIEN) 10 MG tablet Take 10 mg by mouth at bedtime. 06/25/17  Yes [provider]    Allergies Patient has no known allergies.  History reviewed. No pertinent family history.  Social History Social History  Substance Use Topics  . Smoking status: Current Every Day Smoker    Packs/day: 1.00    Types: Cigarettes  . Smokeless tobacco: Never Used  . Alcohol use No    Review of Systems Constitutional: No fever/chills Eyes: No visual changes. ENT: No sore throat. No stiff neck no neck pain Cardiovascular: Denies chest pain. Respiratory: Denies shortness of breath. Gastrointestinal:   no vomiting.  No diarrhea.  No constipation. Genitourinary: Negative for dysuria. Musculoskeletal: Negative lower extremity swelling Skin: Negative for rash. Neurological: Negative for severe headaches, focal weakness or numbness.   ____________________________________________   PHYSICAL EXAM:  VITAL SIGNS: ED Triage Vitals  Enc Vitals Group     BP 07/10/17 1457 105/76     Pulse Rate 07/10/17 1457 63     Resp 07/10/17 1457 16     Temp 07/10/17 1457 97.9 F (36.6 C)     Temp Source 07/10/17 1457 Oral     SpO2 07/10/17 1457 98 %     Weight 07/10/17 1455 170 lb (77.1 kg)  Height 07/10/17 1455  (1.778 m)     Head Circumference --      Peak Flow --      Pain Score 07/10/17 1455 10     Pain Loc --      Pain Edu? --      Excl. in GC? --     Constitutional: Alert and oriented. Well appearing and in no acute distress. Eyes: Conjunctivae are normal Head: Atraumatic HEENT: No congestion/rhinnorhea. Mucous membranes are moist.  Oropharynx non-erythematous Neck:   Nontender with no meningismus, no masses, no stridor Cardiovascular: Normal rate, regular rhythm. Grossly normal heart  sounds.  Good peripheral circulation. Respiratory: Normal respiratory effort.  No retractions. Lungs CTAB. Abdominal: Soft and nontender. No distention. No guarding no rebound Back:  There is no focal tenderness or step off.  there is no midline tenderness there are no lesions noted. there is no CVA tenderness Musculoskeletal: the right foot shows a very deep necrotic appearing and somewhat swollen lesion with purulent discharge. It is tender. It is possible palpate the hardware in his foot. I believe. There is a warm and well-perfused foot  pulses are not easy to palpate with fingers, they are present, DP and posterior tibial, with Doppler however. There are no streaks running up from the area. The wfoofoot itself is somewhat red and warm to touch around the actual lesion which is approximately 4 7 m x 2 cm.  Neurologic:  Normal speech and language. No gross focal neurologic deficits are appreciated.  Skin:  Skin is warm, dry and intact. No rash noted. Psychiatric: Mood and affect are normal. Speech and behavior are normal.  ____________________________________________   LABS (all labs ordered are listed, but only abnormal results are displayed)  Labs Reviewed  COMPREHENSIVE METABOLIC PANEL - Abnormal; Notable for the following:       Result Value   Sodium 131 (*)    Chloride 98 (*)    Glucose, Bld 109 (*)    Total Protein 8.5 (*)    Albumin 2.5 (*)    ALT 14 (*)    All other components within normal limits  CBC WITH DIFFERENTIAL/PLATELET - Abnormal; Notable for the following:    WBC 15.9 (*)    RBC 3.89 (*)    Hemoglobin 12.1 (*)    HCT 34.9 (*)    Neutro Abs 12.7 (*)    Monocytes Absolute 1.2 (*)    Basophils Absolute 0.2 (*)    All other components within normal limits  AEROBIC CULTURE (SUPERFICIAL SPECIMEN)  CULTURE, BLOOD (ROUTINE X 2)  CULTURE, BLOOD (ROUTINE X 2)  BASIC METABOLIC PANEL  CBC  SEDIMENTATION RATE  C-REACTIVE PROTEIN    ____________________________________________  EKG  I personally interpreted any EKGs ordered by me or triage  ____________________________________________  RADIOLOGY  I reviewed any imaging ordered by me or triage that were performed during my shift and, if possible, patient and/or family made aware of any abnormal findings. ____________________________________________   PROCEDURES  Procedure(s) performed: None  Procedures  Critical Care performed: None  ____________________________________________   INITIAL IMPRESSION / ASSESSMENT AND PLAN / ED COURSE  Pertinent labs & imaging results that were available during my care of the patient were reviewed by me and considered in my medical decision making (see chart for details).  discussed with Dr. Ether Griffins of podiatry, who agrees with management and will see patient as an inpatient. Evidence of osteomyelitis and likely infected hardware. Patient has a very poor prognosis in this wound.  Wound culture has been sent, pain medication administered, patient was admitted to the hospitalist service. IV antibiotics, broad-spectrum, have been administered.    ____________________________________________   FINAL CLINICAL IMPRESSION(S) / ED DIAGNOSES  Final diagnoses:  Acute osteomyelitis of right ankle or foot (HCC)      This chart was dictated using voice recognition software.  Despite best efforts to proofread,  errors can occur which can change meaning.      Jeanmarie Plant, MD 07/10/17 [phone]

## 2017-07-10 NOTE — Progress Notes (Signed)
Pharmacy Antibiotic Note  Isaac LaudBruce J Wamble is a 71 y.o. male admitted on 07/10/2017 with  sepsis secondary to wound infection and possible osteomyelitis.  Pharmacy has been consulted for vancomycin and Zosyn dosing. Patient received Vancomycin 1g IV and Zosyn 3.375 IV x 1 dose in ED.   Plan: Ke: 0.061   T1/2: 11.4   VD: 53.9  Will start patient on vancomycin 1g IV every 12 hours with 6 hour stack dosing. Calculated trough at Css is 17. Trough level ordered prior to 5th dose. Will monitor renal function and adjust dose as needed.   Start Zosyn 3.375 IV EI every 8 hours.   Height: 5\' 10"  (177.8 cm) Weight: 170 lb (77.1 kg) IBW/kg (Calculated) : 73  Temp (24hrs), Avg:97.9 F (36.6 C), Min:97.9 F (36.6 C), Max:97.9 F (36.6 C)   Recent Labs Lab 07/10/17 1630  WBC 15.9*  CREATININE 1.04    Estimated Creatinine Clearance: 68.2 mL/min (by C-G formula based on SCr of 1.04 mg/dL).    No Known Allergies  Antimicrobials this admission: 9/12 vancomycin  >>  9/12 Zosyn  >>   Dose adjustments this admission:  Microbiology results: 9/12 BCx: send  9/12 Wound Cx: pending   Thank you for allowing pharmacy to be a part of this patient's care.  Gardner CandleSheema M Savino Whisenant, PharmD, BCPS Clinical Pharmacist 07/10/2017 7:01 PM

## 2017-07-10 NOTE — ED Notes (Signed)
Pt wife Valeria BatmanBeatrice Alia can be reached at [phone].

## 2017-07-10 NOTE — Progress Notes (Signed)
Pt admitted for infection of right foot. Large wound on top of right foot with pus and screws exposed from previous surgery. Dr Ether GriffinsFowler visited pt tonight, will do surgery in am .

## 2017-07-10 NOTE — Consult Note (Signed)
ORTHOPAEDIC CONSULTATION  REQUESTING PHYSICIAN: Katha HammingKonidena, Snehalatha, MD  Chief Complaint: Infection right foot  HPI: Stanley Nguyen is a 71 y.o. male who complains of  recent wound and infection to his right foot. He states couple weeks ago he noticed a blister forming on the top of his foot. It is progressively become quite painful. He has a remote history of surgery to this right foot from a fracture. He cannot provide a specific date but several years ago per the patient. He states he was recently diagnosed with him for an mouth disease and he believes the wound started up from this.  History reviewed. No pertinent past medical history.  Review of previous admissions shows history of coronary artery disease, history of DVT, history of atrial fibrillation, diet-controlled diabetes, COPD. History of cirrhosis with alcohol abuse. History of neuropathy and chronic pain syndrome by the pain clinic  History reviewed. No pertinent surgical history. Does have a history of right foot surgery. Social History   Social History  . Marital status: Married    Spouse name: N/A  . Number of children: N/A  . Years of education: N/A   Social History Main Topics  . Smoking status: Current Every Day Smoker    Packs/day: 1.00    Types: Cigarettes  . Smokeless tobacco: Never Used  . Alcohol use No  . Drug use: No  . Sexual activity: Not Asked   Other Topics Concern  . None   Social History Narrative  . None   History reviewed. No pertinent family history. No Known Allergies Prior to Admission medications   Medication Sig Start Date End Date Taking? Authorizing Provider  busPIRone (BUSPAR) 7.5 MG tablet Take 7.5 mg by mouth 2 (two) times daily. 07/09/17  Yes [provider]  celecoxib (CELEBREX) 200 MG capsule Take 200 mg by mouth daily. 07/02/17  Yes [provider]  gabapentin (NEURONTIN) 300 MG capsule Take 300 mg by mouth 3 (three) times daily. 07/09/17  Yes [provider]  OXYCONTIN 30 MG 12 hr tablet Take 1 tablet by mouth every 8 (eight) hours. 07/02/17  Yes [provider]  sotalol (BETAPACE) 80 MG tablet Take 80 mg by mouth daily. 07/02/17  Yes [provider]  SPIRIVA HANDIHALER 18 MCG inhalation capsule Place 1 capsule into inhaler and inhale daily. 07/03/17  Yes [provider]  VIMPAT 50 MG TABS tablet Take 50 mg by mouth 2 (two) times daily. 07/02/17  Yes [provider]  XARELTO 20 MG TABS tablet Take 20 mg by mouth daily. 06/24/17  Yes [provider]  zolpidem (AMBIEN) 10 MG tablet Take 10 mg by mouth at bedtime. 06/25/17  Yes [provider]   Dg Foot Complete Right  Result Date: 07/10/2017 CLINICAL DATA:  Evaluate for osteomyelitis. EXAM: RIGHT FOOT COMPLETE - 3+ VIEW COMPARISON:  Mar 04, 2006. FINDINGS: Postsurgical changes related to prior first TMT arthrodesis with fractures of several screws, fracture of the screw plate, and backing out of the second most distal screw. No significant osseous fusion across the first TMT joint. Healed fracture deformities of the second through fourth metatarsal necks. No acute fracture. There is focal lucency and cortical disruption along the proximal first metatarsal. The bones are diffusely osteopenic. Atherosclerotic vascular calcifications. IMPRESSION: 1. Postsurgical changes related to prior first TMT arthrodesis with multiple fractured screws, fracturing of the screw plate, and backing out of the second most distal screw. 2. Focal lucency and cortical destruction along the proximal first  metatarsal, concerning for osteomyelitis. Electronically Signed   By: Obie Dredge M.D.   On: 07/10/2017 17:17    Positive ROS: All other systems have been reviewed and were otherwise negative with the exception of those mentioned in the HPI and as above.  12 point ROS was performed.  Physical Exam: General: Alert and oriented.  No apparent distress.  Vascular:  Left  foot:Dorsalis Pedis:  absent Posterior Tibial:  absent  Right foot: Dorsalis Pedis:  absent Posterior Tibial:  absent  Patient has dopplerable pulses per discussion with the ER physician.  Neuro: Gross sensation appears to be intact as he is quite uncomfortable with attempted debridement of the wound on the top of the right foot.  Derm: The dorsal aspect of his right foot overlying the base of the first metatarsal there is a large 3-4 cm ulceration with dark necrotic purulent tissue. Exposure of the long extensor tendon to the great toe is noted. An exposed screw is found as well. No lymphangitic streaking at this time.  Ortho/MS: Open exposed wound on the dorsal foot with exposed hardware to the right foot.   Assessment: Full-thickness ulcer dorsal right foot with infection. Exposed hardware. Concern for osteomyelitis on x-ray.  Plan: We'll plan for operative debridement tomorrow. We'll allow him to have breakfast in the morning and then nothing by mouth after 8 AM. Discussed the risks benefits alternatives and competitions associated with the surgery. Discussed his poor circulation and will need vascular consultation. Patient understands the high risk of continued ulceration to this foot and nonhealing to the wound.    Irean Hong, DPM Cell [phone]   07/10/2017 8:37 PM

## 2017-07-11 ENCOUNTER — Inpatient Hospital Stay: Payer: Medicare HMO

## 2017-07-11 ENCOUNTER — Encounter: Payer: Self-pay | Admitting: Internal Medicine

## 2017-07-11 LAB — CBC
HCT: 30 % — ABNORMAL LOW (ref 40.0–52.0)
HEMOGLOBIN: 10.4 g/dL — AB (ref 13.0–18.0)
MCH: 30.6 pg (ref 26.0–34.0)
MCHC: 34.7 g/dL (ref 32.0–36.0)
MCV: 88.2 fL (ref 80.0–100.0)
Platelets: 218 10*3/uL (ref 150–440)
RBC: 3.4 MIL/uL — AB (ref 4.40–5.90)
RDW: 14.5 % (ref 11.5–14.5)
WBC: 12 10*3/uL — ABNORMAL HIGH (ref 3.8–10.6)

## 2017-07-11 LAB — BASIC METABOLIC PANEL
ANION GAP: 8 (ref 5–15)
ANION GAP: 6 (ref 5–15)
BUN: 16 mg/dL (ref 6–20)
BUN: 15 mg/dL (ref 6–20)
CALCIUM: 8 mg/dL — AB (ref 8.9–10.3)
CHLORIDE: 98 mmol/L — AB (ref 101–111)
CO2: 23 mmol/L (ref 22–32)
CO2: 24 mmol/L (ref 22–32)
Calcium: 7.9 mg/dL — ABNORMAL LOW (ref 8.9–10.3)
Chloride: 98 mmol/L — ABNORMAL LOW (ref 101–111)
Creatinine, Ser: 1.3 mg/dL — ABNORMAL HIGH (ref 0.61–1.24)
Creatinine, Ser: 1.33 mg/dL — ABNORMAL HIGH (ref 0.61–1.24)
GFR calc Af Amer: >60 mL/min (ref >60)
GFR calc non Af Amer: 53 mL/min — ABNORMAL LOW (ref >60)
GFR, EST NON AFRICAN AMERICAN: 54 mL/min — AB (ref >60)
GLUCOSE: 93 mg/dL (ref 65–99)
Glucose, Bld: 92 mg/dL (ref 65–99)
Potassium: 3.9 mmol/L (ref 3.5–5.1)
Potassium: 3.6 mmol/L (ref 3.5–5.1)
Sodium: 129 mmol/L — ABNORMAL LOW (ref 135–145)
Sodium: 128 mmol/L — ABNORMAL LOW (ref 135–145)

## 2017-07-11 LAB — OSMOLALITY, URINE: Osmolality, Ur: 250 mOsm/kg — ABNORMAL LOW (ref 300–900)

## 2017-07-11 LAB — C-REACTIVE PROTEIN
CRP: 5.6 mg/dL — AB (ref <1.0)
CRP: 6.6 mg/dL — AB (ref <1.0)

## 2017-07-11 LAB — HEPARIN LEVEL (UNFRACTIONATED)
HEPARIN UNFRACTIONATED: 0.57 [IU]/mL (ref 0.30–0.70)
Heparin Unfractionated: 0.13 IU/mL — ABNORMAL LOW (ref 0.30–0.70)

## 2017-07-11 LAB — OSMOLALITY: OSMOLALITY: 278 mosm/kg (ref 275–295)

## 2017-07-11 LAB — SODIUM: SODIUM: 132 mmol/L — AB (ref 135–145)

## 2017-07-11 LAB — TSH: TSH: 2.397 u[IU]/mL (ref 0.350–4.500)

## 2017-07-11 MED ORDER — CEFAZOLIN SODIUM-DEXTROSE 2-4 GM/100ML-% IV SOLN
2.0000 g | INTRAVENOUS | Status: DC
  Filled 2017-07-11: qty 100

## 2017-07-11 MED ORDER — SODIUM CHLORIDE 3 % IV SOLN
INTRAVENOUS | Status: DC
  Administered 2017-07-11: 40 mL/h via INTRAVENOUS
  Filled 2017-07-11 (×2): qty 500

## 2017-07-11 MED ORDER — TOLVAPTAN 15 MG PO TABS
15.0000 mg | ORAL_TABLET | ORAL | Status: DC
  Administered 2017-07-11: 15 mg via ORAL
  Filled 2017-07-11: qty 1

## 2017-07-11 NOTE — Consult Note (Signed)
Date: 07/11/2017                  Patient Name:  Stanley Nguyen  MRN: [id number]  DOB: January 14, 1946  Age / Sex: 71 y.o., male         PCP: [name], MD                 Service Requesting Consult: IM hospitalist/ Auburn Bilberry, MD                 Reason for Consult: hyponatremia            History of Present Illness: Patient is a 71 y.o. male with medical problems of A Fib, Seizure ds, anxiety , who was admitted to [facility] on 07/10/2017 for evaluation of rt foot osteomyelitis.  He was supposed to have debridement of his foot surgery was delayed because of hyponatremia Nephrology consult requested for hyponatremia Patient denies any nausea or vomiting. He is able to void without any problems. No shortness of breath at present.   Medications: Outpatient medications: Prescriptions Prior to Admission  Medication Sig Dispense Refill Last Dose  . busPIRone (BUSPAR) 7.5 MG tablet Take 7.5 mg by mouth 2 (two) times daily.   07/10/2017 at 0800  . celecoxib (CELEBREX) 200 MG capsule Take 200 mg by mouth daily.   07/09/2017 at Unknown time  . gabapentin (NEURONTIN) 300 MG capsule Take 300 mg by mouth 3 (three) times daily.   07/10/2017 at 0800  . OXYCONTIN 30 MG 12 hr tablet Take 1 tablet by mouth every 8 (eight) hours.  0 07/10/2017 at 0800  . sotalol (BETAPACE) 80 MG tablet Take 80 mg by mouth daily.   07/10/2017 at Unknown time  . SPIRIVA HANDIHALER 18 MCG inhalation capsule Place 1 capsule into inhaler and inhale daily.  1 07/10/2017 at Unknown time  . VIMPAT 50 MG TABS tablet Take 50 mg by mouth 2 (two) times daily.  2 07/10/2017 at 0800  . XARELTO 20 MG TABS tablet Take 20 mg by mouth daily.  2 07/09/2017 at Unknown time  . zolpidem (AMBIEN) 10 MG tablet Take 10 mg by mouth at bedtime.  2 07/09/2017 at 2000    Current medications: Current Facility-Administered Medications  Medication Dose Route Frequency Provider Last Rate Last Dose  . acetaminophen (TYLENOL) tablet 650 mg  650 mg Oral  Q6H PRN Katha Hamming, MD       Or  . acetaminophen (TYLENOL) suppository 650 mg  650 mg Rectal Q6H PRN Katha Hamming, MD      . busPIRone (BUSPAR) tablet 7.5 mg  7.5 mg Oral BID Katha Hamming, MD   7.5 mg at 07/11/17 0915  . ceFAZolin (ANCEF) IVPB 2g/100 mL premix  2 g Intravenous On Call to OR Gwyneth Revels, DPM   Stopped at 07/11/17 1316  . gabapentin (NEURONTIN) capsule 300 mg  300 mg Oral TID Katha Hamming, MD   300 mg at 07/11/17 0915  . heparin ADULT infusion 100 units/mL (25000 units/257mL sodium chloride 0.45%)  1,050 Units/hr Intravenous Continuous Katha Hamming, MD   Stopped at 07/11/17 1340  . HYDROcodone-acetaminophen (NORCO/VICODIN) 5-325 MG per tablet 1-2 tablet  1-2 tablet Oral Q4H PRN Katha Hamming, MD   2 tablet at 07/11/17 0915  . lacosamide (VIMPAT) tablet 50 mg  50 mg Oral BID Katha Hamming, MD   50 mg at 07/11/17 0915  . morphine 2 MG/ML injection 2 mg  2 mg Intravenous Q4H PRN Luberta Mutter,  Snehalatha, MD   2 mg at 07/11/17 0428  . nicotine (NICODERM CQ - dosed in mg/24 hours) patch 21 mg  21 mg Transdermal Daily Gwyneth Revels, DPM   21 mg at 07/11/17 0915  . ondansetron (ZOFRAN) tablet 4 mg  4 mg Oral Q6H PRN Katha Hamming, MD       Or  . ondansetron (ZOFRAN) injection 4 mg  4 mg Intravenous Q6H PRN Katha Hamming, MD      . oxyCODONE (OXYCONTIN) 12 hr tablet 30 mg  30 mg Oral Q8H Katha Hamming, MD   30 mg at 07/11/17 1317  . piperacillin-tazobactam (ZOSYN) IVPB 3.375 g  3.375 g Intravenous Q8H Hallaji, Sheema M, RPH 12.5 mL/hr at 07/11/17 1341 3.375 g at 07/11/17 1341  . sodium chloride (hypertonic) 3 % solution   Intravenous Continuous Auburn Bilberry, MD 40 mL/hr at 07/11/17 1316 40 mL/hr at 07/11/17 1316  . sotalol (BETAPACE) tablet 80 mg  80 mg Oral Daily Katha Hamming, MD   80 mg at 07/11/17 0915  . tiotropium (SPIRIVA) inhalation capsule 18 mcg  1 capsule Inhalation Daily Katha Hamming,  MD   18 mcg at 07/11/17 1317  . vancomycin (VANCOCIN) IVPB 1000 mg/200 mL premix  1,000 mg Intravenous Q12H Hallaji, Mardene Speak, RPH   Stopped at 07/11/17 1417  . zolpidem (AMBIEN) tablet 5 mg  5 mg Oral QHS Katha Hamming, MD   5 mg at 07/10/17 2217      Allergies: No Known Allergies    Past Medical History: Past Medical History:  Diagnosis Date  . Atrial fibrillation (HCC)   . CAD (coronary artery disease)   . COPD (chronic obstructive pulmonary disease) (HCC)   . DM (diabetes mellitus) (HCC)    diet controlled  . Liver cirrhosis (HCC)   . Liver cirrhosis (HCC)   . Seizure disorder [facility])      Past Surgical History: History reviewed. No pertinent surgical history.   Family History: History reviewed. No pertinent family history.   Social History: Social History   Social History  . Marital status: Married    Spouse name: N/A  . Number of children: N/A  . Years of education: N/A   Occupational History  . Not on file.   Social History Main Topics  . Smoking status: Current Every Day Smoker    Packs/day: 1.00    Types: Cigarettes  . Smokeless tobacco: Never Used  . Alcohol use No  . Drug use: No  . Sexual activity: Not on file   Other Topics Concern  . Not on file   Social History Narrative  . No narrative on file     Review of Systems: Gen: No fevers or chills HEENT: Denies any hearing or vision issues of present CV: No shortness of breath. Does have atrial fibrillation Resp: No cough or sputum GI: No nausea vomiting or diarrhea GU : No blood in urine. No problems reported voiding MS: Right foot severe pain Derm:  No complaints Psych: No complaints Heme: No complaints Neuro: No complaints Endocrine. No complaints  Vital Signs: Blood pressure (!) 176/77, pulse (!) 58, temperature 98.4 F (36.9 C), temperature source Oral, resp. rate 18, height  (1.778 m), weight 72.3 kg (159 lb 4.8 oz), SpO2 100 %.   Intake/Output Summary (Last 24  hours) at 07/11/17 1636 Last data filed at 07/11/17 1500  Gross per 24 hour  Intake          2222.63 ml  Output  0 ml  Net          2222.63 ml    Weight trends: Filed Weights   07/10/17 1455 07/10/17 2017 07/11/17 0620  Weight: 77.1 kg (170 lb) 70.7 kg (155 lb 14.4 oz) 72.3 kg (159 lb 4.8 oz)    Physical Exam: General:  Elderly, lying in the bed, no acute distress   HEENT Anicteric, moist mucous membranes   Neck:  No JVD   Lungs: Normal breathing effort, clear to auscultation bilaterally   Heart::  Regular rhythm, no rub or gallop   Abdomen: Soft, nontender   Extremities:  Right foot in bandage, no edema   Neurologic: A & O  Skin: No acute rashes              Lab results: Basic Metabolic Panel:  Recent Labs Lab 07/10/17 1630 07/11/17 0524 07/11/17 1452  NA 131* 129* 128*  K 4.4 3.9 3.6  CL 98* 98* 98*  CO2 GLUCOSE 109* 92 93  BUN CREATININE 1.04 1.30* 1.33*  CALCIUM 9.1 8.0* 7.9*    Liver Function Tests:  Recent Labs Lab 07/10/17 1630  AST 24  ALT 14*  ALKPHOS 86  BILITOT 1.0  PROT 8.5*  ALBUMIN 2.5*   No results for input(s): LIPASE, AMYLASE in the last 168 hours. No results for input(s): AMMONIA in the last 168 hours.  CBC:  Recent Labs Lab 07/10/17 1630 07/11/17 0524  WBC 15.9* 12.0*  NEUTROABS 12.7*  --   HGB 12.1* 10.4*  HCT 34.9* 30.0*  MCV 89.7 88.2  PLT 310 218    Cardiac Enzymes: No results for input(s): CKTOTAL, TROPONINI in the last 168 hours.  BNP: Invalid input(s): POCBNP  CBG: No results for input(s): GLUCAP in the last 168 hours.  Microbiology: Recent Results (from the past 720 hour(s))  Wound or Superficial Culture     Status: None (Preliminary result)   Collection Time: 07/10/17  4:30 PM  Result Value Ref Range Status   Specimen Description FOOT  Final   Special Requests NONE  Final   Gram Stain RARE WBC SEEN FEW GRAM POSITIVE COCCI   Final   Culture   Final    MODERATE  STAPHYLOCOCCUS AUREUS SUSCEPTIBILITIES TO FOLLOW Performed at [facility], [address], [place], [place] [address]    Report Status PENDING  Incomplete  Culture, blood (routine x 2)     Status: None (Preliminary result)   Collection Time: 07/10/17  4:30 PM  Result Value Ref Range Status   Specimen Description BLOOD BLOOD LEFT FOREARM  Final   Special Requests   Final    BOTTLES DRAWN AEROBIC AND ANAEROBIC Blood Culture results may not be optimal due to an excessive volume of blood received in culture bottles   Culture NO GROWTH < 24 HOURS  Final   Report Status PENDING  Incomplete  Culture, blood (routine x 2)     Status: None (Preliminary result)   Collection Time: 07/10/17  4:30 PM  Result Value Ref Range Status   Specimen Description BLOOD LEFT ANTECUBITAL  Final   Special Requests   Final    BOTTLES DRAWN AEROBIC AND ANAEROBIC Blood Culture results may not be optimal due to an excessive volume of blood received in culture bottles   Culture NO GROWTH < 24 HOURS  Final   Report Status PENDING  Incomplete  Surgical pcr screen     Status: Abnormal  Collection Time: 07/10/17  9:05 PM  Result Value Ref Range Status   MRSA, PCR NEGATIVE NEGATIVE Final   Staphylococcus aureus POSITIVE (A) NEGATIVE Final    Comment: (NOTE) The Xpert SA Assay (FDA approved for NASAL specimens in patients 70 years of age and older), is one component of a comprehensive surveillance program. It is not intended to diagnose infection nor to guide or monitor treatment.      Coagulation Studies:  Recent Labs  07/10/17 2113  LABPROT 17.8*  INR 1.48    Urinalysis: No results for input(s): COLORURINE, LABSPEC, PHURINE, GLUCOSEU, HGBUR, BILIRUBINUR, KETONESUR, PROTEINUR, UROBILINOGEN, NITRITE, LEUKOCYTESUR in the last 72 hours.  Invalid input(s): APPERANCEUR      Imaging: Dg Foot Complete Right  Result Date: 07/10/2017 CLINICAL DATA:  Evaluate for osteomyelitis. EXAM: RIGHT FOOT  COMPLETE - 3+ VIEW COMPARISON:  Mar 04, 2006. FINDINGS: Postsurgical changes related to prior first TMT arthrodesis with fractures of several screws, fracture of the screw plate, and backing out of the second most distal screw. No significant osseous fusion across the first TMT joint. Healed fracture deformities of the second through fourth metatarsal necks. No acute fracture. There is focal lucency and cortical disruption along the proximal first metatarsal. The bones are diffusely osteopenic. Atherosclerotic vascular calcifications. IMPRESSION: 1. Postsurgical changes related to prior first TMT arthrodesis with multiple fractured screws, fracturing of the screw plate, and backing out of the second most distal screw. 2. Focal lucency and cortical destruction along the proximal first metatarsal, concerning for osteomyelitis. Electronically Signed   By: Obie DredgeWilliam T Derry M.D.   On: 07/10/2017 17:17      Assessment & Plan: Pt is a 71 y.o.  caucasian male with A Fib, Seizure ds, anxiety , who [facility]10[facility]2017 for evaluation of rt foot osteomyelitis.   His surgery was delayed to the vertebrobasilar hyponatremia 1. 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 ARF Patient's creatinine has increased from 1.04 to 1.3.3 He is nonoliguric We will continue to monitor his urine May need close [facility] level monitoring

## 2017-07-11 NOTE — Progress Notes (Addendum)
[employer] [facility] [place] [facility]                                                                                                                                                                                  Patient Demographics   Stanley CairoBruce Nguyen, is a 71 y.o. male, DOB - 1945-12-09, YQI:[id number]  Admit date - 07/10/2017   Admitting Physician Katha HammingSnehalatha Konidena, MD  Outpatient Primary MD for the patient is Sherron Mondayejan-Sie, S Ahmed, MD   LOS - 1  Subjective: Patient admitted with right foot osteomyelitis Patient however during surgery However because his sodium levels less than 1:30 anesthesia states that this needs to be improved prior to that Patient is asymptomatic with this low sodium    Review of Systems:   CONSTITUTIONAL: No documented fever. No fatigue, weakness. No weight gain, no weight loss.  EYES: No blurry or double vision.  ENT: No tinnitus. No postnasal drip. No redness of the oropharynx.  RESPIRATORY: No cough, no wheeze, no hemoptysis. No dyspnea.  CARDIOVASCULAR: No chest pain. No orthopnea. No palpitations. No syncope.  GASTROINTESTINAL: No nausea, no vomiting or diarrhea. No abdominal pain. No melena or hematochezia.  GENITOURINARY: No dysuria or hematuria.  ENDOCRINE: No polyuria or nocturia. No heat or cold intolerance.  HEMATOLOGY: No anemia. No bruising. No bleeding.  INTEGUMENTARY: No rashes. No lesions.  MUSCULOSKELETAL: right foot pain NEUROLOGIC: No numbness, tingling, or ataxia. No seizure-type activity.  PSYCHIATRIC: No anxiety. No insomnia. No ADD.    Vitals:   Vitals:   07/10/17 1911 07/10/17 2017 07/11/17 0620 07/11/17 0852  BP: 132/74 (!) 112/51  (!) 176/77  Pulse: 62 64  (!) 58  Resp: 16 18  18   Temp:  97.6 F (36.4 C)  98.4 F (36.9 C)  TempSrc:  Oral  Oral  SpO2: 100% 96%  100%  Weight:  155 lb 14.4 oz (70.7 kg) 159 lb 4.8 oz (72.3 kg)   Height:  5\' 10"  (1.778 m)      Wt Readings from Last 3 Encounters:  07/11/17 159  lb 4.8 oz (72.3 kg)     Intake/Output Summary (Last 24 hours) at 07/11/17 1256 Last data filed at 07/11/17 0900  Gross per 24 hour  Intake           1654.3 ml  Output                0 ml  Net           1654.3 ml    Physical Exam:   GENERAL: Pleasant-appearing in no apparent distress.  HEAD, EYES, EARS, NOSE AND THROAT: Atraumatic, normocephalic. Extraocular muscles are intact. Pupils equal and  reactive to light. Sclerae anicteric. No conjunctival injection. No oro-pharyngeal erythema.  NECK: Supple. There is no jugular venous distention. No bruits, no lymphadenopathy, no thyromegaly.  HEART: Regular rate and rhythm,. No murmurs, no rubs, no clicks.  LUNGS: Clear to auscultation bilaterally. No rales or rhonchi. No wheezes.  ABDOMEN: Soft, flat, nontender, nondistended. Has good bowel sounds. No hepatosplenomegaly appreciated.  EXTREMITIES:her right foot dressing in place NEUROLOGIC: The patient is alert, awake, and oriented x3 with no focal motor or sensory deficits appreciated bilaterally.  SKIN: Moist and warm with no rashes appreciated.  Psych: Not anxious, depressed LN: No inguinal LN enlargement    Antibiotics   Anti-infectives    Start     Dose/Rate Route Frequency Ordered Stop   07/11/17 0845  ceFAZolin (ANCEF) IVPB 2g/100 mL premix     2 g 200 mL/hr over 30 Minutes Intravenous On call to O.R. 07/11/17 0840 07/12/17 0559   07/11/17 0000  vancomycin (VANCOCIN) IVPB 1000 mg/200 mL premix     1,000 mg 200 mL/hr over 60 Minutes Intravenous Every 12 hours 07/10/17 1857     07/10/17 2200  piperacillin-tazobactam (ZOSYN) IVPB 3.375 g     3.375 g 12.5 mL/hr over 240 Minutes Intravenous Every 8 hours 07/10/17 1857     07/10/17 1645  vancomycin (VANCOCIN) IVPB 1000 mg/200 mL premix     1,000 mg 200 mL/hr over 60 Minutes Intravenous  Once 07/10/17 1630 07/10/17 1817   07/10/17 1645  piperacillin-tazobactam (ZOSYN) IVPB 3.375 g     3.375 g 100 mL/hr over 30 Minutes Intravenous   Once 07/10/17 1630 07/10/17 1733      Medications   Scheduled Meds: . busPIRone  7.5 mg Oral BID  . gabapentin  300 mg Oral TID  . lacosamide  50 mg Oral BID  . nicotine  21 mg Transdermal Daily  . oxyCODONE  30 mg Oral Q8H  . sotalol  80 mg Oral Daily  . tiotropium  1 capsule Inhalation Daily  . zolpidem  5 mg Oral QHS   Continuous Infusions: .  ceFAZolin (ANCEF) IV    . heparin 1,050 Units/hr (07/11/17 0432)  . piperacillin-tazobactam (ZOSYN)  IV Stopped (07/11/17 1017)  . sodium chloride (hypertonic)    . vancomycin Stopped (07/11/17 0103)   PRN Meds:.acetaminophen **OR** acetaminophen, HYDROcodone-acetaminophen, morphine injection, ondansetron **OR** ondansetron (ZOFRAN) IV   Data Review:   Micro Results Recent Results (from the past 240 hour(s))  Wound or Superficial Culture     Status: None (Preliminary result)   Collection Time: 07/10/17  4:30 PM  Result Value Ref Range Status   Specimen Description FOOT  Final   Special Requests NONE  Final   Gram Stain RARE WBC SEEN FEW GRAM POSITIVE COCCI   Final   Culture   Final    MODERATE STAPHYLOCOCCUS AUREUS SUSCEPTIBILITIES TO FOLLOW Performed at [facility], [address] [place], [place] [address]    Report Status PENDING  Incomplete  Culture, blood (routine x 2)     Status: None (Preliminary result)   Collection Time: 07/10/17  4:30 PM  Result Value Ref Range Status   Specimen Description BLOOD BLOOD LEFT FOREARM  Final   Special Requests   Final    BOTTLES DRAWN AEROBIC AND ANAEROBIC Blood Culture results may not be optimal due to an excessive volume of blood received in culture bottles   Culture NO GROWTH < 24 HOURS  Final   Report Status PENDING  Incomplete  Culture,  blood (routine x 2)     Status: None (Preliminary result)   Collection Time: 07/10/17  4:30 PM  Result Value Ref Range Status   Specimen Description BLOOD LEFT ANTECUBITAL  Final   Special Requests   Final    BOTTLES DRAWN AEROBIC  AND ANAEROBIC Blood Culture results may not be optimal due to an excessive volume of blood received in culture bottles   Culture NO GROWTH < 24 HOURS  Final   Report Status PENDING  Incomplete  Surgical pcr screen     Status: Abnormal   Collection Time: 07/10/17  9:05 PM  Result Value Ref Range Status   MRSA, PCR NEGATIVE NEGATIVE Final   Staphylococcus aureus POSITIVE (A) NEGATIVE Final    Comment: (NOTE) The Xpert SA Assay (FDA approved for NASAL specimens in patients 56 years of age and older), is one component of a comprehensive surveillance program. It is not intended to diagnose infection nor to guide or monitor treatment.     Radiology Reports Dg Foot Complete Right  Result Date: 07/10/2017 CLINICAL DATA:  Evaluate for osteomyelitis. EXAM: RIGHT FOOT COMPLETE - 3+ VIEW COMPARISON:  Mar 04, 2006. FINDINGS: Postsurgical changes related to prior first TMT arthrodesis with fractures of several screws, fracture of the screw plate, and backing out of the second most distal screw. No significant osseous fusion across the first TMT joint. Healed fracture deformities of the second through fourth metatarsal necks. No acute fracture. There is focal lucency and cortical disruption along the proximal first metatarsal. The bones are diffusely osteopenic. Atherosclerotic vascular calcifications. IMPRESSION: 1. Postsurgical changes related to prior first TMT arthrodesis with multiple fractured screws, fracturing of the screw plate, and backing out of the second most distal screw. 2. Focal lucency and cortical destruction along the proximal first metatarsal, concerning for osteomyelitis. Electronically Signed   By: Obie Dredge M.D.   On: 07/10/2017 17:17     CBC  Recent Labs Lab 07/10/17 1630 07/11/17 0524  WBC 15.9* 12.0*  HGB 12.1* 10.4*  HCT 34.9* 30.0*  PLT 310 218  MCV 89.7 88.2  MCH 31.1 30.6  MCHC 34.7 34.7  RDW 14.5 14.5  LYMPHSABS 1.6  --   MONOABS 1.2*  --   EOSABS 0.2  --    BASOSABS 0.2*  --     Chemistries   Recent Labs Lab 07/10/17 1630 07/11/17 0524  NA 131* 129*  K 4.4 3.9  CL 98* 98*  CO2 25 23  GLUCOSE 109* 92  BUN 12 16  CREATININE 1.04 1.30*  CALCIUM 9.1 8.0*  AST 24  --   ALT 14*  --   ALKPHOS 86  --   BILITOT 1.0  --    ------------------------------------------------------------------------------------------------------------------ estimated creatinine clearance is 54.1 mL/min (A) (by C-G formula based on SCr of 1.3 mg/dL (H)). ------------------------------------------------------------------------------------------------------------------ No results for input(s): HGBA1C in the last 72 hours. ------------------------------------------------------------------------------------------------------------------ No results for input(s): CHOL, HDL, LDLCALC, TRIG, CHOLHDL, LDLDIRECT in the last 72 hours. ------------------------------------------------------------------------------------------------------------------ No results for input(s): TSH, T4TOTAL, T3FREE, THYROIDAB in the last 72 hours.  Invalid input(s): FREET3 ------------------------------------------------------------------------------------------------------------------ No results for input(s): VITAMINB12, FOLATE, FERRITIN, TIBC, IRON, RETICCTPCT in the last 72 hours.  Coagulation profile  Recent Labs Lab 07/10/17 2113  INR 1.48    No results for input(s): DDIMER in the last 72 hours.  Cardiac Enzymes No results for input(s): CKMB, TROPONINI, MYOGLOBIN in the last 168 hours.  Invalid input(s): CK ------------------------------------------------------------------------------------------------------------------ Invalid input(s): POCBNP    Assessment & Plan  Pt.71 year old male patient with acute osteomyelitis of right first metatarsal.  1. Right foot first metatarsal osteomyelitis : Continue IV antibiotics with vancomycin, Zosyn Plan for surgery once   okayed  by anesthesiology  2. Hyponatremia possibly due to dehydration Patient not on any diuretic therapy I discussed with nephrology will put him on short-term 3% normal saline solution repeat sodium   3. diabetes  Diet control Place on sliding scale insulin:   4.  history of seizure disorder: Continue Vimpat  5. chronic atrial fibrillation continue sotalol and heparin  6. .Chronic pain syndrome, patient has severe right foot pain because of osteomyelitis. Continue OxyContin BuSpar, Neurontin  7. Misc: heparin drip   8. Nicotine abuse smoking cessation provided, 4 min spent, pt recommend to stop smoking he started on nicotine patch     Code Status Orders        Start     Ordered   07/10/17 1840  Full code  Continuous     07/10/17 1842    Code Status History    Date Active Date Inactive Code Status Order ID Comments User Context   This patient has a current code status but no historical code status.           Consults  podiatry  DVT Prophylaxis heparin  Lab Results  Component Value Date   PLT 218 07/11/2017     Time Spent in minutes   Greater than 50% of time spent in care coordination and counseling patient regarding the condition and plan of care.   Auburn Bilberry M.D on 07/11/2017 at 12:56 PM  Between 7am to 6pm - Pager - [phone]  After 6pm go to [email or web address] - password [email or web address] [facility]  [facility]   Office  [phone]

## 2017-07-11 NOTE — Progress Notes (Signed)
Surgery delayed secondary to decreased Na. Plan to OR tomorrow.

## 2017-07-11 NOTE — Consult Note (Signed)
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 male admitted with R foot wound and infection progressive for seeral weeks. He has hx DM and cirrhoris. Unclear what triggered the wound. It is quite painful and has foul odor.  Xray possible OM. ESR and CRP elevated. Poor ciriculation noted as well.   Past Medical History:  Diagnosis Date  . Atrial fibrillation ([place])   . CAD (coronary artery disease)   . COPD (chronic obstructive pulmonary disease) ([place])   . DM (diabetes mellitus) ([place])    diet controlled  . Liver cirrhosis ([place])   . Liver cirrhosis ([place])   . Seizure disorder [facility])    History reviewed. No pertinent surgical history. Social History  Substance Use Topics  . Smoking status: Current Every Day Smoker    Packs/day: 1.00    Types: Cigarettes  . Smokeless tobacco: Never Used  . Alcohol use No   History reviewed. No pertinent family history.  Allergies: No Known Allergies  Current antibiotics: Antibiotics Given (last 72 hours)    Date/Time Action Medication Dose Rate   07/10/17 1703 New Bag/Given   vancomycin (VANCOCIN) IVPB 1000 mg/200 mL premix 1,000 mg 200 mL/hr   07/10/17 1703 New Bag/Given   piperacillin-tazobactam (ZOSYN) IVPB 3.375 g 3.375 g 100 mL/hr   07/10/17 2218 New Bag/Given   piperacillin-tazobactam (ZOSYN) IVPB 3.375 g 3.375 g 12.5 mL/hr   07/11/17 0003 New Bag/Given   vancomycin (VANCOCIN) IVPB 1000 mg/200 mL premix 1,000 mg 200 mL/hr   07/11/17 0617 New Bag/Given   piperacillin-tazobactam (ZOSYN) IVPB 3.375 g 3.375 g 12.5 mL/hr      MEDICATIONS: . busPIRone  7.5 mg Oral BID  . gabapentin  300 mg Oral TID  . lacosamide  50 mg Oral BID  . nicotine  21 mg Transdermal Daily  . oxyCODONE  30 mg Oral Q8H  . sotalol  80 mg Oral Daily  .  tiotropium  1 capsule Inhalation Daily  . zolpidem  5 mg Oral QHS    Review of Systems - 11 systems reviewed and negative per HPI   OBJECTIVE: Temp:  [97.6 F (36.4 C)-98.4 F (36.9 C)] 98.4 F (36.9 C) (09/13 0852) Pulse Rate:  [58-65] 58 (09/13 0852) Resp:  [11-21] 18 (09/13 0852) BP: (105-176)/(51-122) 176/77 (09/13 0852) SpO2:  [96 %-100 %] 100 % (09/13 0852) Weight:  [70.7 kg (155 lb 14.4 oz)-77.1 kg (170 lb)] 72.3 kg (159 lb 4.8 oz) (09/13 1610) Physical Exam  Constitutional: He is oriented to person, place, and time.thin, frail  HENT: anicteric  Mouth/Throat: Oropharynx is clear and moist. No oropharyngeal exudate.  Cardiovascular: Normal rate, regular rhythm and normal heart sounds. Pulmonary/Chest: Effort normal and breath sounds normal. No respiratory distress. He has no wheezes.  Abdominal: Soft. Bowel sounds are normal. He exhibits no distension. There is no tenderness.  Lymphadenopathy: He has no cervical adenopathy.  Neurological: He is alert and oriented to person, place, and time.  Skin: Derm: The dorsal aspect of his right foot overlying the base of the first metatarsal there is a large 3-4 cm ulceration with dark necrotic purulent tissue. Exposure of the long extensor tendon to the great toe is noted. An exposed screw is found as well. No lymphangitic streaking at this time.  Psychiatric: He has a normal mood and affect. His behavior is normal.     LABS: Results for orders placed or performed during the hospital encounter of 07/10/17 (from the past 48 hour(s))  Comprehensive metabolic panel     Status: Abnormal   Collection Time: 07/10/17  4:30 PM  Result Value Ref Range   Sodium 131 (L) 135 - 145 mmol/L   Potassium 4.4 3.5 - 5.1 mmol/L   Chloride 98 (L) 101 - 111 mmol/L   CO2 25 22 - 32 mmol/L   Glucose, Bld 109 (H) 65 - 99 mg/dL   BUN 12 6 - 20 mg/dL   Creatinine, Ser 1.04 0.61 - 1.24 mg/dL   Calcium 9.1 8.9 - 10.3 mg/dL   Total Protein 8.5 (H) 6.5 - 8.1  g/dL   Albumin 2.5 (L) 3.5 - 5.0 g/dL   AST 24 15 - 41 U/L   ALT 14 (L) 17 - 63 U/L   Alkaline Phosphatase 86 38 - 126 U/L   Total Bilirubin 1.0 0.3 - 1.2 mg/dL   GFR calc non Af Amer >60 >60 mL/min   GFR calc Af Amer >60 >60 mL/min    Comment: (NOTE) The eGFR has been calculated using the CKD EPI equation. This calculation has not been validated in all clinical situations. eGFR's persistently <60 mL/min signify possible Chronic Kidney Disease.    Anion gap 8 5 - 15  CBC with Differential/Platelet     Status: Abnormal   Collection Time: 07/10/17  4:30 PM  Result Value Ref Range   WBC 15.9 (H) 3.8 - 10.6 K/uL   RBC 3.89 (L) 4.40 - 5.90 MIL/uL   Hemoglobin 12.1 (L) 13.0 - 18.0 g/dL   HCT 34.9 (L) 40.0 - 52.0 %   MCV 89.7 80.0 - 100.0 fL   MCH 31.1 26.0 - 34.0 pg   MCHC 34.7 32.0 - 36.0 g/dL   RDW 14.5 11.5 - 14.5 %   Platelets 310 150 - 440 K/uL   Neutrophils Relative % 80 %   Neutro Abs 12.7 (H) 1.4 - 6.5 K/uL   Lymphocytes Relative 10 %   Lymphs Abs 1.6 1.0 - 3.6 K/uL   Monocytes Relative 8 %   Monocytes Absolute 1.2 (H) 0.2 - 1.0 K/uL   Eosinophils Relative 1 %   Eosinophils Absolute 0.2 0 - 0.7 K/uL   Basophils Relative 1 %   Basophils Absolute 0.2 (H) 0 - 0.1 K/uL  Wound or Superficial Culture     Status: None (Preliminary result)   Collection Time: 07/10/17  4:30 PM  Result Value Ref Range   Specimen Description FOOT    Special Requests NONE    Gram Stain      RARE WBC SEEN FEW GRAM POSITIVE COCCI Performed at [facility], [address], [place], [place] [address]    Culture PENDING    Report Status PENDING   Culture, blood (routine x 2)     Status: None (Preliminary result)   Collection Time: 07/10/17  4:30 PM  Result Value Ref Range   Specimen Description BLOOD BLOOD LEFT FOREARM    Special Requests      BOTTLES DRAWN AEROBIC AND ANAEROBIC Blood Culture results may not be optimal due to an excessive volume of blood received in culture bottles    Culture NO GROWTH < 24 HOURS    Report Status PENDING   Culture, blood (routine x 2)     Status: None (Preliminary result)  Collection Time: 07/10/17  4:30 PM  Result Value Ref Range   Specimen Description BLOOD LEFT ANTECUBITAL    Special Requests      BOTTLES DRAWN AEROBIC AND ANAEROBIC Blood Culture results may not be optimal due to an excessive volume of blood received in culture bottles   Culture NO GROWTH < 24 HOURS    Report Status PENDING   Sedimentation rate     Status: Abnormal   Collection Time: 07/10/17  4:30 PM  Result Value Ref Range   Sed Rate 92 (H) 0 - 20 mm/hr  C-reactive protein     Status: Abnormal   Collection Time: 07/10/17  8:30 PM  Result Value Ref Range   CRP 5.6 (H) <1.0 mg/dL    Comment: Performed at [facility], [place] [address], [place], [place] [address]  Surgical pcr screen     Status: Abnormal   Collection Time: 07/10/17  9:05 PM  Result Value Ref Range   MRSA, PCR NEGATIVE NEGATIVE   Staphylococcus aureus POSITIVE (A) NEGATIVE    Comment: (NOTE) The Xpert SA Assay (FDA approved for NASAL specimens in patients 73 years of age and older), is one component of a comprehensive surveillance program. It is not intended to diagnose infection nor to guide or monitor treatment.   APTT     Status: Abnormal   Collection Time: 07/10/17  9:13 PM  Result Value Ref Range   aPTT 37 (H) 24 - 36 seconds    Comment:        IF BASELINE aPTT IS ELEVATED, SUGGEST PATIENT RISK ASSESSMENT BE USED TO DETERMINE APPROPRIATE ANTICOAGULANT THERAPY.   Protime-INR     Status: Abnormal   Collection Time: 07/10/17  9:13 PM  Result Value Ref Range   Prothrombin Time 17.8 (H) 11.4 - 15.2 seconds   INR 9.56   Basic metabolic panel     Status: Abnormal   Collection Time: 07/11/17  5:24 AM  Result Value Ref Range   Sodium 129 (L) 135 - 145 mmol/L   Potassium 3.9 3.5 - 5.1 mmol/L   Chloride 98 (L) 101 - 111 mmol/L   CO2 23 22 - 32 mmol/L   Glucose, Bld 92 65 - 99  mg/dL   BUN 16 6 - 20 mg/dL   Creatinine, Ser 1.30 (H) 0.61 - 1.24 mg/dL   Calcium 8.0 (L) 8.9 - 10.3 mg/dL   GFR calc non Af Amer 54 (L) >60 mL/min   GFR calc Af Amer >60 >60 mL/min    Comment: (NOTE) The eGFR has been calculated using the CKD EPI equation. This calculation has not been validated in all clinical situations. eGFR's persistently <60 mL/min signify possible Chronic Kidney Disease.    Anion gap 8 5 - 15  CBC     Status: Abnormal   Collection Time: 07/11/17  5:24 AM  Result Value Ref Range   WBC 12.0 (H) 3.8 - 10.6 K/uL   RBC 3.40 (L) 4.40 - 5.90 MIL/uL   Hemoglobin 10.4 (L) 13.0 - 18.0 g/dL   HCT 30.0 (L) 40.0 - 52.0 %   MCV 88.2 80.0 - 100.0 fL   MCH 30.6 26.0 - 34.0 pg   MCHC 34.7 32.0 - 36.0 g/dL   RDW 14.5 11.5 - 14.5 %   Platelets 218 150 - 440 K/uL  Heparin level (unfractionated)     Status: None   Collection Time: 07/11/17  7:29 AM  Result Value Ref Range   Heparin Unfractionated 0.57  0.30 - 0.70 IU/mL    Comment:        IF HEPARIN RESULTS ARE BELOW EXPECTED VALUES, AND PATIENT DOSAGE HAS BEEN CONFIRMED, SUGGEST FOLLOW UP TESTING OF ANTITHROMBIN III LEVELS.    No components found for: ESR, C REACTIVE PROTEIN MICRO: Recent Results (from the past 720 hour(s))  Wound or Superficial Culture     Status: None (Preliminary result)   Collection Time: 07/10/17  4:30 PM  Result Value Ref Range Status   Specimen Description FOOT  Final   Special Requests NONE  Final   Gram Stain   Final    RARE WBC SEEN FEW GRAM POSITIVE COCCI Performed at [facility], [address] [place], [place] [address]    Culture PENDING  Incomplete   Report Status PENDING  Incomplete  Culture, blood (routine x 2)     Status: None (Preliminary result)   Collection Time: 07/10/17  4:30 PM  Result Value Ref Range Status   Specimen Description BLOOD BLOOD LEFT FOREARM  Final   Special Requests   Final    BOTTLES DRAWN AEROBIC AND ANAEROBIC Blood Culture results may not  be optimal due to an excessive volume of blood received in culture bottles   Culture NO GROWTH < 24 HOURS  Final   Report Status PENDING  Incomplete  Culture, blood (routine x 2)     Status: None (Preliminary result)   Collection Time: 07/10/17  4:30 PM  Result Value Ref Range Status   Specimen Description BLOOD LEFT ANTECUBITAL  Final   Special Requests   Final    BOTTLES DRAWN AEROBIC AND ANAEROBIC Blood Culture results may not be optimal due to an excessive volume of blood received in culture bottles   Culture NO GROWTH < 24 HOURS  Final   Report Status PENDING  Incomplete  Surgical pcr screen     Status: Abnormal   Collection Time: 07/10/17  9:05 PM  Result Value Ref Range Status   MRSA, PCR NEGATIVE NEGATIVE Final   Staphylococcus aureus POSITIVE (A) NEGATIVE Final    Comment: (NOTE) The Xpert SA Assay (FDA approved for NASAL specimens in patients 22 years of age and older), is one component of a comprehensive surveillance program. It is not intended to diagnose infection nor to guide or monitor treatment.     IMAGING: Dg Foot Complete Right  Result Date: 07/10/2017 CLINICAL DATA:  Evaluate for osteomyelitis. EXAM: RIGHT FOOT COMPLETE - 3+ VIEW COMPARISON:  Mar 04, 2006. FINDINGS: Postsurgical changes related to prior first TMT arthrodesis with fractures of several screws, fracture of the screw plate, and backing out of the second most distal screw. No significant osseous fusion across the first TMT joint. Healed fracture deformities of the second through fourth metatarsal necks. No acute fracture. There is focal lucency and cortical disruption along the proximal first metatarsal. The bones are diffusely osteopenic. Atherosclerotic vascular calcifications. IMPRESSION: 1. Postsurgical changes related to prior first TMT arthrodesis with multiple fractured screws, fracturing of the screw plate, and backing out of the second most distal screw. 2. Focal lucency and cortical destruction  along the proximal first metatarsal, concerning for osteomyelitis. Electronically Signed   By: William T Derry M.D.   On: 07/10/2017 17:17    Assessment:   Johnston J Gouge is a 71 y.o. male with necrotic wound on dorsum of R foot with diminished circulation and exposed tendon and hardware. Xray suggests osteomyelitis.  He will have debridement and hardware removal by Dr Fowler   tomorrow. Cultures pending. Being seen by vascular   Recommendations Check esr, crp  Cont vanco and zosyn. WIll need PICC line and severael weeks IV abx  Thank you very much for allowing me to participate in the care of this patient. Please call with questions.    P. , MD  

## 2017-07-11 NOTE — Progress Notes (Signed)
ANTICOAGULATION CONSULT NOTE - Follow up   Pharmacy Consult for Heparin  Indication: atrial fibrillation  No Known Allergies  Patient Measurements: Height: 5\' 10"  (177.8 cm) Weight: 159 lb 4.8 oz (72.3 kg) IBW/kg (Calculated) : 73 Heparin Dosing Weight: 70.7 kg   Vital Signs: Temp: 98.4 F (36.9 C) (09/13 0852) Temp Source: Oral (09/13 0852) BP: 176/77 (09/13 0852) Pulse Rate: 58 (09/13 0852)  Labs:  Recent Labs  07/10/17 1630 07/10/17 2113 07/11/17 0524 07/11/17 0729 07/11/17 1452  HGB 12.1*  --  10.4*  --   --   HCT 34.9*  --  30.0*  --   --   PLT 310  --  218  --   --   APTT  --  37*  --   --   --   LABPROT  --  17.8*  --   --   --   INR  --  1.48  --   --   --   HEPARINUNFRC  --   --   --  0.57 0.13*  CREATININE 1.04  --  1.30*  --  1.33*    Estimated Creatinine Clearance: 52.9 mL/min (A) (by C-G formula based on SCr of 1.33 mg/dL (H)).   Medical History: Past Medical History:  Diagnosis Date  . Atrial fibrillation (HCC)   . CAD (coronary artery disease)   . COPD (chronic obstructive pulmonary disease) (HCC)   . DM (diabetes mellitus) (HCC)    diet controlled  . Liver cirrhosis (HCC)   . Liver cirrhosis (HCC)   . Seizure disorder (HCC)     Medications:  Prescriptions Prior to Admission  Medication Sig Dispense Refill Last Dose  . busPIRone (BUSPAR) 7.5 MG tablet Take 7.5 mg by mouth 2 (two) times daily.   07/10/2017 at 0800  . celecoxib (CELEBREX) 200 MG capsule Take 200 mg by mouth daily.   07/09/2017 at Unknown time  . gabapentin (NEURONTIN) 300 MG capsule Take 300 mg by mouth 3 (three) times daily.   07/10/2017 at 0800  . OXYCONTIN 30 MG 12 hr tablet Take 1 tablet by mouth every 8 (eight) hours.  0 07/10/2017 at 0800  . sotalol (BETAPACE) 80 MG tablet Take 80 mg by mouth daily.   07/10/2017 at Unknown time  . SPIRIVA HANDIHALER 18 MCG inhalation capsule Place 1 capsule into inhaler and inhale daily.  1 07/10/2017 at Unknown time  . VIMPAT 50 MG TABS  tablet Take 50 mg by mouth 2 (two) times daily.  2 07/10/2017 at 0800  . XARELTO 20 MG TABS tablet Take 20 mg by mouth daily.  2 07/09/2017 at Unknown time  . zolpidem (AMBIEN) 10 MG tablet Take 10 mg by mouth at bedtime.  2 07/09/2017 at 2000    Assessment: Pharmacy consulted to dose heparin in this 71 year old male with AFib.   Pt was on Xarelto at home, last dose was on 9/11.  Goal of Therapy:  Heparin level 0.3-0.7 units/ml Monitor platelets by anticoagulation protocol: Yes   Plan:  Will order baseline aPTT and INR.  Give 4200 units bolus x 1 Start heparin infusion at 1050 units/hr Check anti-Xa level in 6 hours and daily while on heparin Continue to monitor H&H and platelets  9/13 0729 HL 0.57 Recheck HL in 6 hours and follow up, continue current rate of 1050u/hr  09/13 1700 Pharmacy instructed to reorder heparin drip with stop time of 0400 on 9/14 for surgery. Drip was reordered w/o bolus with  appropriate stop time ordered. Nurse made aware of heparin plan.    Gardner Candle, PharmD, BCPS Clinical Pharmacist 07/11/2017 5:00 PM

## 2017-07-11 NOTE — Progress Notes (Signed)
ANTICOAGULATION CONSULT NOTE - Follow up   Pharmacy Consult for Heparin  Indication: atrial fibrillation  No Known Allergies  Patient Measurements: Height: 5\' 10"  (177.8 cm) Weight: 159 lb 4.8 oz (72.3 kg) IBW/kg (Calculated) : 73 Heparin Dosing Weight: 70.7 kg   Vital Signs:    Labs:  Recent Labs  07/10/17 1630 07/10/17 2113 07/11/17 0524 07/11/17 0729  HGB 12.1*  --  10.4*  --   HCT 34.9*  --  30.0*  --   PLT 310  --  218  --   APTT  --  37*  --   --   LABPROT  --  17.8*  --   --   INR  --  1.48  --   --   HEPARINUNFRC  --   --   --  0.57  CREATININE 1.04  --  1.30*  --     Estimated Creatinine Clearance: 54.1 mL/min (A) (by C-G formula based on SCr of 1.3 mg/dL (H)).   Medical History: Past Medical History:  Diagnosis Date  . Atrial fibrillation (HCC)   . CAD (coronary artery disease)   . COPD (chronic obstructive pulmonary disease) (HCC)   . DM (diabetes mellitus) (HCC)    diet controlled  . Liver cirrhosis (HCC)   . Liver cirrhosis (HCC)   . Seizure disorder (HCC)     Medications:  Prescriptions Prior to Admission  Medication Sig Dispense Refill Last Dose  . busPIRone (BUSPAR) 7.5 MG tablet Take 7.5 mg by mouth 2 (two) times daily.   07/10/2017 at 0800  . celecoxib (CELEBREX) 200 MG capsule Take 200 mg by mouth daily.   07/09/2017 at Unknown time  . gabapentin (NEURONTIN) 300 MG capsule Take 300 mg by mouth 3 (three) times daily.   07/10/2017 at 0800  . OXYCONTIN 30 MG 12 hr tablet Take 1 tablet by mouth every 8 (eight) hours.  0 07/10/2017 at 0800  . sotalol (BETAPACE) 80 MG tablet Take 80 mg by mouth daily.   07/10/2017 at Unknown time  . SPIRIVA HANDIHALER 18 MCG inhalation capsule Place 1 capsule into inhaler and inhale daily.  1 07/10/2017 at Unknown time  . VIMPAT 50 MG TABS tablet Take 50 mg by mouth 2 (two) times daily.  2 07/10/2017 at 0800  . XARELTO 20 MG TABS tablet Take 20 mg by mouth daily.  2 07/09/2017 at Unknown time  . zolpidem (AMBIEN) 10  MG tablet Take 10 mg by mouth at bedtime.  2 07/09/2017 at 2000    Assessment: Pharmacy consulted to dose heparin in this 71 year old male with AFib.   Pt was on Xarelto at home, last dose was on 9/11.  Goal of Therapy:  Heparin level 0.3-0.7 units/ml Monitor platelets by anticoagulation protocol: Yes   Plan:  Will order baseline aPTT and INR.  Give 4200 units bolus x 1 Start heparin infusion at 1050 units/hr Check anti-Xa level in 6 hours and daily while on heparin Continue to monitor H&H and platelets  9/13 0729 HL 0.57 Recheck HL in 6 hours and follow up, continue current rate of 1050u/hr   Gerre PebblesGarrett Esty Ahuja 07/11/2017,8:23 AM

## 2017-07-12 ENCOUNTER — Encounter
Admission: EM | Disposition: A | Discharge status: Skilled Nursing Facility | Payer: Self-pay | Source: Home / Self Care | Attending: Internal Medicine

## 2017-07-12 ENCOUNTER — Inpatient Hospital Stay: Payer: Medicare HMO | Admitting: Anesthesiology

## 2017-07-12 ENCOUNTER — Encounter: Payer: Self-pay | Admitting: Anesthesiology

## 2017-07-12 DIAGNOSIS — I2510 Atherosclerotic heart disease of native coronary artery without angina pectoris: Secondary | ICD-10-CM

## 2017-07-12 DIAGNOSIS — E11621 Type 2 diabetes mellitus with foot ulcer: Secondary | ICD-10-CM

## 2017-07-12 DIAGNOSIS — I4891 Unspecified atrial fibrillation: Secondary | ICD-10-CM

## 2017-07-12 DIAGNOSIS — L97519 Non-pressure chronic ulcer of other part of right foot with unspecified severity: Secondary | ICD-10-CM

## 2017-07-12 HISTORY — PX: IRRIGATION AND DEBRIDEMENT FOOT: SHX6602

## 2017-07-12 LAB — CBC
HCT: 32 % — ABNORMAL LOW (ref 40.0–52.0)
Hemoglobin: 10.9 g/dL — ABNORMAL LOW (ref 13.0–18.0)
MCH: 30.2 pg (ref 26.0–34.0)
MCHC: 34.1 g/dL (ref 32.0–36.0)
MCV: 88.5 fL (ref 80.0–100.0)
PLATELETS: 236 10*3/uL (ref 150–440)
RBC: 3.61 MIL/uL — ABNORMAL LOW (ref 4.40–5.90)
RDW: 14.5 % (ref 11.5–14.5)
WBC: 11.1 10*3/uL — AB (ref 3.8–10.6)

## 2017-07-12 LAB — HEPARIN LEVEL (UNFRACTIONATED): HEPARIN UNFRACTIONATED: 0.28 [IU]/mL — AB (ref 0.30–0.70)

## 2017-07-12 LAB — VANCOMYCIN, TROUGH: Vancomycin Tr: 34 ug/mL (ref 15–20)

## 2017-07-12 LAB — BASIC METABOLIC PANEL
Anion gap: 7 (ref 5–15)
BUN: 13 mg/dL (ref 6–20)
CHLORIDE: 102 mmol/L (ref 101–111)
CO2: 26 mmol/L (ref 22–32)
CREATININE: 1.3 mg/dL — AB (ref 0.61–1.24)
Calcium: 8.3 mg/dL — ABNORMAL LOW (ref 8.9–10.3)
GFR calc non Af Amer: 54 mL/min — ABNORMAL LOW (ref >60)
Glucose, Bld: 94 mg/dL (ref 65–99)
POTASSIUM: 4.2 mmol/L (ref 3.5–5.1)
Sodium: 135 mmol/L (ref 135–145)

## 2017-07-12 LAB — GLUCOSE, CAPILLARY
GLUCOSE-CAPILLARY: 86 mg/dL (ref 65–99)
Glucose-Capillary: 72 mg/dL (ref 65–99)

## 2017-07-12 LAB — SODIUM: Sodium: 136 mmol/L (ref 135–145)

## 2017-07-12 LAB — CORTISOL: Cortisol, Plasma: 8.8 ug/dL

## 2017-07-12 LAB — SEDIMENTATION RATE: Sed Rate: >140 mm/hr — ABNORMAL HIGH (ref 0–20)

## 2017-07-12 SURGERY — IRRIGATION AND DEBRIDEMENT FOOT
Anesthesia: General | Laterality: Right

## 2017-07-12 MED ORDER — LIDOCAINE HCL (CARDIAC) 20 MG/ML IV SOLN
INTRAVENOUS | Status: DC | PRN
  Administered 2017-07-12: 40 mg via INTRAVENOUS

## 2017-07-12 MED ORDER — FAMOTIDINE 20 MG PO TABS
20.0000 mg | ORAL_TABLET | Freq: Once | ORAL | Status: AC
  Administered 2017-07-12: 20 mg via ORAL

## 2017-07-12 MED ORDER — PROPOFOL 10 MG/ML IV BOLUS
INTRAVENOUS | Status: DC | PRN
  Administered 2017-07-12: 150 mg via INTRAVENOUS

## 2017-07-12 MED ORDER — FENTANYL CITRATE (PF) 100 MCG/2ML IJ SOLN
INTRAMUSCULAR | Status: AC
  Administered 2017-07-12: 25 ug via INTRAVENOUS
  Filled 2017-07-12: qty 2

## 2017-07-12 MED ORDER — POLYETHYLENE GLYCOL 3350 17 G PO PACK
17.0000 g | PACK | Freq: Every day | ORAL | Status: DC
  Administered 2017-07-14 – 2017-07-19 (×5): 17 g via ORAL
  Filled 2017-07-12 (×7): qty 1

## 2017-07-12 MED ORDER — GLYCOPYRROLATE 0.2 MG/ML IJ SOLN
INTRAMUSCULAR | Status: DC | PRN
  Administered 2017-07-12 (×2): 0.1 mg via INTRAVENOUS

## 2017-07-12 MED ORDER — FENTANYL CITRATE (PF) 100 MCG/2ML IJ SOLN
INTRAMUSCULAR | Status: DC | PRN
  Administered 2017-07-12 (×2): 25 ug via INTRAVENOUS
  Administered 2017-07-12: 50 ug via INTRAVENOUS

## 2017-07-12 MED ORDER — HYDRALAZINE HCL 20 MG/ML IJ SOLN
10.0000 mg | Freq: Four times a day (QID) | INTRAMUSCULAR | Status: DC | PRN
  Administered 2017-07-12 – 2017-07-14 (×2): 10 mg via INTRAVENOUS
  Filled 2017-07-12 (×3): qty 1

## 2017-07-12 MED ORDER — PHENYLEPHRINE HCL 10 MG/ML IJ SOLN
INTRAMUSCULAR | Status: DC | PRN
  Administered 2017-07-12 (×3): 50 ug via INTRAVENOUS
  Administered 2017-07-12: 150 ug via INTRAVENOUS
  Administered 2017-07-12: 100 ug via INTRAVENOUS
  Administered 2017-07-12: 50 ug via INTRAVENOUS

## 2017-07-12 MED ORDER — SODIUM CHLORIDE 0.9% FLUSH
10.0000 mL | Freq: Two times a day (BID) | INTRAVENOUS | Status: DC
  Administered 2017-07-12 – 2017-07-19 (×11): 10 mL

## 2017-07-12 MED ORDER — HEPARIN BOLUS VIA INFUSION
1000.0000 [IU] | Freq: Once | INTRAVENOUS | Status: AC
  Administered 2017-07-12: 1000 [IU] via INTRAVENOUS
  Filled 2017-07-12: qty 1000

## 2017-07-12 MED ORDER — HEPARIN (PORCINE) IN NACL 100-0.45 UNIT/ML-% IJ SOLN
1200.0000 [IU]/h | INTRAMUSCULAR | Status: DC
  Administered 2017-07-12: 1050 [IU]/h via INTRAVENOUS
  Administered 2017-07-13 – 2017-07-17 (×4): 1200 [IU]/h via INTRAVENOUS
  Filled 2017-07-12 (×5): qty 250

## 2017-07-12 MED ORDER — LIDOCAINE-EPINEPHRINE 1 %-1:100000 IJ SOLN
INTRAMUSCULAR | Status: DC | PRN
Start: 2017-07-12 — End: 2017-07-12
  Administered 2017-07-12: 5 mL

## 2017-07-12 MED ORDER — MIDAZOLAM HCL 2 MG/2ML IJ SOLN
INTRAMUSCULAR | Status: DC | PRN
  Administered 2017-07-12: 2 mg via INTRAVENOUS

## 2017-07-12 MED ORDER — HEPARIN (PORCINE) IN NACL 100-0.45 UNIT/ML-% IJ SOLN
1050.0000 [IU]/h | INTRAMUSCULAR | Status: DC
  Administered 2017-07-12: 1050 [IU]/h via INTRAVENOUS
  Filled 2017-07-12: qty 250

## 2017-07-12 MED ORDER — ONDANSETRON HCL 4 MG/2ML IJ SOLN: 4.0000 mg | Freq: Once | INTRAMUSCULAR | Status: DC | PRN

## 2017-07-12 MED ORDER — OXYCODONE-ACETAMINOPHEN 5-325 MG PO TABS
1.0000 | ORAL_TABLET | ORAL | Status: DC | PRN
  Administered 2017-07-12 (×2): 2 via ORAL
  Administered 2017-07-13: 1 via ORAL
  Administered 2017-07-13: 2 via ORAL
  Administered 2017-07-13: 1 via ORAL
  Administered 2017-07-13 – 2017-07-19 (×20): 2 via ORAL
  Filled 2017-07-12 (×4): qty 2
  Filled 2017-07-12: qty 1
  Filled 2017-07-12 (×18): qty 2
  Filled 2017-07-12: qty 1
  Filled 2017-07-12: qty 2

## 2017-07-12 MED ORDER — LACTATED RINGERS IV SOLN
INTRAVENOUS | Status: DC | PRN
  Administered 2017-07-12: 09:00:00 via INTRAVENOUS

## 2017-07-12 MED ORDER — BUPIVACAINE HCL 0.5 % IJ SOLN
INTRAMUSCULAR | Status: DC | PRN
  Administered 2017-07-12: 5 mL

## 2017-07-12 MED ORDER — MUPIROCIN 2 % EX OINT
1.0000 "application " | TOPICAL_OINTMENT | Freq: Two times a day (BID) | CUTANEOUS | Status: AC
  Administered 2017-07-12 – 2017-07-16 (×9): 1 via NASAL
  Filled 2017-07-12: qty 22

## 2017-07-12 MED ORDER — CHLORHEXIDINE GLUCONATE CLOTH 2 % EX PADS
6.0000 | MEDICATED_PAD | Freq: Every day | CUTANEOUS | Status: AC
  Administered 2017-07-13 – 2017-07-16 (×3): 6 via TOPICAL

## 2017-07-12 MED ORDER — SODIUM CHLORIDE 0.9% FLUSH
10.0000 mL | INTRAVENOUS | Status: DC | PRN
  Administered 2017-07-18: 10 mL
  Filled 2017-07-12: qty 40

## 2017-07-12 MED ORDER — SODIUM CHLORIDE 0.9 % IV SOLN
3.0000 g | Freq: Four times a day (QID) | INTRAVENOUS | Status: DC
  Administered 2017-07-12 – 2017-07-19 (×27): 3 g via INTRAVENOUS
  Filled 2017-07-12 (×36): qty 3

## 2017-07-12 MED ORDER — FENTANYL CITRATE (PF) 100 MCG/2ML IJ SOLN
25.0000 ug | INTRAMUSCULAR | Status: AC | PRN
  Administered 2017-07-12 (×6): 25 ug via INTRAVENOUS

## 2017-07-12 MED ORDER — ONDANSETRON HCL 4 MG/2ML IJ SOLN
INTRAMUSCULAR | Status: DC | PRN
  Administered 2017-07-12: 4 mg via INTRAVENOUS

## 2017-07-12 SURGICAL SUPPLY — 60 items
BANDAGE ACE 4X5 VEL STRL LF (GAUZE/BANDAGES/DRESSINGS) ×2 IMPLANT
BANDAGE STRETCH 3X4.1 STRL (GAUZE/BANDAGES/DRESSINGS) IMPLANT
BLADE OSC/SAGITTAL MD 5.5X18 (BLADE) IMPLANT
BLADE OSCILLATING/SAGITTAL (BLADE)
BLADE SW THK.38XMED LNG THN (BLADE) IMPLANT
BNDG COHESIVE 4X5 TAN STRL (GAUZE/BANDAGES/DRESSINGS) ×2 IMPLANT
BNDG COHESIVE 6X5 TAN STRL LF (GAUZE/BANDAGES/DRESSINGS) IMPLANT
BNDG ESMARK 4X12 TAN STRL LF (GAUZE/BANDAGES/DRESSINGS) ×2 IMPLANT
BNDG GAUZE 4.5X4.1 6PLY STRL (MISCELLANEOUS) ×2 IMPLANT
CANISTER SUCT 1200ML W/VALVE (MISCELLANEOUS) ×2 IMPLANT
CANISTER SUCT 3000ML PPV (MISCELLANEOUS) ×2 IMPLANT
CUFF TOURN 18 STER (MISCELLANEOUS) IMPLANT
CUFF TOURN DUAL PL 12 NO SLV (MISCELLANEOUS) ×2 IMPLANT
DRAPE FLUOR MINI C-ARM 54X84 (DRAPES) IMPLANT
DRAPE XRAY CASSETTE 23X24 (DRAPES) IMPLANT
DRESSING ALLEVYN 4X4 (MISCELLANEOUS) IMPLANT
DURAPREP 26ML APPLICATOR (WOUND CARE) ×2 IMPLANT
ELECT REM PT RETURN 9FT ADLT (ELECTROSURGICAL) ×2
ELECTRODE REM PT RTRN 9FT ADLT (ELECTROSURGICAL) ×1 IMPLANT
GAUZE PACKING 1/4 X5 YD (GAUZE/BANDAGES/DRESSINGS) ×2 IMPLANT
GAUZE PACKING IODOFORM 1X5 (MISCELLANEOUS) ×2 IMPLANT
GAUZE PETRO XEROFOAM 1X8 (MISCELLANEOUS) IMPLANT
GAUZE SPONGE 4X4 12PLY STRL (GAUZE/BANDAGES/DRESSINGS) IMPLANT
GAUZE STRETCH 2X75IN STRL (MISCELLANEOUS) IMPLANT
GLOVE BIO SURGEON STRL SZ7.5 (GLOVE) ×2 IMPLANT
GLOVE INDICATOR 8.0 STRL GRN (GLOVE) ×2 IMPLANT
GOWN STRL REUS W/ TWL LRG LVL3 (GOWN DISPOSABLE) ×2 IMPLANT
GOWN STRL REUS W/TWL LRG LVL3 (GOWN DISPOSABLE) ×2
GOWN STRL REUS W/TWL MED LVL3 (GOWN DISPOSABLE) ×4 IMPLANT
HANDPIECE VERSAJET DEBRIDEMENT (MISCELLANEOUS) IMPLANT
IV NS 1000ML (IV SOLUTION) ×1
IV NS 1000ML BAXH (IV SOLUTION) ×1 IMPLANT
KIT DRSG VAC SLVR GRANUFM (MISCELLANEOUS) ×2 IMPLANT
KIT RM TURNOVER STRD PROC AR (KITS) ×2 IMPLANT
LABEL OR SOLS (LABEL) ×2 IMPLANT
NEEDLE FILTER BLUNT 18X 1/2SAF (NEEDLE) ×1
NEEDLE FILTER BLUNT 18X1 1/2 (NEEDLE) ×1 IMPLANT
NEEDLE HYPO 25X1 1.5 SAFETY (NEEDLE) ×2 IMPLANT
NS IRRIG 500ML POUR BTL (IV SOLUTION) ×2 IMPLANT
PACK EXTREMITY ARMC (MISCELLANEOUS) ×2 IMPLANT
PAD ABD DERMACEA PRESS 5X9 (GAUZE/BANDAGES/DRESSINGS) IMPLANT
PULSAVAC PLUS IRRIG FAN TIP (DISPOSABLE)
RASP SM TEAR CROSS CUT (RASP) IMPLANT
SOL .9 NS 3000ML IRR  AL (IV SOLUTION) ×1
SOL .9 NS 3000ML IRR UROMATIC (IV SOLUTION) ×1 IMPLANT
SOL PREP PVP 2OZ (MISCELLANEOUS) ×2
SOLUTION PREP PVP 2OZ (MISCELLANEOUS) ×1 IMPLANT
STOCKINETTE IMPERVIOUS 9X36 MD (GAUZE/BANDAGES/DRESSINGS) ×2 IMPLANT
SUT ETHILON 2 0 FS 18 (SUTURE) IMPLANT
SUT ETHILON 4-0 (SUTURE) ×1
SUT ETHILON 4-0 FS2 18XMFL BLK (SUTURE) ×1
SUT VIC AB 3-0 SH 27 (SUTURE) ×1
SUT VIC AB 3-0 SH 27X BRD (SUTURE) ×1 IMPLANT
SUT VIC AB 4-0 FS2 27 (SUTURE) ×2 IMPLANT
SUTURE ETHLN 4-0 FS2 18XMF BLK (SUTURE) ×1 IMPLANT
SWAB CULTURE AMIES ANAERIB BLU (MISCELLANEOUS) IMPLANT
SYR 3ML LL SCALE MARK (SYRINGE) ×2 IMPLANT
SYRINGE 10CC LL (SYRINGE) ×4 IMPLANT
TIP FAN IRRIG PULSAVAC PLUS (DISPOSABLE) IMPLANT
WND VAC CANISTER 500ML (MISCELLANEOUS) ×2 IMPLANT

## 2017-07-12 NOTE — Progress Notes (Signed)
Pharmacy Antibiotic Note  Stanley Nguyen is a 71 y.o. male admitted on 07/10/2017 with  sepsis secondary to wound infection and possible osteomyelitis.  Pharmacy has been consulted for vancomycin and Zosyn dosing. Patient received Vancomycin 1g IV and Zosyn 3.375 IV x 1 dose in ED.  Vancomycin trough came back supratherapeutic (34 on vancomycin 1 g IV q12h) on 9/14 but antibiotics narrowed to Unasyn.  Plan: Will order Unasyn 3 g IV q6h  Height:  (177.8 cm) Weight: 159 lb 14.4 oz (72.5 kg) IBW/kg (Calculated) : 73  Temp (24hrs), Avg:97.5 F (36.4 C), Min:96.5 F (35.8 C), Max:97.9 F (36.6 C)   Recent Labs Lab 07/10/17 1630 07/11/17 0524 07/11/17 1452 07/12/17 0422 07/12/17 0830 07/12/17 1234  WBC 15.9* 12.0*  --   --  11.1*  --   CREATININE 1.04 1.30* 1.33* 1.30*  --   --   VANCOTROUGH  --   --   --   --   --  34*    Estimated Creatinine Clearance: 54.2 mL/min (A) (by C-G formula based on SCr of 1.3 mg/dL (H)).    Allergies  Allergen Reactions  . Keppra [Levetiracetam] Rash    Antimicrobials this admission: 9/12 vancomycin  >> 9/14 9/12 Zosyn  >> 9/14 Unasyn 9/14>>  Dose adjustments this admission:  Microbiology results: 9/12 BCx: NGTD 9/12 Wound Cx: MSSA  Thank you for allowing pharmacy to be a part of this patient's care.  Marty Heck, PharmD, BCPS Clinical Pharmacist 07/12/2017 1:28 PM

## 2017-07-12 NOTE — Progress Notes (Signed)
Peripherally Inserted Central Catheter/Midline Placement  The IV Nurse has discussed with the patient and/or persons authorized to consent for the patient, the purpose of this procedure and the potential benefits and risks involved with this procedure.  The benefits include less needle sticks, lab draws from the catheter, and the patient may be discharged home with the catheter. Risks include, but not limited to, infection, bleeding, blood clot (thrombus formation), and puncture of an artery; nerve damage and irregular heartbeat and possibility to perform a PICC exchange if needed/ordered by physician.  Alternatives to this procedure were also discussed.  Bard Power PICC patient education guide, fact sheet on infection prevention and patient information card has been provided to patient /or left at bedside.    PICC/Midline Placement Documentation  PICC Single Lumen 07/12/17 PICC Right Basilic 41 cm 0 cm (Active)  Indication for Insertion or Continuance of Line Home intravenous therapies (PICC only) 07/12/2017  5:00 PM  Exposed Catheter (cm) 0 cm 07/12/2017  5:00 PM  Site Assessment Clean;Dry;Intact 07/12/2017  5:00 PM  Line Status Flushed;Saline locked;Blood return noted 07/12/2017  5:00 PM  Dressing Type Transparent;Securing device 07/12/2017  5:00 PM  Dressing Status Clean;Dry;Intact;Antimicrobial disc in place 07/12/2017  5:00 PM  Dressing Change Due 07/19/17 07/12/2017  5:00 PM       [name] 07/12/2017, 5:17 PM

## 2017-07-12 NOTE — OR Nursing (Signed)
Eight screws were explanted from patient's right foot.

## 2017-07-12 NOTE — H&P (Signed)
HISTORY AND PHYSICAL INTERVAL NOTE:  07/12/2017  9:28 AM  Stanley Nguyen  has presented today for surgery, with the diagnosis of n/a.  The various methods of treatment have been discussed with the patient.  No guarantees were given.  After consideration of risks, benefits and other options for treatment, the patient has consented to surgery.  I have reviewed the patients' chart and labs.    Patient Vitals for the past 24 hrs:  BP Temp Temp src Pulse Resp SpO2 Weight  07/12/17 0807 (!) 172/66 - - 61 - 99 % -  07/12/17 0746 (!) 178/83 97.8 F (36.6 C) Oral 60 - 99 % -  07/12/17 0500 - - - - - - 72.5 kg (159 lb 14.4 oz)  07/11/17 2027 (!) 151/70 97.9 F (36.6 C) Oral (!) 53 18 100 % -      The patient was reexamined.  There have been no changes to this history and physical examination.Pt with low Na yesterday and now WNL.  OK to proceed with surgery.  Gwyneth Revels A

## 2017-07-12 NOTE — Anesthesia Post-op Follow-up Note (Signed)
Anesthesia QCDR form completed.        

## 2017-07-12 NOTE — Progress Notes (Signed)
PRN IV Hydralazine given.

## 2017-07-12 NOTE — Anesthesia Postprocedure Evaluation (Signed)
Anesthesia Post Note  Patient: Stanley Nguyen  Procedure(s) Performed: Procedure(s) (LRB): IRRIGATION AND DEBRIDEMENT FOOT (Right)  Patient location during evaluation: PACU Anesthesia Type: General Level of consciousness: awake and alert and oriented Pain management: pain level controlled Vital Signs Assessment: post-procedure vital signs reviewed and stable Respiratory status: spontaneous breathing Cardiovascular status: blood pressure returned to baseline Anesthetic complications: no     Last Vitals:  Vitals:   07/12/17 1125 07/12/17 1211  BP: 120/60 (!) 124/58  Pulse: 66 66  Resp: 18 18  Temp:    SpO2:  98%    Last Pain:  Vitals:   07/12/17 1323  TempSrc:   PainSc: 8                  Roye Gustafson

## 2017-07-12 NOTE — Progress Notes (Signed)
[employer] [facility] [place]                                                                                                                                                                                  Patient Demographics   Stanley Nguyen, is a 71 y.o. male, DOB - [birth year], NWG:[id number]  Admit date - 07/10/2017   Admitting Physician Katha Hamming, MD  Outpatient Primary MD for the patient is [name], MD   LOS - 2  Subjective: He waiting for surgery, Na normal Impression chronic complaints at present before but otherwise has no other symtoms  Review of Systems:   CONSTITUTIONAL: No documented fever. No fatigue, weakness. No weight gain, no weight loss.  EYES: No blurry or double vision.  ENT: No tinnitus. No postnasal drip. No redness of the oropharynx.  RESPIRATORY: No cough, no wheeze, no hemoptysis. No dyspnea.  CARDIOVASCULAR: No chest pain. No orthopnea. No palpitations. No syncope.  GASTROINTESTINAL: No nausea, no vomiting or diarrhea. No abdominal pain. No melena or hematochezia.  GENITOURINARY: No dysuria or hematuria.  ENDOCRINE: No polyuria or nocturia. No heat or cold intolerance.  HEMATOLOGY: No anemia. No bruising. No bleeding.  INTEGUMENTARY: No rashes. No lesions.  MUSCULOSKELETAL: right foot pain NEUROLOGIC: No numbness, tingling, or ataxia. No seizure-type activity.  PSYCHIATRIC: No anxiety. No insomnia. No ADD.    Vitals:   Vitals:   07/12/17 1055 07/12/17 1105 07/12/17 1125 07/12/17 1211  BP:  (!) 126/50 120/60 (!) 124/58  Pulse: 67 68 66 66  Resp: Temp: 97.7 F (36.5 C)     TempSrc:   Oral Oral  SpO2:  98%  98%  Weight:      Height:        Wt Readings from Last 3 Encounters:  07/12/17 159 lb 14.4 oz (72.5 kg)     Intake/Output Summary (Last 24 hours) at 07/12/17 1217 Last data filed at 07/12/17 1100  Gross per 24 hour  Intake          1251.41 ml  Output             1560 ml  Net           -308.59 ml    Physical Exam:   GENERAL: Pleasant-appearing in no apparent distress.  HEAD, EYES, EARS, NOSE AND THROAT: Atraumatic, normocephalic. Extraocular muscles are intact. Pupils equal and reactive to light. Sclerae anicteric. No conjunctival injection. No oro-pharyngeal erythema.  NECK: Supple. There is no jugular venous distention. No bruits, no lymphadenopathy, no thyromegaly.  HEART: Regular rate and rhythm,. No murmurs, no rubs, no clicks.  LUNGS: Clear to  auscultation bilaterally. No rales or rhonchi. No wheezes.  ABDOMEN: Soft, flat, nontender, nondistended. Has good bowel sounds. No hepatosplenomegaly appreciated.  EXTREMITIES:her right foot dressing in place NEUROLOGIC: The patient is alert, awake, and oriented x3 with no focal motor or sensory deficits appreciated bilaterally.  SKIN: Moist and warm with no rashes appreciated.  Psych: Not anxious, depressed LN: No inguinal LN enlargement    Antibiotics   Anti-infectives    Start     Dose/Rate Route Frequency Ordered Stop   07/11/17 0845  ceFAZolin (ANCEF) IVPB 2g/100 mL premix  Status:  Discontinued     2 g 200 mL/hr over 30 Minutes Intravenous On call to O.R. 07/11/17 0840 07/12/17 0559   07/11/17 0000  vancomycin (VANCOCIN) IVPB 1000 mg/200 mL premix     1,000 mg 200 mL/hr over 60 Minutes Intravenous Every 12 hours 07/10/17 1857     07/10/17 2200  piperacillin-tazobactam (ZOSYN) IVPB 3.375 g     3.375 g 12.5 mL/hr over 240 Minutes Intravenous Every 8 hours 07/10/17 1857     07/10/17 1645  vancomycin (VANCOCIN) IVPB 1000 mg/200 mL premix     1,000 mg 200 mL/hr over 60 Minutes Intravenous  Once 07/10/17 1630 07/10/17 1817   07/10/17 1645  piperacillin-tazobactam (ZOSYN) IVPB 3.375 g     3.375 g 100 mL/hr over 30 Minutes Intravenous  Once 07/10/17 1630 07/10/17 1733      Medications   Scheduled Meds: . busPIRone  7.5 mg Oral BID  . Chlorhexidine Gluconate Cloth  6 each Topical Daily  . gabapentin  300 mg  Oral TID  . lacosamide  50 mg Oral BID  . mupirocin ointment  1 application Nasal BID  . nicotine  21 mg Transdermal Daily  . oxyCODONE  30 mg Oral Q8H  . sotalol  80 mg Oral Daily  . tiotropium  1 capsule Inhalation Daily  . zolpidem  5 mg Oral QHS   Continuous Infusions: . piperacillin-tazobactam (ZOSYN)  IV 3.375 g (07/12/17 0535)  . vancomycin Stopped (07/12/17 0135)   PRN Meds:.acetaminophen **OR** acetaminophen, hydrALAZINE, HYDROcodone-acetaminophen, morphine injection, ondansetron **OR** ondansetron (ZOFRAN) IV, oxyCODONE-acetaminophen   Data Review:   Micro Results Recent Results (from the past 240 hour(s))  Wound or Superficial Culture     Status: None (Preliminary result)   Collection Time: 07/10/17  4:30 PM  Result Value Ref Range Status   Specimen Description FOOT  Final   Special Requests NONE  Final   Gram Stain   Final    RARE WBC SEEN FEW GRAM POSITIVE COCCI Performed at [facility], [address] [place], [place] [address]    Culture MODERATE STAPHYLOCOCCUS AUREUS  Final   Report Status PENDING  Incomplete   Organism ID, Bacteria STAPHYLOCOCCUS AUREUS  Final      Susceptibility   Staphylococcus aureus - MIC*    CIPROFLOXACIN 1 SENSITIVE Sensitive     ERYTHROMYCIN <=0.25 SENSITIVE Sensitive     GENTAMICIN <=0.5 SENSITIVE Sensitive     OXACILLIN 0.5 SENSITIVE Sensitive     TETRACYCLINE <=1 SENSITIVE Sensitive     VANCOMYCIN <=0.5 SENSITIVE Sensitive     TRIMETH/SULFA <=10 SENSITIVE Sensitive     CLINDAMYCIN <=0.25 SENSITIVE Sensitive     RIFAMPIN <=0.5 SENSITIVE Sensitive     Inducible Clindamycin NEGATIVE Sensitive     * MODERATE STAPHYLOCOCCUS AUREUS  Culture, blood (routine x 2)     Status: None (Preliminary result)   Collection Time: 07/10/17  4:30 PM  Result  Value Ref Range Status   Specimen Description BLOOD BLOOD LEFT FOREARM  Final   Special Requests   Final    BOTTLES DRAWN AEROBIC AND ANAEROBIC Blood Culture results may not be  optimal due to an excessive volume of blood received in culture bottles   Culture NO GROWTH 2 DAYS  Final   Report Status PENDING  Incomplete  Culture, blood (routine x 2)     Status: None (Preliminary result)   Collection Time: 07/10/17  4:30 PM  Result Value Ref Range Status   Specimen Description BLOOD LEFT ANTECUBITAL  Final   Special Requests   Final    BOTTLES DRAWN AEROBIC AND ANAEROBIC Blood Culture results may not be optimal due to an excessive volume of blood received in culture bottles   Culture NO GROWTH 2 DAYS  Final   Report Status PENDING  Incomplete  Surgical pcr screen     Status: Abnormal   Collection Time: 07/10/17  9:05 PM  Result Value Ref Range Status   MRSA, PCR NEGATIVE NEGATIVE Final   Staphylococcus aureus POSITIVE (A) NEGATIVE Final    Comment: (NOTE) The Xpert SA Assay (FDA approved for NASAL specimens in patients 77 years of age and older), is one component of a comprehensive surveillance program. It is not intended to diagnose infection nor to guide or monitor treatment.     Radiology Reports Dg Chest 2 View  Result Date: 07/11/2017 CLINICAL DATA:  PT was scheduled for foot debridement today and procedure was delayed due to hyponatremia. C/o weakness, h/o afib, CAD, COPD EXAM: CHEST  2 VIEW COMPARISON:  08/03/2013 FINDINGS: Cardiac silhouette is normal in size and configuration. No mediastinal or hilar masses. No evidence of adenopathy. Mild interstitial thickening in the lung bases. Lungs otherwise clear. No convincing pleural effusion.  No pneumothorax. Skeletal structures are demineralized but grossly intact. IMPRESSION: No acute cardiopulmonary disease. Electronically Signed   By: Amie Portland M.D.   On: 07/11/2017 19:59   Dg Foot Complete Right  Result Date: 07/10/2017 CLINICAL DATA:  Evaluate for osteomyelitis. EXAM: RIGHT FOOT COMPLETE - 3+ VIEW COMPARISON:  Mar 04, 2006. FINDINGS: Postsurgical changes related to prior first TMT arthrodesis with  fractures of several screws, fracture of the screw plate, and backing out of the second most distal screw. No significant osseous fusion across the first TMT joint. Healed fracture deformities of the second through fourth metatarsal necks. No acute fracture. There is focal lucency and cortical disruption along the proximal first metatarsal. The bones are diffusely osteopenic. Atherosclerotic vascular calcifications. IMPRESSION: 1. Postsurgical changes related to prior first TMT arthrodesis with multiple fractured screws, fracturing of the screw plate, and backing out of the second most distal screw. 2. Focal lucency and cortical destruction along the proximal first metatarsal, concerning for osteomyelitis. Electronically Signed   By: Obie Dredge M.D.   On: 07/10/2017 17:17     CBC  Recent Labs Lab 07/10/17 1630 07/11/17 0524 07/12/17 0830  WBC 15.9* 12.0* 11.1*  HGB 12.1* 10.4* 10.9*  HCT 34.9* 30.0* 32.0*  PLT 310 218 236  MCV 89.7 88.2 88.5  MCH 31.1 30.6 30.2  MCHC 34.7 34.7 34.1  RDW 14.5 14.5 14.5  LYMPHSABS 1.6  --   --   MONOABS 1.2*  --   --   EOSABS 0.2  --   --   BASOSABS 0.2*  --   --     Chemistries   Recent Labs Lab 07/10/17 1630 07/11/17 0524 07/11/17 1452 07/11/17  2140 07/12/17 0422  NA 131* 129* 128* 132* 135  K 4.4 3.9 3.6  --  4.2  CL 98* 98* 98*  --  102  CO2 --  26  GLUCOSE 109* 92 93  --  94  BUN --  13  CREATININE 1.04 1.30* 1.33*  --  1.30*  CALCIUM 9.1 8.0* 7.9*  --  8.3*  AST 24  --   --   --   --   ALT 14*  --   --   --   --   ALKPHOS 86  --   --   --   --   BILITOT 1.0  --   --   --   --    ------------------------------------------------------------------------------------------------------------------ estimated creatinine clearance is 54.2 mL/min (A) (by C-G formula based on SCr of 1.3 mg/dL (H)). ------------------------------------------------------------------------------------------------------------------ No  results for input(s): HGBA1C in the last 72 hours. ------------------------------------------------------------------------------------------------------------------ No results for input(s): CHOL, HDL, LDLCALC, TRIG, CHOLHDL, LDLDIRECT in the last 72 hours. ------------------------------------------------------------------------------------------------------------------  Recent Labs  07/11/17 2140  TSH 2.397   ------------------------------------------------------------------------------------------------------------------ No results for input(s): VITAMINB12, FOLATE, FERRITIN, TIBC, IRON, RETICCTPCT in the last 72 hours.  Coagulation profile  Recent Labs Lab 07/10/17 2113  INR 1.48    No results for input(s): DDIMER in the last 72 hours.  Cardiac Enzymes No results for input(s): CKMB, TROPONINI, MYOGLOBIN in the last 168 hours.  Invalid input(s): CK ------------------------------------------------------------------------------------------------------------------ Invalid input(s): POCBNP    Assessment & Plan   Pt.71 year old male patient with acute osteomyelitis of right first metatarsal.  1. Right foot first metatarsal osteomyelitis with infected ulcer dorsal right foot with exposed hardware : Continue IV antibiotics with vancomycin and  Zosyn Plan for surgery  Later today  2. 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 diabetes  Diet control  Blood glucose stable  4.  history of seizure disorder: Continue Vimpat  5. chronic atrial fibrillation continue sotalol resume heparin , xarelto if stable  6. .Chronic pain syndrome, patient has severe right foot pain because of osteomyelitis. Continue OxyContin BuSpar, Neurontin  7.  COPD continue Spiriva  8. Nicotine abuse smoking cessation provided,      Code Status Orders        Start     Ordered   07/10/17 1840  Full code  Continuous     07/10/17 1842    Code Status History     Date Active Date Inactive Code Status Order ID Comments User Context   This patient has a current code status but no historical code status.           Consults  podiatry  DVT Prophylaxis heparin  Lab Results  Component Value Date   PLT 236 07/12/2017     Time Spent in minutes   Greater than 50% of time spent in care coordination and counseling patient regarding the condition and plan of care.   Auburn Bilberry M.D on 07/12/2017 at 12:17 PM  Between 7am to 6pm - Pager - [phone]  After 6pm go to [email or web address] - password [email or web address] [facility]  [facility]   Office  [phone]

## 2017-07-12 NOTE — Progress Notes (Signed)
ANTICOAGULATION CONSULT NOTE - Follow up   Pharmacy Consult for Heparin  Indication: atrial fibrillation  Allergies  Allergen Reactions  . Keppra [Levetiracetam] Rash    Patient Measurements: Height:  (177.8 cm) Weight: 159 lb 14.4 oz (72.5 kg) IBW/kg (Calculated) : 73 Heparin Dosing Weight: 70.7 kg   Vital Signs: Temp: 98.2 F (36.8 C) (09/14 2001) Temp Source: Oral (09/14 2001) BP: 122/40 (09/14 2001) Pulse Rate: 72 (09/14 2001)  Labs:  Recent Labs  07/10/17 1630 07/10/17 2113 07/11/17 0524 07/11/17 0729 07/11/17 1452 07/12/17 0422 07/12/17 0830 07/12/17 2120  HGB 12.1*  --  10.4*  --   --   --  10.9*  --   HCT 34.9*  --  30.0*  --   --   --  32.0*  --   PLT 310  --  218  --   --   --  236  --   APTT  --  37*  --   --   --   --   --   --   LABPROT  --  17.8*  --   --   --   --   --   --   INR  --  1.48  --   --   --   --   --   --   HEPARINUNFRC  --   --   --  0.57 0.13*  --   --  0.28*  CREATININE 1.04  --  1.30*  --  1.33* 1.30*  --   --     Estimated Creatinine Clearance: 54.2 mL/min (A) (by C-G formula based on SCr of 1.3 mg/dL (H)).  Assessment: Pharmacy consulted to dose heparin in this 71 year old male with AFib.   Pt was on Xarelto at home, last dose was on 9/11.  Goal of Therapy:  Heparin level 0.3-0.7 units/ml Monitor platelets by anticoagulation protocol: Yes   Plan:  Will order baseline aPTT and INR.  Give 4200 units bolus x 1 Start heparin infusion at 1050 units/hr Check anti-Xa level in 6 hours and daily while on heparin Continue to monitor H&H and platelets  9/13 0729 HL 0.57 Recheck HL in 6 hours and follow up, continue current rate of 1050u/hr  09/13 1700 Pharmacy instructed to reorder heparin drip with stop time of 0400 on 9/14 for surgery. Drip was reordered w/o bolus with appropriate stop time ordered. Nurse made aware of heparin plan.   0914 1230 Pt post-procedure. MD has reordered heparin drip at 1100 units/hr. Will  decrease rate to 1050 units/hr because last therapeutic level was on that rate (see above). No heparin levels obtained while on 1100 units/hr. Will order heparin level 8h after restart of drip CBC in AM. Hgb and plt count stable from yesterday.  Heparin order currently with a stop time, after discussing with hospitalist, will reorder without stop time to run continuously.   9/14:  HL @ 21:00 = 0.28 Will order Heparin 1000 units IV X 1 and increase drip rate to 1200 units/hr.  Will recheck HL 8 hrs after rate change.   Pharmacy will continue to follow.    Scherrie Gerlach, PharmD Clinical Pharmacist 07/12/2017 10:13 PM

## 2017-07-12 NOTE — Consult Note (Signed)
[facility] Vascular Consult Note  MRN : [id number]  Stanley Nguyen is a 71 y.o. (Nov 15, 1945) male who presents with chief complaint of  Chief Complaint  Patient presents with  . Wound Infection  .  History of Present Illness: I am asked by Dr. Sampson Goon to see the patient regarding right foot ulceration and infection.  The patient was asked to take to the operating room earlier today by Dr. Ether Griffins where debridement and removal of hardware was performed. 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 He is going to be getting extended intravenous antibiotics. He has a long history of atherosclerotic disease and has had multiple previous coronary stents placed. He was not really walking well enough to determine whether or not he had significant lifestyle limiting claudication. He does not describe ischemic rest pain. When asked to be severed had lower extremity work on his legs, he answers "they put stents up down and all around"  although I do not see any operative notes immediately available. No current left leg symptoms of pain or ulceration. No obvious inciting event that started the process.  Current Facility-Administered Medications  Medication Dose Route Frequency Provider Last Rate Last Dose  . acetaminophen (TYLENOL) tablet 650 mg  650 mg Oral Q6H PRN Katha Hamming, MD       Or  . acetaminophen (TYLENOL) suppository 650 mg  650 mg Rectal Q6H PRN Katha Hamming, MD      . Ampicillin-Sulbactam (UNASYN) 3 g in sodium chloride 0.9 % 100 mL IVPB  3 g Intravenous Q6H Mick Sell, MD   Stopped at 07/12/17 1524  . busPIRone (BUSPAR) tablet 7.5 mg  7.5 mg Oral BID Katha Hamming, MD   7.5 mg at 07/12/17 1218  . Chlorhexidine Gluconate Cloth 2 % PADS 6 each  6 each Topical Daily Auburn Bilberry, MD      . gabapentin (NEURONTIN) capsule 300 mg  300 mg Oral TID Katha Hamming, MD   300 mg at 07/11/17  2054  . heparin ADULT infusion 100 units/mL (25000 units/295mL sodium chloride 0.45%)  1,050 Units/hr Intravenous Continuous Auburn Bilberry, MD 10.5 mL/hr at 07/12/17 1523 1,050 Units/hr at 07/12/17 1523  . hydrALAZINE (APRESOLINE) injection 10 mg  10 mg Intravenous Q6H PRN Auburn Bilberry, MD   10 mg at 07/12/17 0825  . HYDROcodone-acetaminophen (NORCO/VICODIN) 5-325 MG per tablet 1-2 tablet  1-2 tablet Oral Q4H PRN Katha Hamming, MD   2 tablet at 07/12/17 0406  . lacosamide (VIMPAT) tablet 50 mg  50 mg Oral BID Katha Hamming, MD   50 mg at 07/12/17 1219  . morphine 2 MG/ML injection 2 mg  2 mg Intravenous Q4H PRN Katha Hamming, MD   2 mg at 07/12/17 0042  . mupirocin ointment (BACTROBAN) 2 % 1 application  1 application Nasal BID Auburn Bilberry, MD   1 application at 07/12/17 1220  . nicotine (NICODERM CQ - dosed in mg/24 hours) patch 21 mg  21 mg Transdermal Daily Gwyneth Revels, DPM   21 mg at 07/12/17 1229  . ondansetron (ZOFRAN) tablet 4 mg  4 mg Oral Q6H PRN Katha Hamming, MD       Or  . ondansetron (ZOFRAN) injection 4 mg  4 mg Intravenous Q6H PRN Katha Hamming, MD      . oxyCODONE (OXYCONTIN) 12 hr tablet 30 mg  30 mg Oral Q8H Katha Hamming, MD   30 mg at 07/12/17 1323  . oxyCODONE-acetaminophen (  PERCOCET/ROXICET) 5-325 MG per tablet 1-2 tablet  1-2 tablet Oral Q4H PRN Gwyneth Revels, DPM      . polyethylene glycol (MIRALAX / GLYCOLAX) packet 17 g  17 g Oral Daily Auburn Bilberry, MD      . sotalol (BETAPACE) tablet 80 mg  80 mg Oral Daily Katha Hamming, MD   80 mg at 07/12/17 0825  . tiotropium (SPIRIVA) inhalation capsule 18 mcg  1 capsule Inhalation Daily Katha Hamming, MD   18 mcg at 07/12/17 1220  . zolpidem (AMBIEN) tablet 5 mg  5 mg Oral QHS Katha Hamming, MD   5 mg at 07/11/17 2054    Past Medical History:  Diagnosis Date  . Atrial fibrillation (HCC)   . CAD (coronary artery disease)   . COPD (chronic obstructive  pulmonary disease) (HCC)   . DM (diabetes mellitus) (HCC)    diet controlled  . Liver cirrhosis (HCC)   . Liver cirrhosis (HCC)   . Seizure disorder [facility])     History reviewed. No pertinent surgical history.  Social History Social History  Substance Use Topics  . Smoking status: Current Every Day Smoker    Packs/day: 1.00    Types: Cigarettes  . Smokeless tobacco: Never Used  . Alcohol use No  No IV drug use  Family History No bleeding disorders, clotting disorders, autoimmune diseases, or aneurysms  Allergies  Allergen Reactions  . Keppra [Levetiracetam] Rash     REVIEW OF SYSTEMS (Negative unless checked)  Constitutional: Weight loss  Fever  Chills Cardiac: Chest pain   Chest pressure   Palpitations   Shortness of breath when laying flat   Shortness of breath at rest   Shortness of breath with exertion. Vascular:  Pain in legs with walking   Pain in legs at rest   Pain in legs when laying flat   Claudication   Pain in feet when walking  Pain in feet at rest  Pain in feet when laying flat   History of DVT   Phlebitis   Swelling in legs   Varicose veins   Non-healing ulcers Pulmonary:   Uses home oxygen   Productive cough   Hemoptysis   Wheeze  COPD   Asthma Neurologic:  Dizziness  Blackouts   Seizures   History of stroke   History of TIA  Aphasia   Temporary blindness   Dysphagia   Weakness or numbness in arms   Weakness or numbness in legs Musculoskeletal:  Arthritis   Joint swelling   Joint pain   Low back pain Hematologic:  Easy bruising  Easy bleeding   Hypercoagulable state   Anemic  Hepatitis Gastrointestinal:  Blood in stool   Vomiting blood  Gastroesophageal reflux/heartburn   Difficulty swallowing. Genitourinary:  Chronic kidney disease   Difficult urination  Frequent urination  Burning with urination   Blood in urine Skin:  Rashes   Ulcers    Wounds Psychological:  History of anxiety    History of major depression.  Physical Examination  Vitals:   07/12/17 1105 07/12/17 1125 07/12/17 1211 07/12/17 1518  BP: (!) 126/50 120/60 (!) 124/58 (!) 145/62  Pulse: 68 66 66 63  Resp: Temp:    97.9 F (36.6 C)  TempSrc:  Oral Oral Oral  SpO2: 98%  98% 100%  Weight:      Height:       Body mass index is 22.94 kg/m. Gen:  WD/WN, NAD. Appears older than stated age. Head:  [place]/AT, No temporalis wasting Ear/Nose/Throat: Hearing grossly intact, nares w/o erythema or drainage, oropharynx w/o Erythema/Exudate Eyes: Sclera non-icteric, conjunctiva clear Neck: Trachea midline.  No JVD.  Pulmonary:  Good air movement, respirations not labored, equal bilaterally.  Cardiac: irregularly irregular Vascular:  Vessel Right Left  Radial Palpable Palpable                          PT Trace Palpable 1+ Palpable  DP Not Palpable Not Palpable    Musculoskeletal: M/S 5/5 throughout.  Wound VAC and gauze wrap is present around the right foot and ankle. Capillary refill is fair at the toes. Neurologic: Sensation grossly intact in extremities.  Symmetrical.  Speech is fluent. Motor exam as listed above. Psychiatric: Judgment and insight appear intact. Fair historian Dermatologic: Wound on the right foot is currently dressed with a negative pressure dressing      CBC Lab Results  Component Value Date   WBC 11.1 (H) 07/12/2017   HGB 10.9 (L) 07/12/2017   HCT 32.0 (L) 07/12/2017   MCV 88.5 07/12/2017   PLT 236 07/12/2017    BMET    Component Value Date/Time   NA 136 07/12/2017 1234   NA 132 (L) 08/04/2013 0333   K 4.2 07/12/2017 0422   K 4.4 08/04/2013 0333   CL 102 07/12/2017 0422   CL 100 08/04/2013 0333   CO2 26 07/12/2017 0422   CO2 27 08/04/2013 0333   GLUCOSE 94 07/12/2017 0422   GLUCOSE 69 08/04/2013 0333   BUN 13 07/12/2017 0422   BUN 3 (L) 08/04/2013 0333   CREATININE 1.30 (H) 07/12/2017 0422    CREATININE 0.87 08/04/2013 0333   CALCIUM 8.3 (L) 07/12/2017 0422   CALCIUM 7.6 (L) 08/04/2013 0333   GFRNONAA 54 (L) 07/12/2017 0422   GFRNONAA >60 08/04/2013 0333   GFRAA >60 07/12/2017 0422   GFRAA >60 08/04/2013 0333   Estimated Creatinine Clearance: 54.2 mL/min (A) (by C-G formula based on SCr of 1.3 mg/dL (H)).  COAG Lab Results  Component Value Date   INR 1.48 07/10/2017   INR 2.1 07/31/2013    Radiology Dg Chest 2 View  Result Date: 07/11/2017 CLINICAL DATA:  PT was scheduled for foot debridement today and procedure was delayed due to hyponatremia. C/o weakness, h/o afib, CAD, COPD EXAM: CHEST  2 VIEW COMPARISON:  08/03/2013 FINDINGS: Cardiac silhouette is normal in size and configuration. No mediastinal or hilar masses. No evidence of adenopathy. Mild interstitial thickening in the lung bases. Lungs otherwise clear. No convincing pleural effusion.  No pneumothorax. Skeletal structures are demineralized but grossly intact. IMPRESSION: No acute cardiopulmonary disease. Electronically Signed   By: Amie Portland M.D.   On: 07/11/2017 19:59   Dg Foot Complete Right  Result Date: 07/10/2017 CLINICAL DATA:  Evaluate for osteomyelitis. EXAM: RIGHT FOOT COMPLETE - 3+ VIEW COMPARISON:  Mar 04, 2006. FINDINGS: Postsurgical changes related to prior first TMT arthrodesis with fractures of several screws, fracture of the screw plate, and backing out of the second most distal screw. No significant osseous fusion across the first TMT joint. Healed fracture deformities of the second through fourth metatarsal necks. No acute fracture. There is focal lucency and cortical disruption along the proximal first metatarsal. The bones are diffusely osteopenic. Atherosclerotic vascular calcifications. IMPRESSION: 1. Postsurgical changes related to prior first TMT arthrodesis with multiple fractured screws, fracturing of the screw plate, and backing out of the second most distal screw. 2. Focal lucency  and  cortical destruction along the proximal first metatarsal, concerning for osteomyelitis. Electronically Signed   By: Obie Dredge M.D.   On: 07/10/2017 17:17      Assessment/Plan 1. Ulceration and infection right foot with suspected PAD. Noninvasive studies at our institution are basically unavailable at this point. CT angiogram is very poor for evaluating the distal vessels and does not allow any treatment so I would recommend proceeding with angiogram of the right lower extremity. I discussed the risks and benefits of the procedure. Discussed with patient that any lesion that is amenable to endovascular therapy would be treated at the time of the procedure. I scheduled this for Monday, September 17. Patient is agreeable to proceed.  2. Infection/osteomyelitis right foot. Getting a PICC line today that extended IV antibiotics after discharge. Perfusion will need to be optimized to obtain wound healing.  3. Diabetes. Stable and blood glucose control important in reducing the progression of atherosclerotic disease. Also, involved in wound healing. On appropriate medications. 4. Atrial fibrillation/coronary artery disease. Angiogram would be a low risk procedure and further cardiac evaluation is likely not warranted at this point. We will monitor his rhythm during the procedure. He could stay on heparin until the time of the procedure and we will get intravenous boluses of heparin during the procedure. Oral anticoagulation restarted after the procedure.  Festus Barren, MD  07/12/2017 4:07 PM    This note was created with Dragon medical transcription system.  Any error is purely unintentional

## 2017-07-12 NOTE — Progress Notes (Signed)
Pt BP 175/66. MD Allena Katz notified. MD placed order for PRN 10 mg Hydralazine.

## 2017-07-12 NOTE — Progress Notes (Signed)
Pt is NPO for surgery this am . Heparin stopped at 0522. Up most of the night. Pain  uncontrolled

## 2017-07-12 NOTE — Anesthesia Procedure Notes (Signed)
Procedure Name: LMA Insertion Performed by: Shalita Notte Pre-anesthesia Checklist: Patient identified, Patient being monitored, Timeout performed, Emergency Drugs available and Suction available Patient Re-evaluated:Patient Re-evaluated prior to induction Oxygen Delivery Method: Circle system utilized Preoxygenation: Pre-oxygenation with 100% oxygen Induction Type: IV induction Ventilation: Mask ventilation without difficulty LMA: LMA inserted LMA Size: 4.0 Tube type: Oral Number of attempts: 1 Placement Confirmation: positive ETCO2 and breath sounds checked- equal and bilateral Tube secured with: Tape Dental Injury: Teeth and Oropharynx as per pre-operative assessment        

## 2017-07-12 NOTE — Anesthesia Preprocedure Evaluation (Addendum)
Anesthesia Evaluation  Patient identified by MRN, date of birth, ID band Patient awake    Reviewed: Allergy & Precautions, NPO status , Patient's Chart, lab work & pertinent test results  Airway Mallampati: II  TM Distance: >3 FB     Dental  (+) Upper Dentures, Lower Dentures   Pulmonary COPD, Current Smoker,    Pulmonary exam normal        Cardiovascular + CAD  Normal cardiovascular exam+ dysrhythmias Atrial Fibrillation      Neuro/Psych Seizures -, Well Controlled,  negative psych ROS   GI/Hepatic (+) Cirrhosis       ,   Endo/Other  diabetes, Well Controlled, Type 2  Renal/GU      Musculoskeletal   Abdominal Normal abdominal exam  (+)   Peds  Hematology   Anesthesia Other Findings   Reproductive/Obstetrics                            Anesthesia Physical Anesthesia Plan  ASA: III  Anesthesia Plan: General   Post-op Pain Management:    Induction: Intravenous  PONV Risk Score and Plan:   Airway Management Planned: LMA  Additional Equipment:   Intra-op Plan:   Post-operative Plan: Extubation in OR  Informed Consent: I have reviewed the patients History and Physical, chart, labs and discussed the procedure including the risks, benefits and alternatives for the proposed anesthesia with the patient or authorized representative who has indicated his/her understanding and acceptance.   Dental advisory given  Plan Discussed with: CRNA and Surgeon  Anesthesia Plan Comments:        Anesthesia Quick Evaluation

## 2017-07-12 NOTE — Transfer of Care (Signed)
Immediate Anesthesia Transfer of Care Note  Patient: Stanley Nguyen  Procedure(s) Performed: Procedure(s): IRRIGATION AND DEBRIDEMENT FOOT (Right)  Patient Location: PACU  Anesthesia Type:General  Level of Consciousness: awake, alert  and oriented  Airway & Oxygen Therapy: Patient Spontanous Breathing and Patient connected to face mask oxygen  Post-op Assessment: Report given to RN and Post -op Vital signs reviewed and stable  Post vital signs: Reviewed and stable  Last Vitals:  Vitals:   07/12/17 0807 07/12/17 1021  BP: (!) 172/66   Pulse: 61 66  Resp:  14  Temp:  (!) 35.8 C  SpO2: 99% 100%    Last Pain:  Vitals:   07/12/17 0858  TempSrc:   PainSc: 7          Complications: No apparent anesthesia complications

## 2017-07-12 NOTE — Op Note (Addendum)
Operative note   Surgeon:Marriah Sanderlin    Assistant:none    Preop diagnosis:infected ulcer dorsal right foot with exposed hardware    Postop diagnosis:infected ulcer to bone dorsal right foot 2. Infected hardware dorsal right foot    Procedure:1. Debridement excisional to bone dorsal and intramedullary right first metatarsal 2. Removal of infected hardware dorsal right foot    ZOX:[id number]    Anesthesia:local and general    Hemostasis:epinephrine 1 200,000 infiltrated along the incision site    Specimen:infected bone first metatarsal and deep culture right foot    Complications:none    Operative indications:Stanley Nguyen is an 71 y.o. that presents today for surgical intervention.  The risks/benefits/alternatives/complications have been discussed and consent has been given.    Procedure:  Patient was brought into the OR and placed on the operating table in thesupine position. After anesthesia was obtained theright lower extremity was prepped and draped in usual sterile fashion.  Attention was directed to the dorsal aspect of the right foot where there was a noted full-thickness ulceration with infection with exposed hardware. Multiple exposed screws were noted. The long extensor tendon to the great toe was noted to be exposed to the wound. This was markedly infected. At this time the extensor tendon was excised to proximal distal aspect of the ulcerative site. The ulceration was measured at approximately 4 cm x 2 cm. The proximal aspect of the dorsal wound was incised proximal approximately 2 cm. The extensor tendon was noted in this area as well and excised with a scant amount of purulent drainage. At this time excisional full-thickness debridement a versa jet was performed.debridement was to bone and including bone. The fragmented plate along the dorsal first metatarsal and cuneiform was noted and removed along with all screws. A total of 8 screws were removed. 2 screws were partial and had  fractured. The plate along the dorsal aspect of the first metatarsa had eroded into the dorsal first metatarsal itself.  The dorsal cortex was opened and debridement of the bone was further performed with a curet. Fragments of the bone were sent for pathological examination. The wound was flushed with copious amounts of irrigation. Final evaluation revealed an ulceration approximately 5 x 2 and half centimeters. Exposed bone was noted. At this time a wound VAC with silver impregnated foam was placed along the dorsal incision ulcerative site.    Patient tolerated the procedure and anesthesia well.  Was transported from the OR to the PACU with all vital signs stable and vascular status intact. To be discharged per routine protocol.  Will follow up in approximately 1 week in the outpatient clinic.

## 2017-07-12 NOTE — Progress Notes (Addendum)
ANTICOAGULATION CONSULT NOTE - Follow up   Pharmacy Consult for Heparin  Indication: atrial fibrillation  Allergies  Allergen Reactions  . Keppra [Levetiracetam] Rash    Patient Measurements: Height:  (177.8 cm) Weight: 159 lb 14.4 oz (72.5 kg) IBW/kg (Calculated) : 73 Heparin Dosing Weight: 70.7 kg   Vital Signs: Temp: 97.7 F (36.5 C) (09/14 1055) Temp Source: Oral (09/14 1211) BP: 124/58 (09/14 1211) Pulse Rate: 66 (09/14 1211)  Labs:  Recent Labs  07/10/17 1630 07/10/17 2113 07/11/17 0524 07/11/17 0729 07/11/17 1452 07/12/17 0422 07/12/17 0830  HGB 12.1*  --  10.4*  --   --   --  10.9*  HCT 34.9*  --  30.0*  --   --   --  32.0*  PLT 310  --  218  --   --   --  236  APTT  --  37*  --   --   --   --   --   LABPROT  --  17.8*  --   --   --   --   --   INR  --  1.48  --   --   --   --   --   HEPARINUNFRC  --   --   --  0.57 0.13*  --   --   CREATININE 1.04  --  1.30*  --  1.33* 1.30*  --     Estimated Creatinine Clearance: 54.2 mL/min (A) (by C-G formula based on SCr of 1.3 mg/dL (H)).  Assessment: Pharmacy consulted to dose heparin in this 71 year old male with AFib.   Pt was on Xarelto at home, last dose was on 9/11.  Goal of Therapy:  Heparin level 0.3-0.7 units/ml Monitor platelets by anticoagulation protocol: Yes   Plan:  Will order baseline aPTT and INR.  Give 4200 units bolus x 1 Start heparin infusion at 1050 units/hr Check anti-Xa level in 6 hours and daily while on heparin Continue to monitor H&H and platelets  9/13 0729 HL 0.57 Recheck HL in 6 hours and follow up, continue current rate of 1050u/hr  09/13 1700 Pharmacy instructed to reorder heparin drip with stop time of 0400 on 9/14 for surgery. Drip was reordered w/o bolus with appropriate stop time ordered. Nurse made aware of heparin plan.   0914 1230 Pt post-procedure. MD has reordered heparin drip at 1100 units/hr. Will decrease rate to 1050 units/hr because last therapeutic level  was on that rate (see above). No heparin levels obtained while on 1100 units/hr. Will order heparin level 8h after restart of drip CBC in AM. Hgb and plt count stable from yesterday.  Heparin order currently with a stop time, after discussing with hospitalist, will reorder without stop time to run continuously.   Pharmacy will continue to follow.    Marty Heck, PharmD, BCPS Clinical Pharmacist 07/12/2017 12:43 PM

## 2017-07-12 NOTE — Progress Notes (Signed)
[place] INFECTIOUS DISEASE PROGRESS NOTE Date of Admission:  07/10/2017     ID: Stanley Nguyen is a 71 y.o. 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 Had excision of infected extensor tendon  ROS  Eleven systems are reviewed and negative except per hpi  Medications:  Antibiotics Given (last 72 hours)    Date/Time Action Medication Dose Rate   07/10/17 1703 New Bag/Given   vancomycin (VANCOCIN) IVPB 1000 mg/200 mL premix 1,000 mg 200 mL/hr   07/10/17 1703 New Bag/Given   piperacillin-tazobactam (ZOSYN) IVPB 3.375 g 3.375 g 100 mL/hr   07/10/17 2218 New Bag/Given   piperacillin-tazobactam (ZOSYN) IVPB 3.375 g 3.375 g 12.5 mL/hr   07/11/17 0003 New Bag/Given   vancomycin (VANCOCIN) IVPB 1000 mg/200 mL premix 1,000 mg 200 mL/hr   07/11/17 0617 New Bag/Given   piperacillin-tazobactam (ZOSYN) IVPB 3.375 g 3.375 g 12.5 mL/hr   07/11/17 1317 New Bag/Given   vancomycin (VANCOCIN) IVPB 1000 mg/200 mL premix 1,000 mg 200 mL/hr   07/11/17 1341 New Bag/Given   piperacillin-tazobactam (ZOSYN) IVPB 3.375 g 3.375 g 12.5 mL/hr   07/11/17 2053 New Bag/Given   piperacillin-tazobactam (ZOSYN) IVPB 3.375 g 3.375 g 12.5 mL/hr   07/12/17 0035 New Bag/Given   vancomycin (VANCOCIN) IVPB 1000 mg/200 mL premix 1,000 mg 200 mL/hr   07/12/17 0535 New Bag/Given   piperacillin-tazobactam (ZOSYN) IVPB 3.375 g 3.375 g 12.5 mL/hr     . busPIRone  7.5 mg Oral BID  . Chlorhexidine Gluconate Cloth  6 each Topical Daily  . gabapentin  300 mg Oral TID  . lacosamide  50 mg Oral BID  . mupirocin ointment  1 application Nasal BID  . nicotine  21 mg Transdermal Daily  . oxyCODONE  30 mg Oral Q8H  . sotalol  80 mg Oral Daily  . tiotropium  1 capsule Inhalation Daily  . zolpidem  5 mg Oral QHS    Objective: Vital signs in last 24 hours: Temp:  [96.5 F (35.8  C)-97.9 F (36.6 C)] 97.7 F (36.5 C) (09/14 1055) Pulse Rate:  [53-68] 66 (09/14 1125) Resp:  [11-18] 18 (09/14 1125) BP: (88-178)/(50-83) 120/60 (09/14 1125) SpO2:  [98 %-100 %] 98 % (09/14 1105) Weight:  [72.5 kg (159 lb 14.4 oz)] 72.5 kg (159 lb 14.4 oz) (09/14 0500) Constitutional: He is oriented to person, place, and time.thin, frail  HENT: anicteric  Mouth/Throat: Oropharynx is clear and moist. No oropharyngeal exudate.  Cardiovascular: Normal rate, regular rhythm and normal heart sounds. Pulmonary/Chest: Effort normal and breath sounds normal. No respiratory distress. He has no wheezes.  Abdominal: Soft. Bowel sounds are normal. He exhibits no distension. There is no tenderness.  Lymphadenopathy: He has no cervical adenopathy.  Neurological: He is alert and oriented to person, place, and time.  Skin: Derm:The dorsal aspect of his right foot has a wound vac in place Psychiatric: He has a normal mood and affect. His behavior is normal.   Lab Results  Recent Labs  07/11/17 0524 07/11/17 1452 07/11/17 2140 07/12/17 0422 07/12/17 0830  WBC 12.0*  --   --   --  11.1*  HGB 10.4*  --   --   --  10.9*  HCT 30.0*  --   --   --  32.0*  NA 129* 128* 132* 135  --   K 3.9 3.6  --  4.2  --   CL 98* 98*  --  102  --   CO2 23 24  --  26  --   BUN 16 15  --  13  --   CREATININE 1.30* 1.33*  --  1.30*  --    Lab Results  Component Value Date   ESRSEDRATE >140 (H) 07/12/2017   Lab Results  Component Value Date   CRP 6.6 (H) 07/11/2017    Microbiology: Results for orders placed or performed during the hospital encounter of 07/10/17  Wound or Superficial Culture     Status: None (Preliminary result)   Collection Time: 07/10/17  4:30 PM  Result Value Ref Range Status   Specimen Description FOOT  Final   Special Requests NONE  Final   Gram Stain   Final    RARE WBC SEEN FEW GRAM POSITIVE COCCI Performed at [facility], [place] [address], [place], [place] [address]     Culture MODERATE STAPHYLOCOCCUS AUREUS  Final   Report Status PENDING  Incomplete   Organism ID, Bacteria STAPHYLOCOCCUS AUREUS  Final      Susceptibility   Staphylococcus aureus - MIC*    CIPROFLOXACIN 1 SENSITIVE Sensitive     ERYTHROMYCIN <=0.25 SENSITIVE Sensitive     GENTAMICIN <=0.5 SENSITIVE Sensitive     OXACILLIN 0.5 SENSITIVE Sensitive     TETRACYCLINE <=1 SENSITIVE Sensitive     VANCOMYCIN <=0.5 SENSITIVE Sensitive     TRIMETH/SULFA <=10 SENSITIVE Sensitive     CLINDAMYCIN <=0.25 SENSITIVE Sensitive     RIFAMPIN <=0.5 SENSITIVE Sensitive     Inducible Clindamycin NEGATIVE Sensitive     * MODERATE STAPHYLOCOCCUS AUREUS  Culture, blood (routine x 2)     Status: None (Preliminary result)   Collection Time: 07/10/17  4:30 PM  Result Value Ref Range Status   Specimen Description BLOOD BLOOD LEFT FOREARM  Final   Special Requests   Final    BOTTLES DRAWN AEROBIC AND ANAEROBIC Blood Culture results may not be optimal due to an excessive volume of blood received in culture bottles   Culture NO GROWTH 2 DAYS  Final   Report Status PENDING  Incomplete  Culture, blood (routine x 2)     Status: None (Preliminary result)   Collection Time: 07/10/17  4:30 PM  Result Value Ref Range Status   Specimen Description BLOOD LEFT ANTECUBITAL  Final   Special Requests   Final    BOTTLES DRAWN AEROBIC AND ANAEROBIC Blood Culture results may not be optimal due to an excessive volume of blood received in culture bottles   Culture NO GROWTH 2 DAYS  Final   Report Status PENDING  Incomplete  Surgical pcr screen     Status: Abnormal   Collection Time: 07/10/17  9:05 PM  Result Value Ref Range Status   MRSA, PCR NEGATIVE NEGATIVE Final   Staphylococcus aureus POSITIVE (A) NEGATIVE Final    Comment: (NOTE) The Xpert SA Assay (FDA approved for NASAL specimens in patients 108 years of age and older), is one component of a comprehensive surveillance program. It is not intended to diagnose infection  nor to guide or monitor treatment.     Studies/Results: Dg Chest 2 View  Result Date: 07/11/2017 CLINICAL DATA:  PT was scheduled for foot debridement today and procedure was delayed due to hyponatremia. C/o weakness, h/o afib, CAD, COPD EXAM: CHEST  2 VIEW COMPARISON:  08/03/2013 FINDINGS: Cardiac silhouette is normal in size and configuration. No mediastinal or  hilar masses. No evidence of adenopathy. Mild interstitial thickening in the lung bases. Lungs otherwise clear. No convincing pleural effusion.  No pneumothorax. Skeletal structures are demineralized but grossly intact. IMPRESSION: No acute cardiopulmonary disease. Electronically Signed   By: Lajean Manes M.D.   On: 07/11/2017 19:59   Dg Foot Complete Right  Result Date: 07/10/2017 CLINICAL DATA:  Evaluate for osteomyelitis. EXAM: RIGHT FOOT COMPLETE - 3+ VIEW COMPARISON:  Mar 04, 2006. FINDINGS: Postsurgical changes related to prior first TMT arthrodesis with fractures of several screws, fracture of the screw plate, and backing out of the second most distal screw. No significant osseous fusion across the first TMT joint. Healed fracture deformities of the second through fourth metatarsal necks. No acute fracture. There is focal lucency and cortical disruption along the proximal first metatarsal. The bones are diffusely osteopenic. Atherosclerotic vascular calcifications. IMPRESSION: 1. Postsurgical changes related to prior first TMT arthrodesis with multiple fractured screws, fracturing of the screw plate, and backing out of the second most distal screw. 2. Focal lucency and cortical destruction along the proximal first metatarsal, concerning for osteomyelitis. Electronically Signed   By: Titus Dubin M.D.   On: 07/10/2017 17:17    Assessment/Plan: JARYN ROSKO is a 71 y.o. male with necrotic wound on dorsum of R foot with diminished circulation and exposed tendon and hardware. Xray suggests osteomyelitis. He had  debridement and hardware  removal by Dr Vickki Muff 9/14. Cultures with MSSA. Being seen by vascular. CX MSSA. ESR >140, CRP 6.6 on 9/14.  Recommendations Change to unasyn - will not narrow to just ancef given extensive necrosis and likey anaerobic infection Stop vanco Place PICC - will need 6 weeks IV abx therapy. Discussed with patient Continue wound vac. A Wait vascular recs Thank you very much for the consult. Will follow with you.  Leonel Ramsay   07/12/2017, 11:38 AM

## 2017-07-13 DIAGNOSIS — L97519 Non-pressure chronic ulcer of other part of right foot with unspecified severity: Secondary | ICD-10-CM

## 2017-07-13 DIAGNOSIS — E11621 Type 2 diabetes mellitus with foot ulcer: Secondary | ICD-10-CM

## 2017-07-13 LAB — BASIC METABOLIC PANEL
ANION GAP: 6 (ref 5–15)
BUN: 11 mg/dL (ref 6–20)
CO2: 26 mmol/L (ref 22–32)
Calcium: 7.9 mg/dL — ABNORMAL LOW (ref 8.9–10.3)
Chloride: 100 mmol/L — ABNORMAL LOW (ref 101–111)
Creatinine, Ser: 1.27 mg/dL — ABNORMAL HIGH (ref 0.61–1.24)
GFR calc Af Amer: >60 mL/min (ref >60)
GFR, EST NON AFRICAN AMERICAN: 56 mL/min — AB (ref >60)
GLUCOSE: 123 mg/dL — AB (ref 65–99)
POTASSIUM: 3.8 mmol/L (ref 3.5–5.1)
SODIUM: 132 mmol/L — AB (ref 135–145)

## 2017-07-13 LAB — AEROBIC CULTURE W GRAM STAIN (SUPERFICIAL SPECIMEN)

## 2017-07-13 LAB — CBC
HCT: 28.8 % — ABNORMAL LOW (ref 40.0–52.0)
Hemoglobin: 9.9 g/dL — ABNORMAL LOW (ref 13.0–18.0)
MCH: 30.7 pg (ref 26.0–34.0)
MCHC: 34.5 g/dL (ref 32.0–36.0)
MCV: 88.9 fL (ref 80.0–100.0)
PLATELETS: 205 10*3/uL (ref 150–440)
RBC: 3.24 MIL/uL — AB (ref 4.40–5.90)
RDW: 14.1 % (ref 11.5–14.5)
WBC: 10.7 10*3/uL — ABNORMAL HIGH (ref 3.8–10.6)

## 2017-07-13 LAB — HEPARIN LEVEL (UNFRACTIONATED)
HEPARIN UNFRACTIONATED: 0.43 [IU]/mL (ref 0.30–0.70)
HEPARIN UNFRACTIONATED: 0.41 [IU]/mL (ref 0.30–0.70)

## 2017-07-13 LAB — GLUCOSE, CAPILLARY: GLUCOSE-CAPILLARY: 102 mg/dL — AB (ref 65–99)

## 2017-07-13 LAB — AEROBIC CULTURE  (SUPERFICIAL SPECIMEN)

## 2017-07-13 MED ORDER — AMLODIPINE BESYLATE 5 MG PO TABS
5.0000 mg | ORAL_TABLET | Freq: Every day | ORAL | Status: DC
  Administered 2017-07-13: 5 mg via ORAL

## 2017-07-13 MED ORDER — HYDRALAZINE HCL 20 MG/ML IJ SOLN
20.0000 mg | Freq: Once | INTRAMUSCULAR | Status: AC
  Administered 2017-07-13: 20 mg via INTRAVENOUS

## 2017-07-13 NOTE — Progress Notes (Signed)
Daily Progress Note   Subjective  - 1 Day Post-Op  F/u right foot debridment.  Some pain  Objective Vitals:   07/12/17 1518 07/12/17 2001 07/13/17 0808 07/13/17 0901  BP: (!) 145/62 (!) 122/40 (!) 173/62 (!) 116/45  Pulse: 63 72 73 69  Resp:  19 16   Temp: 97.9 F (36.6 C) 98.2 F (36.8 C) 98.1 F (36.7 C)   TempSrc: Oral Oral Oral   SpO2: 100% 99% 98%   Weight:      Height:        Physical Exam: Wound vac intact.  Mild surrounding erythema at wound vac. Cx with MSSA.   Laboratory CBC    Component Value Date/Time   WBC 10.7 (H) 07/13/2017 0914   HGB 9.9 (L) 07/13/2017 0914   HGB 8.1 (L) 08/04/2013 0333   HCT 28.8 (L) 07/13/2017 0914   HCT 24.9 (L) 08/03/2013 0456   PLT 205 07/13/2017 0914   PLT 142 (L) 08/03/2013 0456    BMET    Component Value Date/Time   NA 132 (L) 07/13/2017 0914   NA 132 (L) 08/04/2013 0333   K 3.8 07/13/2017 0914   K 4.4 08/04/2013 0333   CL 100 (L) 07/13/2017 0914   CL 100 08/04/2013 0333   CO2 26 07/13/2017 0914   CO2 27 08/04/2013 0333   GLUCOSE 123 (H) 07/13/2017 0914   GLUCOSE 69 08/04/2013 0333   BUN 11 07/13/2017 0914   BUN 3 (L) 08/04/2013 0333   CREATININE 1.27 (H) 07/13/2017 0914   CREATININE 0.87 08/04/2013 0333   CALCIUM 7.9 (L) 07/13/2017 0914   CALCIUM 7.6 (L) 08/04/2013 0333   GFRNONAA 56 (L) 07/13/2017 0914   GFRNONAA >60 08/04/2013 0333   GFRAA >60 07/13/2017 0914   GFRAA >60 08/04/2013 2947    Assessment/Planning: Osteomyelitis right foot with infected hardware 1st met-cuneiform.    Debridment with wound vac placed.  Vasc surgery to evaluate with angio Monday.  Will be difficult wound to heal.  If remains infected can consider continued bone debridment/removal.  Mid foot bone removal may not leave very functional foot and may need BKA.  Wound vac changed M/W/F    Samara Deist A  07/13/2017, 10:15 AM

## 2017-07-13 NOTE — Progress Notes (Signed)
ANTICOAGULATION CONSULT NOTE - Follow up   Pharmacy Consult for Heparin  Indication: atrial fibrillation  Allergies  Allergen Reactions  . Keppra [Levetiracetam] Rash    Patient Measurements: Height:  (177.8 cm) Weight: 159 lb 14.4 oz (72.5 kg) IBW/kg (Calculated) : 73 Heparin Dosing Weight: 70.7 kg   Vital Signs: Temp: 98.1 F (36.7 C) (09/15 0808) Temp Source: Oral (09/15 0808) BP: 116/45 (09/15 0901) Pulse Rate: 69 (09/15 0901)  Labs:  Recent Labs  07/10/17 2113 07/11/17 0524  07/11/17 1452 07/12/17 0422 07/12/17 0830 07/12/17 2120 07/13/17 0914  HGB  --  10.4*  --   --   --  10.9*  --  9.9*  HCT  --  30.0*  --   --   --  32.0*  --  28.8*  PLT  --  218  --   --   --  236  --  205  APTT 37*  --   --   --   --   --   --   --   LABPROT 17.8*  --   --   --   --   --   --   --   INR 1.48  --   --   --   --   --   --   --   HEPARINUNFRC  --   --   < > 0.13*  --   --  0.28* 0.43  CREATININE  --  1.30*  --  1.33* 1.30*  --   --  1.27*  < > = values in this interval not displayed.  Estimated Creatinine Clearance: 55.5 mL/min (A) (by C-G formula based on SCr of 1.27 mg/dL (H)).  Assessment: Pharmacy consulted to dose heparin in this 71 year old male with AFib.   Pt was on Xarelto at home, last dose was on 9/11.  Goal of Therapy:  Heparin level 0.3-0.7 units/ml Monitor platelets by anticoagulation protocol: Yes   Plan:  Will order baseline aPTT and INR.  Give 4200 units bolus x 1 Start heparin infusion at 1050 units/hr Check anti-Xa level in 6 hours and daily while on heparin Continue to monitor H&H and platelets  9/13 0729 HL 0.57 Recheck HL in 6 hours and follow up, continue current rate of 1050u/hr  09/13 1700 Pharmacy instructed to reorder heparin drip with stop time of 0400 on 9/14 for surgery. Drip was reordered w/o bolus with appropriate stop time ordered. Nurse made aware of heparin plan.   0914 1230 Pt post-procedure. MD has reordered heparin  drip at 1100 units/hr. Will decrease rate to 1050 units/hr because last therapeutic level was on that rate (see above). No heparin levels obtained while on 1100 units/hr. Will order heparin level 8h after restart of drip CBC in AM. Hgb and plt count stable from yesterday.  Heparin order currently with a stop time, after discussing with hospitalist, will reorder without stop time to run continuously.   9/14:  HL @ 21:00 = 0.28 Will order Heparin 1000 units IV X 1 and increase drip rate to 1200 units/hr.  Will recheck HL 8 hrs after rate change.   9/15: Heparin level resulted @ 0.43. Will recheck Heparin level @ 1730.  Pharmacy will continue to follow.    Demetrius Charity, PharmD Clinical Pharmacist 07/13/2017 10:15 AM

## 2017-07-13 NOTE — Progress Notes (Signed)
ANTICOAGULATION CONSULT NOTE - Follow up   Pharmacy Consult for Heparin  Indication: atrial fibrillation  Allergies  Allergen Reactions  . Keppra [Levetiracetam] Rash    Patient Measurements: Height:  (177.8 cm) Weight: 159 lb 14.4 oz (72.5 kg) IBW/kg (Calculated) : 73 Heparin Dosing Weight: 70.7 kg   Vital Signs: Temp: 98.1 F (36.7 C) (09/15 1611) Temp Source: Oral (09/15 1611) BP: 130/50 (09/15 1742) Pulse Rate: 79 (09/15 1657)  Labs:  Recent Labs  07/10/17 2113  07/11/17 0524  07/11/17 1452 07/12/17 0422 07/12/17 0830 07/12/17 2120 07/13/17 0914 07/13/17 1709  HGB  --   < > 10.4*  --   --   --  10.9*  --  9.9*  --   HCT  --   --  30.0*  --   --   --  32.0*  --  28.8*  --   PLT  --   --  218  --   --   --  236  --  205  --   APTT 37*  --   --   --   --   --   --   --   --   --   LABPROT 17.8*  --   --   --   --   --   --   --   --   --   INR 1.48  --   --   --   --   --   --   --   --   --   HEPARINUNFRC  --   --   --   < > 0.13*  --   --  0.28* 0.43 0.41  CREATININE  --   < > 1.30*  --  1.33* 1.30*  --   --  1.27*  --   < > = values in this interval not displayed.  Estimated Creatinine Clearance: 55.5 mL/min (A) (by C-G formula based on SCr of 1.27 mg/dL (H)).  Assessment: Pharmacy consulted to dose heparin in this 71 year old male with AFib.   Pt was on Xarelto at home, last dose was on 9/11.  Goal of Therapy:  Heparin level 0.3-0.7 units/ml Monitor platelets by anticoagulation protocol: Yes   Plan:  Will order baseline aPTT and INR.  Give 4200 units bolus x 1 Start heparin infusion at 1050 units/hr Check anti-Xa level in 6 hours and daily while on heparin Continue to monitor H&H and platelets  9/13 0729 HL 0.57 Recheck HL in 6 hours and follow up, continue current rate of 1050u/hr  09/13 1700 Pharmacy instructed to reorder heparin drip with stop time of 0400 on 9/14 for surgery. Drip was reordered w/o bolus with appropriate stop time  ordered. Nurse made aware of heparin plan.   0914 1230 Pt post-procedure. MD has reordered heparin drip at 1100 units/hr. Will decrease rate to 1050 units/hr because last therapeutic level was on that rate (see above). No heparin levels obtained while on 1100 units/hr. Will order heparin level 8h after restart of drip CBC in AM. Hgb and plt count stable from yesterday.  Heparin order currently with a stop time, after discussing with hospitalist, will reorder without stop time to run continuously.   9/14:  HL @ 21:00 = 0.28 Will order Heparin 1000 units IV X 1 and increase drip rate to 1200 units/hr.  Will recheck HL 8 hrs after rate change.   9/15: Heparin level resulted @ 0.43. Will recheck  Heparin level @ 1730.  Pharmacy will continue to follow.   9/15 1709  Heparin level resulted .41. Will continue current rate and recheck HL and CBC with AM labs.    Gardner Candle, PharmD,BCPS Clinical Pharmacist 07/13/2017 6:48 PM

## 2017-07-13 NOTE — Progress Notes (Signed)
[facility] [place] [facility]   PATIENT NAME: Stanley Nguyen    MR#:  [id number]  DATE OF BIRTH:  1946/01/19  SUBJECTIVE:   no acute events overnight  REVIEW OF SYSTEMS:    Review of Systems  Constitutional: Negative for fever, chills weight loss HENT: Negative for ear pain, nosebleeds, congestion, facial swelling, rhinorrhea, neck pain, neck stiffness and ear discharge.   Respiratory: Negative for cough, shortness of breath, wheezing  Cardiovascular: Negative for chest pain, palpitations and leg swelling.  Gastrointestinal: Negative for heartburn, abdominal pain, vomiting, diarrhea or consitpation Genitourinary: Negative for dysuria, urgency, frequency, hematuria Musculoskeletal: Negative for back pain or joint pain Neurological: Negative for dizziness, seizures, syncope, focal weakness,  numbness and headaches.  Hematological: Does not bruise/bleed easily.  Psychiatric/Behavioral: Negative for hallucinations, confusion, dysphoric mood    Tolerating Diet: yes      DRUG ALLERGIES:   Allergies  Allergen Reactions  . Keppra [Levetiracetam] Rash    VITALS:  Blood pressure (!) 116/45, pulse 69, temperature 98.1 F (36.7 C), temperature source Oral, resp. rate 16, height  (1.778 m), weight 72.5 kg (159 lb 14.4 oz), SpO2 98 %.  PHYSICAL EXAMINATION:  Constitutional: Appears well-developed and well-nourished. No distress. HENT: Normocephalic. Marland Kitchen Oropharynx is clear and moist.  Eyes: Conjunctivae and EOM are normal. PERRLA, no scleral icterus.  Neck: Normal ROM. Neck supple. No JVD. No tracheal deviation. CVS: RRR, S1/S2 +, no murmurs, no gallops, no carotid bruit.  Pulmonary: Effort and breath sounds normal, no stridor, rhonchi, wheezes, rales.  Abdominal: Soft. BS +,  no distension, tenderness, rebound or guarding.  Musculoskeletal: Normal range of motion. No edema and no tenderness.  Wound VAC placed Neuro: Alert. CN 2-12 grossly intact. No focal  deficits. Skin: Skin is warm and dry. No rash noted. Psychiatric: Normal mood and affect.      LABORATORY PANEL:   CBC  Recent Labs Lab 07/13/17 0914  WBC 10.7*  HGB 9.9*  HCT 28.8*  PLT 205   ------------------------------------------------------------------------------------------------------------------  Chemistries   Recent Labs Lab 07/10/17 1630  07/13/17 0914  NA 131*  < > 132*  K 4.4  < > 3.8  CL 98*  < > 100*  CO2 25  < > 26  GLUCOSE 109*  < > 123*  BUN 12  < > 11  CREATININE 1.04  < > 1.27*  CALCIUM 9.1  < > 7.9*  AST 24  --   --   ALT 14*  --   --   ALKPHOS 86  --   --   BILITOT 1.0  --   --   < > = values in this interval not displayed. ------------------------------------------------------------------------------------------------------------------  Cardiac Enzymes No results for input(s): TROPONINI in the last 168 hours. ------------------------------------------------------------------------------------------------------------------  RADIOLOGY:  Dg Chest 2 View  Result Date: 07/11/2017 CLINICAL DATA:  PT was scheduled for foot debridement today and procedure was delayed due to hyponatremia. C/o weakness, h/o afib, CAD, COPD EXAM: CHEST  2 VIEW COMPARISON:  08/03/2013 FINDINGS: Cardiac silhouette is normal in size and configuration. No mediastinal or hilar masses. No evidence of adenopathy. Mild interstitial thickening in the lung bases. Lungs otherwise clear. No convincing pleural effusion.  No pneumothorax. Skeletal structures are demineralized but grossly intact. IMPRESSION: No acute cardiopulmonary disease. Electronically Signed   By: Amie Portland M.D.   On: 07/11/2017 19:59     ASSESSMENT AND PLAN:   71 year old malewith history of seizure disorder and diabetes who presents with  acute osteomyelitis of right first metatarsal.  1.acute osteomyelitis dorsum of right foot status post debridement and hardware removal postoperative day  #1 Cultures with MSSA. PICC line Continue Unasyn as per ID consultation. He will need 6 weeks of IV antibiotics. Continue wound VAC.change Monday, Wednesday and Friday Plan for angiogram on Monday as per vascular surgery consultation.   2. Hyponatremia: Continue IV fluids and repeat sodium in a.m.  3. Anemia of chronic disease: Hemoglobin remained stable 4. History of chronicatrial fibrillation: Continue heparin drip until after vascular procedure Monday. Continue sotalol  5. COPD without exacerbation: Continue inhalers  6. Tobacco dependence: Patient is encouraged to quit smoking. Counseling was provided for 4 minutes. continue nicotine patch  Management plans discussed with the patient and he is in agreement.  CODE STATUS: full  TOTAL TIME TAKING CARE OF THIS PATIENT: 27 minutes.     POSSIBLE D/C 3-4 days, DEPENDING ON CLINICAL CONDITION.   Bryannah Boston M.D on 07/13/2017 at 11:42 AM  Between 7am to 6pm - Pager - [phone] After 6pm go to [email or web address] - password [employer]  [facility]  Office  [phone]  CC: Primary care physician; [name], MD  Note: This dictation was prepared with Dragon dictation along with smaller phrase technology. Any transcriptional errors that result from this process are unintentional.

## 2017-07-13 NOTE — Progress Notes (Signed)
[facility]  Daily Progress Note   Subjective  - 1 Day Post-Op  No evens overnight  Objective Vitals:   07/12/17 1518 07/12/17 2001 07/13/17 0808 07/13/17 0901  BP: (!) 145/62 (!) 122/40 (!) 173/62 (!) 116/45  Pulse: 63 72 73 69  Resp:  19 16   Temp: 97.9 F (36.6 C) 98.2 F (36.8 C) 98.1 F (36.7 C)   TempSrc: Oral Oral Oral   SpO2: 100% 99% 98%   Weight:      Height:        Intake/Output Summary (Last 24 hours) at 07/13/17 1219 Last data filed at 07/13/17 0953  Gross per 24 hour  Intake              900 ml  Output             1200 ml  Net             -300 ml    PULM  CTAB CV  RRR VASC  Right foot wound dressed, VAC with good seal  Laboratory CBC    Component Value Date/Time   WBC 10.7 (H) 07/13/2017 0914   HGB 9.9 (L) 07/13/2017 0914   HGB 8.1 (L) 08/04/2013 0333   HCT 28.8 (L) 07/13/2017 0914   HCT 24.9 (L) 08/03/2013 0456   PLT 205 07/13/2017 0914   PLT 142 (L) 08/03/2013 0456    BMET    Component Value Date/Time   NA 132 (L) 07/13/2017 0914   NA 132 (L) 08/04/2013 0333   K 3.8 07/13/2017 0914   K 4.4 08/04/2013 0333   CL 100 (L) 07/13/2017 0914   CL 100 08/04/2013 0333   CO2 26 07/13/2017 0914   CO2 27 08/04/2013 0333   GLUCOSE 123 (H) 07/13/2017 0914   GLUCOSE 69 08/04/2013 0333   BUN 11 07/13/2017 0914   BUN 3 (L) 08/04/2013 0333   CREATININE 1.27 (H) 07/13/2017 0914   CREATININE 0.87 08/04/2013 0333   CALCIUM 7.9 (L) 07/13/2017 0914   CALCIUM 7.6 (L) 08/04/2013 0333   GFRNONAA 56 (L) 07/13/2017 0914   GFRNONAA >60 08/04/2013 0333   GFRAA >60 07/13/2017 0914   GFRAA >60 08/04/2013 0333    Assessment/Planning:    Ulceration and infection right foot with suspected PAD  Angiogram Monday  Risks and benefits discussed and he is agreeable to proceed    Festus Barren  07/13/2017, 12:19 PM

## 2017-07-13 NOTE — Progress Notes (Signed)
Pts manual BP 220/80. MD Mody notified. Orders received for one time dose IV Hydralazine 20 mg. Order placed.

## 2017-07-13 NOTE — Progress Notes (Signed)
Pt alert and oriented X4. Pt resting in bed with wife at bedside. Complained of pain in right foot. Scheduled OxyContin given. No other complaints at this time. Will continue to monitor.

## 2017-07-14 DIAGNOSIS — E11621 Type 2 diabetes mellitus with foot ulcer: Secondary | ICD-10-CM

## 2017-07-14 DIAGNOSIS — L97519 Non-pressure chronic ulcer of other part of right foot with unspecified severity: Secondary | ICD-10-CM

## 2017-07-14 LAB — CBC
HCT: 29.2 % — ABNORMAL LOW (ref 40.0–52.0)
HEMOGLOBIN: 10.2 g/dL — AB (ref 13.0–18.0)
MCH: 31.2 pg (ref 26.0–34.0)
MCHC: 34.8 g/dL (ref 32.0–36.0)
MCV: 89.7 fL (ref 80.0–100.0)
PLATELETS: 179 10*3/uL (ref 150–440)
RBC: 3.25 MIL/uL — ABNORMAL LOW (ref 4.40–5.90)
RDW: 14.1 % (ref 11.5–14.5)
WBC: 8.5 10*3/uL (ref 3.8–10.6)

## 2017-07-14 LAB — HEPARIN LEVEL (UNFRACTIONATED)
HEPARIN UNFRACTIONATED: 0.47 [IU]/mL (ref 0.30–0.70)
HEPARIN UNFRACTIONATED: 0.31 [IU]/mL (ref 0.30–0.70)

## 2017-07-14 LAB — BASIC METABOLIC PANEL
Anion gap: 6 (ref 5–15)
BUN: 10 mg/dL (ref 6–20)
CALCIUM: 8 mg/dL — AB (ref 8.9–10.3)
CHLORIDE: 101 mmol/L (ref 101–111)
CO2: 26 mmol/L (ref 22–32)
CREATININE: 0.95 mg/dL (ref 0.61–1.24)
Glucose, Bld: 89 mg/dL (ref 65–99)
Potassium: 4.1 mmol/L (ref 3.5–5.1)
SODIUM: 133 mmol/L — AB (ref 135–145)

## 2017-07-14 LAB — GLUCOSE, CAPILLARY
GLUCOSE-CAPILLARY: 89 mg/dL (ref 65–99)
Glucose-Capillary: 101 mg/dL — ABNORMAL HIGH (ref 65–99)

## 2017-07-14 MED ORDER — DEXTROSE 5 % IV SOLN
1.5000 g | INTRAVENOUS | Status: DC
  Filled 2017-07-14: qty 1.5

## 2017-07-14 MED ORDER — AMLODIPINE BESYLATE 10 MG PO TABS
10.0000 mg | ORAL_TABLET | Freq: Every day | ORAL | Status: DC
  Administered 2017-07-14 – 2017-07-19 (×5): 10 mg via ORAL
  Filled 2017-07-14 (×5): qty 1

## 2017-07-14 MED ORDER — SODIUM CHLORIDE 0.9 % IV SOLN
INTRAVENOUS | Status: DC
  Administered 2017-07-14: 20:00:00 via INTRAVENOUS

## 2017-07-14 NOTE — Progress Notes (Signed)
Upon arriving in pts room, Heparin IV infusion shut off. Belenda Cruise in Pharmacy notified, per [facility] restart IV Heparin, she will adjust lab draws. Heparin restarted.

## 2017-07-14 NOTE — Progress Notes (Signed)
Daily Progress Note   Subjective  - 2 Days Post-Op  F/u right foot debridment.  Wound vac change.  To vascular tomorrow  Objective Vitals:   07/13/17 1958 07/14/17 0822 07/14/17 0854 07/14/17 0926  BP: (!) 152/50 (!) 191/62 (!) 128/39 (!) 147/52  Pulse: 73 71 78 80  Resp: 18 18    Temp: 98.4 F (36.9 C) 98.6 F (37 C)    TempSrc: Oral Oral    SpO2: 96% 97%    Weight:      Height:        Physical Exam: Wound vac changed.  Bone exposed to 1st metatarsal. No purulence today.  Wound measures 2x4 cm depth of 1cm to bone.   Mild/moderate surround erythema.  No foul odor.  Small early necrosis along medial portion of incision. Cx with MSSA  Laboratory CBC    Component Value Date/Time   WBC 8.5 07/14/2017 0551   HGB 10.2 (L) 07/14/2017 0551   HGB 8.1 (L) 08/04/2013 0333   HCT 29.2 (L) 07/14/2017 0551   HCT 24.9 (L) 08/03/2013 0456   PLT 179 07/14/2017 0551   PLT 142 (L) 08/03/2013 0456    BMET    Component Value Date/Time   NA 133 (L) 07/14/2017 0551   NA 132 (L) 08/04/2013 0333   K 4.1 07/14/2017 0551   K 4.4 08/04/2013 0333   CL 101 07/14/2017 0551   CL 100 08/04/2013 0333   CO2 26 07/14/2017 0551   CO2 27 08/04/2013 0333   GLUCOSE 89 07/14/2017 0551   GLUCOSE 69 08/04/2013 0333   BUN 10 07/14/2017 0551   BUN 3 (L) 08/04/2013 0333   CREATININE 0.95 07/14/2017 0551   CREATININE 0.87 08/04/2013 0333   CALCIUM 8.0 (L) 07/14/2017 0551   CALCIUM 7.6 (L) 08/04/2013 0333   GFRNONAA >60 07/14/2017 0551   GFRNONAA >60 08/04/2013 0333   GFRAA >60 07/14/2017 0551   GFRAA >60 08/04/2013 0102    Assessment/Planning: 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  Will have nursing perform t/t/s wound vac changes.  To angio tomorrow.  Will need wound vac for likely 4-6 weeks.  Abx per ID.  Should remain NWB to right foot.  F/u with podiatry in 3 weeks once d/c'd  Will continue to follow while in hospital.  Gwyneth Revels A  07/14/2017, 10:48 AM

## 2017-07-14 NOTE — Progress Notes (Signed)
[facility]  Daily Progress Note   Subjective  - 2 Days Post-Op  No events overnight  Objective Vitals:   07/13/17 1958 07/14/17 0822 07/14/17 0854 07/14/17 0926  BP: (!) 152/50 (!) 191/62 (!) 128/39 (!) 147/52  Pulse: 73 71 78 80  Resp: 18 18    Temp: 98.4 F (36.9 C) 98.6 F (37 C)    TempSrc: Oral Oral    SpO2: 96% 97%    Weight:      Height:        Intake/Output Summary (Last 24 hours) at 07/14/17 1004 Last data filed at 07/14/17 1002  Gross per 24 hour  Intake          1136.95 ml  Output             1250 ml  Net          -113.05 ml    PULM  CTAB CV  RRR VASC  Foot dressed, warm without good pulses  Laboratory CBC    Component Value Date/Time   WBC 8.5 07/14/2017 0551   HGB 10.2 (L) 07/14/2017 0551   HGB 8.1 (L) 08/04/2013 0333   HCT 29.2 (L) 07/14/2017 0551   HCT 24.9 (L) 08/03/2013 0456   PLT 179 07/14/2017 0551   PLT 142 (L) 08/03/2013 0456    BMET    Component Value Date/Time   NA 133 (L) 07/14/2017 0551   NA 132 (L) 08/04/2013 0333   K 4.1 07/14/2017 0551   K 4.4 08/04/2013 0333   CL 101 07/14/2017 0551   CL 100 08/04/2013 0333   CO2 26 07/14/2017 0551   CO2 27 08/04/2013 0333   GLUCOSE 89 07/14/2017 0551   GLUCOSE 69 08/04/2013 0333   BUN 10 07/14/2017 0551   BUN 3 (L) 08/04/2013 0333   CREATININE 0.95 07/14/2017 0551   CREATININE 0.87 08/04/2013 0333   CALCIUM 8.0 (L) 07/14/2017 0551   CALCIUM 7.6 (L) 08/04/2013 0333   GFRNONAA >60 07/14/2017 0551   GFRNONAA >60 08/04/2013 0333   GFRAA >60 07/14/2017 0551   GFRAA >60 08/04/2013 0333    Assessment/Planning:    Right foot infection/ulceration  For angiogram tomorrow  Risks and benefits discussed    Festus Barren  07/14/2017, 10:04 AM

## 2017-07-14 NOTE — Progress Notes (Signed)
ANTICOAGULATION CONSULT NOTE - Follow up   Pharmacy Consult for Heparin  Indication: atrial fibrillation  Allergies  Allergen Reactions  . Keppra [Levetiracetam] Rash    Patient Measurements: Height:  (177.8 cm) Weight: 159 lb 14.4 oz (72.5 kg) IBW/kg (Calculated) : 73 Heparin Dosing Weight: 70.7 kg   Vital Signs: Temp: 98.4 F (36.9 C) (09/15 1958) Temp Source: Oral (09/15 1958) BP: 152/50 (09/15 1958) Pulse Rate: 73 (09/15 1958)  Labs:  Recent Labs  07/12/17 0422  07/12/17 0830  07/13/17 0914 07/13/17 1709 07/14/17 0551  HGB  --   < > 10.9*  --  9.9*  --  10.2*  HCT  --   --  32.0*  --  28.8*  --  29.2*  PLT  --   --  236  --  205  --  179  HEPARINUNFRC  --   --   --   < > 0.43 0.41 0.47  CREATININE 1.30*  --   --   --  1.27*  --  0.95  < > = values in this interval not displayed.  Estimated Creatinine Clearance: 74.2 mL/min (by C-G formula based on SCr of 0.95 mg/dL).  Assessment: Pharmacy consulted to dose heparin in this 71 year old male with AFib.   Pt was on Xarelto at home, last dose was on 9/11.  Goal of Therapy:  Heparin level 0.3-0.7 units/ml Monitor platelets by anticoagulation protocol: Yes   Plan:  Will order baseline aPTT and INR.  Give 4200 units bolus x 1 Start heparin infusion at 1050 units/hr Check anti-Xa level in 6 hours and daily while on heparin Continue to monitor H&H and platelets  9/13 0729 HL 0.57 Recheck HL in 6 hours and follow up, continue current rate of 1050u/hr  09/13 1700 Pharmacy instructed to reorder heparin drip with stop time of 0400 on 9/14 for surgery. Drip was reordered w/o bolus with appropriate stop time ordered. Nurse made aware of heparin plan.   0914 1230 Pt post-procedure. MD has reordered heparin drip at 1100 units/hr. Will decrease rate to 1050 units/hr because last therapeutic level was on that rate (see above). No heparin levels obtained while on 1100 units/hr. Will order heparin level 8h after  restart of drip CBC in AM. Hgb and plt count stable from yesterday.  Heparin order currently with a stop time, after discussing with hospitalist, will reorder without stop time to run continuously.   9/14:  HL @ 21:00 = 0.28 Will order Heparin 1000 units IV X 1 and increase drip rate to 1200 units/hr.  Will recheck HL 8 hrs after rate change.   9/15: Heparin level resulted @ 0.43. Will recheck Heparin level @ 1730.  Pharmacy will continue to follow.   9/15 1709  Heparin level resulted .41. Will continue current rate and recheck HL and CBC with AM labs.   9/16 @ 0600 HL 0.47 therapeutic. Will continue current rate and will recheck HL w/ am labs.   Thomasene Ripple, PharmD,BCPS Clinical Pharmacist 07/14/2017 6:24 AM

## 2017-07-14 NOTE — Progress Notes (Signed)
ANTICOAGULATION CONSULT NOTE - Follow up   Pharmacy Consult for Heparin  Indication: atrial fibrillation  Allergies  Allergen Reactions  . Keppra [Levetiracetam] Rash    Patient Measurements: Height:  (177.8 cm) Weight: 159 lb 14.4 oz (72.5 kg) IBW/kg (Calculated) : 73 Heparin Dosing Weight: 70.7 kg   Vital Signs: Temp: 98.3 F (36.8 C) (09/16 2009) Temp Source: Oral (09/16 2009) BP: 164/57 (09/16 2009) Pulse Rate: 67 (09/16 2009)  Labs:  Recent Labs  07/12/17 0422  07/12/17 0830  07/13/17 0914 07/13/17 1709 07/14/17 0551 07/14/17 2300  HGB  --   < > 10.9*  --  9.9*  --  10.2*  --   HCT  --   --  32.0*  --  28.8*  --  29.2*  --   PLT  --   --  236  --  205  --  179  --   HEPARINUNFRC  --   --   --   < > 0.43 0.41 0.47 0.31  CREATININE 1.30*  --   --   --  1.27*  --  0.95  --   < > = values in this interval not displayed.  Estimated Creatinine Clearance: 74.2 mL/min (by C-G formula based on SCr of 0.95 mg/dL).  Assessment: Pharmacy consulted to dose heparin in this 71 year old male with AFib.   Pt was on Xarelto at home, last dose was on 9/11.  Goal of Therapy:  Heparin level 0.3-0.7 units/ml Monitor platelets by anticoagulation protocol: Yes   Plan:  Will order baseline aPTT and INR.  Give 4200 units bolus x 1 Start heparin infusion at 1050 units/hr Check anti-Xa level in 6 hours and daily while on heparin Continue to monitor H&H and platelets  9/13 0729 HL 0.57 Recheck HL in 6 hours and follow up, continue current rate of 1050u/hr  09/13 1700 Pharmacy instructed to reorder heparin drip with stop time of 0400 on 9/14 for surgery. Drip was reordered w/o bolus with appropriate stop time ordered. Nurse made aware of heparin plan.   0914 1230 Pt post-procedure. MD has reordered heparin drip at 1100 units/hr. Will decrease rate to 1050 units/hr because last therapeutic level was on that rate (see above). No heparin levels obtained while on 1100 units/hr.  Will order heparin level 8h after restart of drip CBC in AM. Hgb and plt count stable from yesterday.  Heparin order currently with a stop time, after discussing with hospitalist, will reorder without stop time to run continuously.   9/14:  HL @ 21:00 = 0.28 Will order Heparin 1000 units IV X 1 and increase drip rate to 1200 units/hr.  Will recheck HL 8 hrs after rate change.   9/15: Heparin level resulted @ 0.43. Will recheck Heparin level @ 1730.  Pharmacy will continue to follow.   9/15 1709  Heparin level resulted .41. Will continue current rate and recheck HL and CBC with AM labs.   9/16 @ 0600 HL 0.47 therapeutic. Will continue current rate and will recheck HL w/ am labs.  9/16 1500- 9/160 RN found Heparin drip turned off.  Will reschedule Heparin level for 8 hours after restart (2300).  9/16 @ 2300 HL 0.31 therapeutic. Will continue current rate consider drip was turned off and will recheck HL w/ am labs.  Thomasene Ripple, PharmD,BCPS Clinical Pharmacist 07/14/2017 11:43 PM

## 2017-07-14 NOTE — Progress Notes (Signed)
Pts BP 191/62. MD paged. Giving Norvasc, Betapace, and IV hydralazine per MD.

## 2017-07-14 NOTE — Progress Notes (Signed)
[employer] [facility] [place] at [facility]   PATIENT NAME: Stanley Nguyen    MR#:  [id number]  DATE OF BIRTH:  [birth year]  SUBJECTIVE:  BP was elevated Yesterday afternoon. Hydralazine IV was given. Blood pressures improved. Patient denies headache. Patient reports that he may have had an increased amount of anxiety at that time.  REVIEW OF SYSTEMS:    Review of Systems  Constitutional: Negative for fever, chills weight loss HENT: Negative for ear pain, nosebleeds, congestion, facial swelling, rhinorrhea, neck pain, neck stiffness and ear discharge.   Respiratory: Negative for cough, shortness of breath, wheezing  Cardiovascular: Negative for chest pain, palpitations and leg swelling.  Gastrointestinal: Negative for heartburn, abdominal pain, vomiting, diarrhea or consitpation Genitourinary: Negative for dysuria, urgency, frequency, hematuria Musculoskeletal: Negative for back pain or joint pain Neurological: Negative for dizziness, seizures, syncope, focal weakness,  numbness and headaches.  Hematological: Does not bruise/bleed easily.  Psychiatric/Behavioral: Negative for hallucinations, confusion, dysphoric mood    Tolerating Diet: yes      DRUG ALLERGIES:   Allergies  Allergen Reactions  . Keppra [Levetiracetam] Rash    VITALS:  Blood pressure (!) 152/50, pulse 73, temperature 98.4 F (36.9 C), temperature source Oral, resp. rate 18, height  (1.778 m), weight 72.5 kg (159 lb 14.4 oz), SpO2 96 %.  PHYSICAL EXAMINATION:  Constitutional: Appears well-developed and well-nourished. No distress. HENT: Normocephalic. Marland Kitchen Oropharynx is clear and moist.  Eyes: Conjunctivae and EOM are normal. PERRLA, no scleral icterus.  Neck: Normal ROM. Neck supple. No JVD. No tracheal deviation. CVS: RRR, S1/S2 +, no murmurs, no gallops, no carotid bruit.  Pulmonary: Effort and breath sounds normal, no stridor, rhonchi, wheezes, rales.  Abdominal: Soft. BS +,  no distension,  tenderness, rebound or guarding.  Musculoskeletal: Normal range of motion. No edema and no tenderness.  Wound VAC placed Neuro: Alert. CN 2-12 grossly intact. No focal deficits. Skin: Skin is warm and dry. No rash noted. Psychiatric: Normal mood and affect.      LABORATORY PANEL:   CBC  Recent Labs Lab 07/14/17 0551  WBC 8.5  HGB 10.2*  HCT 29.2*  PLT 179   ------------------------------------------------------------------------------------------------------------------  Chemistries   Recent Labs Lab 07/10/17 1630  07/14/17 0551  NA 131*  < > 133*  K 4.4  < > 4.1  CL 98*  < > 101  CO2 25  < > 26  GLUCOSE 109*  < > 89  BUN 12  < > 10  CREATININE 1.04  < > 0.95  CALCIUM 9.1  < > 8.0*  AST 24  --   --   ALT 14*  --   --   ALKPHOS 86  --   --   BILITOT 1.0  --   --   < > = values in this interval not displayed. ------------------------------------------------------------------------------------------------------------------  Cardiac Enzymes No results for input(s): TROPONINI in the last 168 hours. ------------------------------------------------------------------------------------------------------------------  RADIOLOGY:  No results found.   ASSESSMENT AND PLAN:   71 year old malewith history of seizure disorder and diabetes who presents with acute osteomyelitis of right first metatarsal.  1.acute osteomyelitis dorsum of right foot status post debridement and hardware removal postoperative day #2 Cultures with MSSA. PICC linePlaced Continue Unasyn as per ID consultation. He will need 6 weeks of IV antibiotics. Continue wound VAC to be changea Monday, Wednesday and Friday as per podiatry. Plan for angiogram on Monday as per vascular surgery consultation to evaluate for PAD.   2. Hyponatremia: Sodium levels  stable  3. Anemia of chronic disease: Hemoglobin remains stable 4. History of chronicatrial fibrillation: Continue heparin drip until after vascular  procedure Monday then may change back to oral medications. Continue sotalol  5. COPD without exacerbation: Continue inhalers  6. Tobacco dependence: Patient is encouraged to quit smoking. Counseling was provided for 4 minutes. continue nicotine patch  7. Elevated blood pressure without history of hypertension: Start Norvasc Blood pressure improved   Management plans discussed with the patient and he is in agreement.  CODE STATUS: full  TOTAL TIME TAKING CARE OF THIS PATIENT: 24 minutes.    D/w dr dew yesterday POSSIBLE D/C 2-3 days, DEPENDING ON CLINICAL CONDITION.   Elleana Stillson M.D on 07/14/2017 at 7:59 AM  Between 7am to 6pm - Pager - [phone] After 6pm go to [email or web address] - password [email or web address]  [facility]  Office  [phone]  CC: Primary care physician; [name], MD  Note: This dictation was prepared with Dragon dictation along with smaller phrase technology. Any transcriptional errors that result from this process are unintentional.

## 2017-07-14 NOTE — Progress Notes (Signed)
ANTICOAGULATION CONSULT NOTE - Follow up   Pharmacy Consult for Heparin  Indication: atrial fibrillation  Allergies  Allergen Reactions  . Keppra [Levetiracetam] Rash    Patient Measurements: Height:  (177.8 cm) Weight: 159 lb 14.4 oz (72.5 kg) IBW/kg (Calculated) : 73 Heparin Dosing Weight: 70.7 kg   Vital Signs: Temp: 98.6 F (37 C) (09/16 0822) Temp Source: Oral (09/16 0822) BP: 147/52 (09/16 0926) Pulse Rate: 80 (09/16 0926)  Labs:  Recent Labs  07/12/17 0422  07/12/17 0830  07/13/17 0914 07/13/17 1709 07/14/17 0551  HGB  --   < > 10.9*  --  9.9*  --  10.2*  HCT  --   --  32.0*  --  28.8*  --  29.2*  PLT  --   --  236  --  205  --  179  HEPARINUNFRC  --   --   --   < > 0.43 0.41 0.47  CREATININE 1.30*  --   --   --  1.27*  --  0.95  < > = values in this interval not displayed.  Estimated Creatinine Clearance: 74.2 mL/min (by C-G formula based on SCr of 0.95 mg/dL).  Assessment: Pharmacy consulted to dose heparin in this 71 year old male with AFib.   Pt was on Xarelto at home, last dose was on 9/11.  Goal of Therapy:  Heparin level 0.3-0.7 units/ml Monitor platelets by anticoagulation protocol: Yes   Plan:  Will order baseline aPTT and INR.  Give 4200 units bolus x 1 Start heparin infusion at 1050 units/hr Check anti-Xa level in 6 hours and daily while on heparin Continue to monitor H&H and platelets  9/13 0729 HL 0.57 Recheck HL in 6 hours and follow up, continue current rate of 1050u/hr  09/13 1700 Pharmacy instructed to reorder heparin drip with stop time of 0400 on 9/14 for surgery. Drip was reordered w/o bolus with appropriate stop time ordered. Nurse made aware of heparin plan.   0914 1230 Pt post-procedure. MD has reordered heparin drip at 1100 units/hr. Will decrease rate to 1050 units/hr because last therapeutic level was on that rate (see above). No heparin levels obtained while on 1100 units/hr. Will order heparin level 8h after restart  of drip CBC in AM. Hgb and plt count stable from yesterday.  Heparin order currently with a stop time, after discussing with hospitalist, will reorder without stop time to run continuously.   9/14:  HL @ 21:00 = 0.28 Will order Heparin 1000 units IV X 1 and increase drip rate to 1200 units/hr.  Will recheck HL 8 hrs after rate change.   9/15: Heparin level resulted @ 0.43. Will recheck Heparin level @ 1730.  Pharmacy will continue to follow.   9/15 1709  Heparin level resulted .41. Will continue current rate and recheck HL and CBC with AM labs.   9/16 @ 0600 HL 0.47 therapeutic. Will continue current rate and will recheck HL w/ am labs.  9/16 1500- 9/160 RN found Heparin drip turned off.  Will reschedule Heparin level for 8 hours after restart (2300).   Brylee Berk A, PharmD,BCPS Clinical Pharmacist 07/14/2017 3:07 PM

## 2017-07-15 ENCOUNTER — Encounter
Admission: EM | Disposition: A | Discharge status: Skilled Nursing Facility | Payer: Self-pay | Source: Home / Self Care | Attending: Internal Medicine

## 2017-07-15 DIAGNOSIS — I70213 Atherosclerosis of native arteries of extremities with intermittent claudication, bilateral legs: Secondary | ICD-10-CM

## 2017-07-15 HISTORY — PX: LOWER EXTREMITY ANGIOGRAPHY: CATH118251

## 2017-07-15 LAB — PROTEIN ELECTROPHORESIS, SERUM
A/G Ratio: 0.5 — ABNORMAL LOW (ref 0.7–1.7)
ALBUMIN ELP: 2.2 g/dL — AB (ref 2.9–4.4)
ALPHA-1-GLOBULIN: 0.2 g/dL (ref 0.0–0.4)
ALPHA-2-GLOBULIN: 0.8 g/dL (ref 0.4–1.0)
Beta Globulin: 1.2 g/dL (ref 0.7–1.3)
GAMMA GLOBULIN: 2.4 g/dL — AB (ref 0.4–1.8)
Globulin, Total: 4.6 g/dL — ABNORMAL HIGH (ref 2.2–3.9)
Total Protein ELP: 6.8 g/dL (ref 6.0–8.5)

## 2017-07-15 LAB — BASIC METABOLIC PANEL
Anion gap: 4 — ABNORMAL LOW (ref 5–15)
BUN: 11 mg/dL (ref 6–20)
CALCIUM: 8 mg/dL — AB (ref 8.9–10.3)
CO2: 27 mmol/L (ref 22–32)
CREATININE: 0.99 mg/dL (ref 0.61–1.24)
Chloride: 99 mmol/L — ABNORMAL LOW (ref 101–111)
GFR calc non Af Amer: >60 mL/min (ref >60)
Glucose, Bld: 86 mg/dL (ref 65–99)
Potassium: 3.9 mmol/L (ref 3.5–5.1)
SODIUM: 130 mmol/L — AB (ref 135–145)

## 2017-07-15 LAB — CULTURE, BLOOD (ROUTINE X 2)
Culture: NO GROWTH
Culture: NO GROWTH

## 2017-07-15 LAB — CBC
HCT: 28.3 % — ABNORMAL LOW (ref 40.0–52.0)
Hemoglobin: 9.9 g/dL — ABNORMAL LOW (ref 13.0–18.0)
MCH: 31.2 pg (ref 26.0–34.0)
MCHC: 35 g/dL (ref 32.0–36.0)
MCV: 89.3 fL (ref 80.0–100.0)
PLATELETS: 170 10*3/uL (ref 150–440)
RBC: 3.17 MIL/uL — AB (ref 4.40–5.90)
RDW: 14.3 % (ref 11.5–14.5)
WBC: 7.6 10*3/uL (ref 3.8–10.6)

## 2017-07-15 LAB — GLUCOSE, CAPILLARY: Glucose-Capillary: 76 mg/dL (ref 65–99)

## 2017-07-15 LAB — HEPARIN LEVEL (UNFRACTIONATED): HEPARIN UNFRACTIONATED: 0.4 [IU]/mL (ref 0.30–0.70)

## 2017-07-15 LAB — KAPPA/LAMBDA LIGHT CHAINS
Kappa free light chain: 150.4 mg/L — ABNORMAL HIGH (ref 3.3–19.4)
Kappa, lambda light chain ratio: 0.99 (ref 0.26–1.65)
Lambda free light chains: 151.8 mg/L — ABNORMAL HIGH (ref 5.7–26.3)

## 2017-07-15 SURGERY — LOWER EXTREMITY ANGIOGRAPHY
Anesthesia: Moderate Sedation | Laterality: Right

## 2017-07-15 MED ORDER — HEPARIN SODIUM (PORCINE) 1000 UNIT/ML IJ SOLN
INTRAMUSCULAR | Status: DC | PRN
  Administered 2017-07-15 (×2): 2500 [IU] via INTRAVENOUS

## 2017-07-15 MED ORDER — MIDAZOLAM HCL 5 MG/5ML IJ SOLN
INTRAMUSCULAR | Status: AC
  Filled 2017-07-15: qty 10

## 2017-07-15 MED ORDER — HEPARIN SODIUM (PORCINE) 1000 UNIT/ML IJ SOLN
INTRAMUSCULAR | Status: AC
  Filled 2017-07-15: qty 1

## 2017-07-15 MED ORDER — FENTANYL CITRATE (PF) 100 MCG/2ML IJ SOLN
INTRAMUSCULAR | Status: AC
  Filled 2017-07-15: qty 4

## 2017-07-15 MED ORDER — FENTANYL CITRATE (PF) 100 MCG/2ML IJ SOLN
INTRAMUSCULAR | Status: DC | PRN
  Administered 2017-07-15 (×2): 50 ug via INTRAVENOUS
  Administered 2017-07-15: 25 ug via INTRAVENOUS
  Administered 2017-07-15: 50 ug via INTRAVENOUS
  Administered 2017-07-15: 25 ug via INTRAVENOUS

## 2017-07-15 MED ORDER — IOPAMIDOL (ISOVUE-300) INJECTION 61%
INTRAVENOUS | Status: DC | PRN
  Administered 2017-07-15: 80 mL via INTRA_ARTERIAL

## 2017-07-15 MED ORDER — LISINOPRIL 10 MG PO TABS
10.0000 mg | ORAL_TABLET | Freq: Every day | ORAL | Status: DC
  Administered 2017-07-15 – 2017-07-19 (×4): 10 mg via ORAL
  Filled 2017-07-15: qty 2
  Filled 2017-07-15 (×3): qty 1

## 2017-07-15 MED ORDER — MIDAZOLAM HCL 2 MG/2ML IJ SOLN
INTRAMUSCULAR | Status: DC | PRN
  Administered 2017-07-15: 1 mg via INTRAVENOUS
  Administered 2017-07-15: 2 mg via INTRAVENOUS
  Administered 2017-07-15 (×3): 1 mg via INTRAVENOUS

## 2017-07-15 SURGICAL SUPPLY — 27 items
BALLN LUTONIX AV 6X60X75 (BALLOONS) ×3
BALLN LUTONIX DCB 6X80X130 (BALLOONS) ×3
BALLN ULTRVRSE 5X100X75 (BALLOONS) ×3
BALLN ULTRVRSE 6X150X130C (BALLOONS) ×3
BALLOON LUTONIX AV 6X60X75 (BALLOONS) ×1 IMPLANT
BALLOON LUTONIX DCB 6X80X130 (BALLOONS) ×1 IMPLANT
BALLOON ULTRVRSE 5X100X75 (BALLOONS) ×1 IMPLANT
BALLOON ULTRVRSE 6X150X130C (BALLOONS) ×1 IMPLANT
CATH BEACON 5 .035 40 KMP TP (CATHETERS) ×1 IMPLANT
CATH BEACON 5 .035 65 KMP TIP (CATHETERS) ×3 IMPLANT
CATH BEACON 5 .038 40 KMP TP (CATHETERS) ×2
CATH PIG 70CM (CATHETERS) ×3 IMPLANT
COVER PROBE U/S 5X48 (MISCELLANEOUS) ×3 IMPLANT
DEVICE PRESTO INFLATION (MISCELLANEOUS) ×3 IMPLANT
DEVICE STARCLOSE SE CLOSURE (Vascular Products) ×3 IMPLANT
GLIDEWIRE ANGLED SS 035X260CM (WIRE) ×3 IMPLANT
GLIDEWIRE STIFF .35X180X3 HYDR (WIRE) ×3 IMPLANT
PACK ANGIOGRAPHY (CUSTOM PROCEDURE TRAY) ×3 IMPLANT
SHEATH BRITE TIP 5FRX11 (SHEATH) ×3 IMPLANT
SHEATH BRITE TIP 6FR X 23 (SHEATH) ×3 IMPLANT
SHIELD RADPAD SCOOP 12X17 (MISCELLANEOUS) ×3 IMPLANT
STENT LIFESTREAM 7X26X135 (Permanent Stent) ×6 IMPLANT
STENT VIABAHN 6X150X120 (Permanent Stent) ×3 IMPLANT
TUBING CONTRAST HIGH PRESS 72 (TUBING) ×3 IMPLANT
WIRE G 018X200 V18 (WIRE) ×3 IMPLANT
WIRE J 3MM .035X145CM (WIRE) ×3 IMPLANT
WIRE MAGIC TORQUE 260C (WIRE) ×6 IMPLANT

## 2017-07-15 NOTE — H&P (Signed)
[facility] History & Physical Update  The patient was interviewed and re-examined.  The patient's previous History and Physical has been reviewed and is unchanged.  There is no change in the plan of care. We plan to proceed with the scheduled procedure.  Festus Barren, MD  07/15/2017, 8:52 AM

## 2017-07-15 NOTE — Progress Notes (Signed)
Pharmacy Antibiotic Note  Stanley Nguyen is a 71 y.o. male admitted on 07/10/2017 with  sepsis secondary to wound infection and possible osteomyelitis.  Pharmacy has been consulted for vancomycin and Zosyn dosing. Patient received Vancomycin 1g IV and Zosyn 3.375 IV x 1 dose in ED.  Vancomycin trough came back supratherapeutic (34 on vancomycin 1 g IV q12h) on 9/14 but antibiotics narrowed to Unasyn.  Plan: Continue Unasyn 3 g IV q6h  Height:  (177.8 cm) Weight: 177 lb 6.4 oz (80.5 kg) IBW/kg (Calculated) : 73  Temp (24hrs), Avg:98.3 F (36.8 C), Min:98.1 F (36.7 C), Max:98.4 F (36.9 C)   Recent Labs Lab 07/11/17 0524 07/11/17 1452 07/12/17 0422 07/12/17 0830 07/12/17 1234 07/13/17 0914 07/14/17 0551 07/15/17 0515  WBC 12.0*  --   --  11.1*  --  10.7* 8.5 7.6  CREATININE 1.30* 1.33* 1.30*  --   --  1.27* 0.95 0.99  VANCOTROUGH  --   --   --   --  34*  --   --   --     Estimated Creatinine Clearance: 71.7 mL/min (by C-G formula based on SCr of 0.99 mg/dL).    Allergies  Allergen Reactions  . Keppra [Levetiracetam] Rash    Antimicrobials this admission: 9/12 vancomycin  >> 9/14 9/12 Zosyn  >> 9/14 Unasyn 9/14>>  Dose adjustments this admission:  Microbiology results: 9/12 BCx: NG final 9/12 Wound Cx: MSSA 9/14 WCX surgical - staph aureus pending finalization  Thank you for allowing pharmacy to be a part of this patient's care.  Marty Heck, PharmD, BCPS Clinical Pharmacist 07/15/2017 10:12 AM

## 2017-07-15 NOTE — Progress Notes (Addendum)
ANTICOAGULATION CONSULT NOTE - Follow up   Pharmacy Consult for Heparin  Indication: atrial fibrillation  Allergies  Allergen Reactions  . Keppra [Levetiracetam] Rash    Patient Measurements: Height:  (177.8 cm) Weight: 177 lb 6.4 oz (80.5 kg) IBW/kg (Calculated) : 73 Heparin Dosing Weight: 70.7 kg   Vital Signs: Temp: 96.6 F (35.9 C) (09/17 1215) Temp Source: Axillary (09/17 1215) BP: 151/58 (09/17 1215) Pulse Rate: 63 (09/17 1215)  Labs:  Recent Labs  07/13/17 0914  07/14/17 0551 07/14/17 2300 07/15/17 0515  HGB 9.9*  --  10.2*  --  9.9*  HCT 28.8*  --  29.2*  --  28.3*  PLT 205  --  179  --  170  HEPARINUNFRC 0.43  < > 0.47 0.31 0.40  CREATININE 1.27*  --  0.95  --  0.99  < > = values in this interval not displayed.  Estimated Creatinine Clearance: 71.7 mL/min (by C-G formula based on SCr of 0.99 mg/dL).  Assessment: Pharmacy consulted to dose heparin in this 71 year old male with AFib.   Pt was on Xarelto at home, last dose was on 9/11.  Goal of Therapy:  Heparin level 0.3-0.7 units/ml Monitor platelets by anticoagulation protocol: Yes   Plan:  Will order baseline aPTT and INR.  Give 4200 units bolus x 1 Start heparin infusion at 1050 units/hr Check anti-Xa level in 6 hours and daily while on heparin Continue to monitor H&H and platelets  9/13 0729 HL 0.57 Recheck HL in 6 hours and follow up, continue current rate of 1050u/hr  09/13 1700 Pharmacy instructed to reorder heparin drip with stop time of 0400 on 9/14 for surgery. Drip was reordered w/o bolus with appropriate stop time ordered. Nurse made aware of heparin plan.   0914 1230 Pt post-procedure. MD has reordered heparin drip at 1100 units/hr. Will decrease rate to 1050 units/hr because last therapeutic level was on that rate (see above). No heparin levels obtained while on 1100 units/hr. Will order heparin level 8h after restart of drip CBC in AM. Hgb and plt count stable from  yesterday.  Heparin order currently with a stop time, after discussing with hospitalist, will reorder without stop time to run continuously.   9/14:  HL @ 21:00 = 0.28 Will order Heparin 1000 units IV X 1 and increase drip rate to 1200 units/hr.  Will recheck HL 8 hrs after rate change.   9/15: Heparin level resulted @ 0.43. Will recheck Heparin level @ 1730.  Pharmacy will continue to follow.   9/15 1709  Heparin level resulted .41. Will continue current rate and recheck HL and CBC with AM labs.   9/16 @ 0600 HL 0.47 therapeutic. Will continue current rate and will recheck HL w/ am labs.  9/16 1500- 9/160 RN found Heparin drip turned off.  Will reschedule Heparin level for 8 hours after restart (2300).  9/16 @ 2300 HL 0.31 therapeutic. Will continue current rate consider drip was turned off and will recheck HL w/ am labs.  9/17 @ 0500 HL 0.40 therapeutic. Will continue current rate and will recheck w/ am labs.  8295 heparin stopped for procedure and restarted at 1108 by RN. Will retime heparin level for 1900 to check 8h level. CBC in AM.   Marty Heck, PharmD,BCPS Clinical Pharmacist 07/15/2017 2:51 PM    [id number] 0030 heparin level 0.44. Continue current regimen. Recheck in 6 hours to confirm.  0865 AM heparin level 0.37. Continue current regimen. Recheck  heparin level and CBC with tomorrow AM labs.  Fulton Reek, PharmD, BCPS  07/16/17 6:58 AM

## 2017-07-15 NOTE — Progress Notes (Signed)
ANTICOAGULATION CONSULT NOTE - Follow up   Pharmacy Consult for Heparin  Indication: atrial fibrillation  Allergies  Allergen Reactions  . Keppra [Levetiracetam] Rash    Patient Measurements: Height:  (177.8 cm) Weight: 177 lb 6.4 oz (80.5 kg) IBW/kg (Calculated) : 73 Heparin Dosing Weight: 70.7 kg   Vital Signs: Temp: 98.3 F (36.8 C) (09/16 2009) Temp Source: Oral (09/16 2009) BP: 164/57 (09/16 2009) Pulse Rate: 67 (09/16 2009)  Labs:  Recent Labs  07/13/17 0914  07/14/17 0551 07/14/17 2300 07/15/17 0515  HGB 9.9*  --  10.2*  --  9.9*  HCT 28.8*  --  29.2*  --  28.3*  PLT 205  --  179  --  170  HEPARINUNFRC 0.43  < > 0.47 0.31 0.40  CREATININE 1.27*  --  0.95  --  0.99  < > = values in this interval not displayed.  Estimated Creatinine Clearance: 71.7 mL/min (by C-G formula based on SCr of 0.99 mg/dL).  Assessment: Pharmacy consulted to dose heparin in this 71 year old male with AFib.   Pt was on Xarelto at home, last dose was on 9/11.  Goal of Therapy:  Heparin level 0.3-0.7 units/ml Monitor platelets by anticoagulation protocol: Yes   Plan:  Will order baseline aPTT and INR.  Give 4200 units bolus x 1 Start heparin infusion at 1050 units/hr Check anti-Xa level in 6 hours and daily while on heparin Continue to monitor H&H and platelets  9/13 0729 HL 0.57 Recheck HL in 6 hours and follow up, continue current rate of 1050u/hr  09/13 1700 Pharmacy instructed to reorder heparin drip with stop time of 0400 on 9/14 for surgery. Drip was reordered w/o bolus with appropriate stop time ordered. Nurse made aware of heparin plan.   0914 1230 Pt post-procedure. MD has reordered heparin drip at 1100 units/hr. Will decrease rate to 1050 units/hr because last therapeutic level was on that rate (see above). No heparin levels obtained while on 1100 units/hr. Will order heparin level 8h after restart of drip CBC in AM. Hgb and plt count stable from  yesterday.  Heparin order currently with a stop time, after discussing with hospitalist, will reorder without stop time to run continuously.   9/14:  HL @ 21:00 = 0.28 Will order Heparin 1000 units IV X 1 and increase drip rate to 1200 units/hr.  Will recheck HL 8 hrs after rate change.   9/15: Heparin level resulted @ 0.43. Will recheck Heparin level @ 1730.  Pharmacy will continue to follow.   9/15 1709  Heparin level resulted .41. Will continue current rate and recheck HL and CBC with AM labs.   9/16 @ 0600 HL 0.47 therapeutic. Will continue current rate and will recheck HL w/ am labs.  9/16 1500- 9/160 RN found Heparin drip turned off.  Will reschedule Heparin level for 8 hours after restart (2300).  9/16 @ 2300 HL 0.31 therapeutic. Will continue current rate consider drip was turned off and will recheck HL w/ am labs.  9/17 @ 0500 HL 0.40 therapeutic. Will continue current rate and will recheck w/ am labs.  Thomasene Ripple, PharmD,BCPS Clinical Pharmacist 07/15/2017 5:48 AM

## 2017-07-15 NOTE — Op Note (Signed)
[facility] Percutaneous Study/Intervention Procedural Note   Date of Surgery: 07/15/2017  Surgeon(s):Abbott Jasinski   Assistants:none  Pre-operative Diagnosis: PAD with ulceration and infection right lower extremity  Post-operative diagnosis: Same  Procedure(s) Performed: 1. Ultrasound guidance for vascular access left femoral artery 2. Catheter placement into right profunda femoris artery from left femoral approach 3. Aortogram and selective right lower extremity angiogram 4. Percutaneous transluminal angioplasty of left external iliac artery with 6 mm diameter by 6 cm length Lutonix drug-coated angioplasty balloon inflated twice  5. Percutaneous transluminal angioplasty of the left common iliac artery with 6 mm diameter by 6 cm length angioplasty balloon  6.  Percutaneous transluminal angioplasty of the right external iliac artery with 5 mm diameter by 10 cm length angioplasty balloon 7. Percutaneous transluminal angioplasty of the right common iliac artery with 6 mm diameter by 6 cm length angioplasty balloon   8.  Viabahn stent placement to the right external iliac artery up to the distal common iliac artery with 6 mm diameter by 15 cm length stent   9.  Lifestream stent placement to the right common iliac artery with 7 mm diameter by 26 cm length covered stent  10. Lifestream stent placement to the left common iliac artery with 7 mm diameter by 26 cm length covered stent    EBL: 10 cc  Contrast: 85 cc  Fluoro Time: 12.3 minutes  Moderate Conscious Sedation Time: approximately 75 minutes using 5 mg of Versed and 200 mcg of Fentanyl  Indications: Patient is a 71 y.o.male with poorly palpable pedal pulses and a nonhealing ulceration with infection of the right foot. The patient is brought in for angiography for further evaluation and potential treatment. Risks and benefits  are discussed and informed consent is obtained  Procedure: The patient was identified and appropriate procedural time out was performed. The patient was then placed supine on the table and prepped and draped in the usual sterile fashion.Moderate conscious sedation was administered during a face to face encounter with the patient throughout the procedure with my supervision of the RN administering medicines and monitoring the patient's vital signs, pulse oximetry, telemetry and mental status throughout from the start of the procedure until the patient was taken to the recovery room. Ultrasound was used to evaluate the left common femoral artery. It was heavily diseased. A digital ultrasound image was acquired. A Seldinger needle was used to access the left common femoral artery under direct ultrasound guidance and a permanent image was performed. A 0.035 J wire was advanced without resistance and a 5Fr sheath was placed. Pigtail catheter was placed into the aorta and an AP aortogram was performed. This demonstrated severe, diffuse aortoiliac disease was seen with greater than 70% stenosis the right common iliac artery, a previously placed stent in the right external iliac artery with near total occlusion at it's distal endpoint at about 60% stenosis at the proximal endpoint. There was then a very diseased and possibly occluded common femoral artery. The left common iliac artery had an occlusion and a previously placed stent in the left external iliac artery. The left common iliac artery had a very calcific stenosis in the 80% range. The aorta was diffusely calcific but not stenotic. The renal arteries appeared patent although opacification was not great. I then crossed the aortic bifurcation with some difficulty and advanced to the right femoral head. Selective right lower extremity angiogram was then performed. This demonstrated extensively diseased common femoral artery with occlusion of the profunda  femoris artery and apparent occlusion of the common femoral artery and nearly occlusive stenosis of the origin of superficial femoral artery. Superficial femoral artery then normalized for about 10-15 cm before there was a short segment high-grade stenosis in the mid to distal SFA with normalization of the vessel at Hunter's canal. There was then two-vessel runoff into the foot. The patient was systemically heparinized and a 6 French 21 cm sheath was then placed over the Terumo Advantage wire. I then used a Kumpe catheter and the advantage wire to it into the common femoral artery and actually into the profunda femoris artery and then [place] a Magic torque wire in this location for purchase. The common femoral lesion and mid to distal SFA lesion with have to be addressed with a femoral endarterectomy and an either a femoral to popliteal bypass access in an antegrade fashion. the left external iliac artery was treated with 2 inflations with a 6 mm diameter by 6 cm length Lutonix drug-coated angioplasty balloon. Inflations were 8-10 atm for 1 minute. The left common iliac artery was then treated with an additional inflation with a 6 mm diameter by 6 cm length angioplasty balloon. 6 was taken to 12 atm for 1 minute. The external iliac artery had less than 30% residual stenosis but the vessel was occluded at the top of the femoral head. I then turned my attention to the right iliac system. The right external iliac artery was treated with a 5 mm diameter by 10 cm length angioplasty balloon taken to the top of the femoral head. The right hypogastric artery was already occluded. The right common iliac artery was treated with a 6 mm diameter by 6 cm length angioplasty balloon inflated to 12 atm for 1 minute. there were still multiple areas of greater than 50% residual stenosis throughout the common and external iliac arteries including the proximal and distal endpoints of the previously placed right external iliac stent. I  exchanged for a 0.018 wire and deployed a 6 mm diameter by 15 cm length Viabahn stent from the top of the femoral head up to the distal right common iliac artery. I then used a 7 mm diameter by 26 cm length Lifestream stent in the right common iliac artery. Following stents in both the right common and external iliac artery there was less than 20% residual stenosis in the right iliac system and again the right common femoral treated with open surgical therapy. There was still a greater than 50% residual stenosis and a highly calcified lesion in the left common iliac artery, and I then treated this with a 7 mm diameter by 26 cm length Lifestream stent as well. This was inflated to 12 atm and less than 20% residual stenosis seen in the left common iliac artery. With occlusion of the left common femoral artery, sheath was removed and manual pressure was held for 15 minutes and a dressing was placed. The patient was taken to the recovery room in stable condition having tolerated the procedure well.  Findings:  Aortogram: Severe, diffuse aortoiliac disease was seen with greater than 70% stenosis the right common iliac artery, a previously placed stent in the right external iliac artery with near total occlusion at it's distal endpoint at about 60% stenosis at the proximal endpoint. There was then a very diseased and possibly occluded common femoral artery. The left common iliac artery had an occlusion and a previously placed stent in the left external iliac artery. The left common iliac artery had a  very calcific stenosis in the 80% range. The aorta was diffusely calcific but not stenotic. The renal arteries appeared patent although opacification was not great. Right Lower Extremity: This demonstrated extensively diseased common femoral artery with occlusion of the profunda femoris artery and apparent occlusion of the common femoral artery and nearly occlusive stenosis of the origin of  superficial femoral artery. Superficial femoral artery then normalized for about 10-15 cm before there was a short segment high-grade stenosis in the mid to distal SFA with normalization of the vessel at Hunter's canal. There was then two-vessel runoff into the foot   Disposition: Patient was taken to the recovery room in stable condition having tolerated the procedure well.  Complications: None  Leotis Pain 07/15/2017 10:34 AM   This note was created with Dragon Medical transcription system. Any errors in dictation are purely unintentional.

## 2017-07-15 NOTE — Progress Notes (Signed)
[place] [facility] PROGRESS NOTE Date of Admission:  07/10/2017     ID: Stanley Nguyen is a 71 y.o. 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 Had excision of infected extensor tendon  ROS  Eleven systems are reviewed and negative except per hpi  Medications:  Antibiotics Given (last 72 hours)    Date/Time Action Medication Dose Rate   07/12/17 1438 New Bag/Given   Ampicillin-Sulbactam (UNASYN) 3 g in sodium chloride 0.9 % 100 mL IVPB 3 g 200 mL/hr   07/12/17 1922 New Bag/Given   Ampicillin-Sulbactam (UNASYN) 3 g in sodium chloride 0.9 % 100 mL IVPB 3 g 200 mL/hr   07/13/17 0111 New Bag/Given   Ampicillin-Sulbactam (UNASYN) 3 g in sodium chloride 0.9 % 100 mL IVPB 3 g 200 mL/hr   07/13/17 0857 New Bag/Given   Ampicillin-Sulbactam (UNASYN) 3 g in sodium chloride 0.9 % 100 mL IVPB 3 g 200 mL/hr   07/13/17 1423 New Bag/Given   Ampicillin-Sulbactam (UNASYN) 3 g in sodium chloride 0.9 % 100 mL IVPB 3 g 200 mL/hr   07/13/17 1936 New Bag/Given   Ampicillin-Sulbactam (UNASYN) 3 g in sodium chloride 0.9 % 100 mL IVPB 3 g 200 mL/hr   07/14/17 0246 New Bag/Given   Ampicillin-Sulbactam (UNASYN) 3 g in sodium chloride 0.9 % 100 mL IVPB 3 g 200 mL/hr   07/14/17 0849 New Bag/Given   Ampicillin-Sulbactam (UNASYN) 3 g in sodium chloride 0.9 % 100 mL IVPB 3 g 200 mL/hr   07/14/17 1447 New Bag/Given   Ampicillin-Sulbactam (UNASYN) 3 g in sodium chloride 0.9 % 100 mL IVPB 3 g 200 mL/hr   07/14/17 1930 New Bag/Given   Ampicillin-Sulbactam (UNASYN) 3 g in sodium chloride 0.9 % 100 mL IVPB 3 g 200 mL/hr   07/15/17 0206 New Bag/Given   Ampicillin-Sulbactam (UNASYN) 3 g in sodium chloride 0.9 % 100 mL IVPB 3 g 200 mL/hr   07/15/17 0804 New Bag/Given   Ampicillin-Sulbactam (UNASYN) 3 g in sodium chloride 0.9 % 100 mL IVPB 3 g 200 mL/hr   07/15/17  1318 New Bag/Given   Ampicillin-Sulbactam (UNASYN) 3 g in sodium chloride 0.9 % 100 mL IVPB 3 g 200 mL/hr     . amLODipine  10 mg Oral Daily  . busPIRone  7.5 mg Oral BID  . Chlorhexidine Gluconate Cloth  6 each Topical Daily  . gabapentin  300 mg Oral TID  . lacosamide  50 mg Oral BID  . lisinopril  10 mg Oral Daily  . mupirocin ointment  1 application Nasal BID  . nicotine  21 mg Transdermal Daily  . oxyCODONE  30 mg Oral Q8H  . polyethylene glycol  17 g Oral Daily  . sodium chloride flush  10-40 mL Intracatheter Q12H  . sotalol  80 mg Oral Daily  . tiotropium  1 capsule Inhalation Daily  . zolpidem  5 mg Oral QHS    Objective: Vital signs in last 24 hours: Temp:  [96.6 F (35.9 C)-98.4 F (36.9 C)] 96.6 F (35.9 C) (09/17 1215) Pulse Rate:  [59-72] 63 (09/17 1215) Resp:  [10-18] 16 (09/17 1215) BP: (117-203)/(51-75) 151/58 (09/17 1215) SpO2:  [96 %-100 %] 97 % (09/17 1215) Weight:  [80.5 kg (177 lb 6.4 oz)] 80.5 kg (177 lb 6.4 oz) (09/17 0500) Constitutional: He is oriented to person, place, and time.thin,  frail  HENT: anicteric  Mouth/Throat: Oropharynx is clear and moist. No oropharyngeal exudate.  Cardiovascular: Normal rate, regular rhythm and normal heart sounds. Pulmonary/Chest: Effort normal and breath sounds normal. No respiratory distress. He has no wheezes.  Abdominal: Soft. Bowel sounds are normal. He exhibits no distension. There is no tenderness.  Lymphadenopathy: He has no cervical adenopathy.  Neurological: He is alert and oriented to person, place, and time.  Skin: Derm:The dorsal aspect of his right foot has a wound vac in place Psychiatric: He has a normal mood and affect. His behavior is normal.   Lab Results  Recent Labs  07/14/17 0551 07/15/17 0515  WBC 8.5 7.6  HGB 10.2* 9.9*  HCT 29.2* 28.3*  NA 133* 130*  K 4.1 3.9  CL 101 99*  CO2 26 27  BUN 10 11  CREATININE 0.95 0.99   Lab Results  Component Value Date   ESRSEDRATE >140 (H)  07/12/2017   Lab Results  Component Value Date   CRP 6.6 (H) 07/11/2017    Microbiology: Results for orders placed or performed during the hospital encounter of 07/10/17  Wound or Superficial Culture     Status: None   Collection Time: 07/10/17  4:30 PM  Result Value Ref Range Status   Specimen Description FOOT  Final   Special Requests NONE  Final   Gram Stain   Final    RARE WBC SEEN FEW GRAM POSITIVE COCCI Performed at [facility], [place] [address], [place], [place] [address]    Culture MODERATE STAPHYLOCOCCUS AUREUS  Final   Report Status 07/13/2017 FINAL  Final   Organism ID, Bacteria STAPHYLOCOCCUS AUREUS  Final      Susceptibility   Staphylococcus aureus - MIC*    CIPROFLOXACIN 1 SENSITIVE Sensitive     ERYTHROMYCIN <=0.25 SENSITIVE Sensitive     GENTAMICIN <=0.5 SENSITIVE Sensitive     OXACILLIN 0.5 SENSITIVE Sensitive     TETRACYCLINE <=1 SENSITIVE Sensitive     VANCOMYCIN <=0.5 SENSITIVE Sensitive     TRIMETH/SULFA <=10 SENSITIVE Sensitive     CLINDAMYCIN <=0.25 SENSITIVE Sensitive     RIFAMPIN <=0.5 SENSITIVE Sensitive     Inducible Clindamycin NEGATIVE Sensitive     * MODERATE STAPHYLOCOCCUS AUREUS  Culture, blood (routine x 2)     Status: None   Collection Time: 07/10/17  4:30 PM  Result Value Ref Range Status   Specimen Description BLOOD BLOOD LEFT FOREARM  Final   Special Requests   Final    BOTTLES DRAWN AEROBIC AND ANAEROBIC Blood Culture results may not be optimal due to an excessive volume of blood received in culture bottles   Culture NO GROWTH 5 DAYS  Final   Report Status 07/15/2017 FINAL  Final  Culture, blood (routine x 2)     Status: None   Collection Time: 07/10/17  4:30 PM  Result Value Ref Range Status   Specimen Description BLOOD LEFT ANTECUBITAL  Final   Special Requests   Final    BOTTLES DRAWN AEROBIC AND ANAEROBIC Blood Culture results may not be optimal due to an excessive volume of blood received in culture bottles   Culture NO  GROWTH 5 DAYS  Final   Report Status 07/15/2017 FINAL  Final  Surgical pcr screen     Status: Abnormal   Collection Time: 07/10/17  9:05 PM  Result Value Ref Range Status   MRSA, PCR NEGATIVE NEGATIVE Final   Staphylococcus aureus POSITIVE (A) NEGATIVE Final  Comment: (NOTE) The Xpert SA Assay (FDA approved for NASAL specimens in patients 50 years of age and older), is one component of a comprehensive surveillance program. It is not intended to diagnose infection nor to guide or monitor treatment.   Aerobic/Anaerobic Culture (surgical/deep wound)     Status: None (Preliminary result)   Collection Time: 07/12/17  9:57 AM  Result Value Ref Range Status   Specimen Description FOOT RIGHT first metatarsal SWAB  Final   Special Requests NONE  Final   Gram Stain   Final    FEW WBC PRESENT, PREDOMINANTLY PMN FEW GRAM POSITIVE COCCI IN CLUSTERS Performed at [facility], [place] [address], [place],  [address]    Culture   Final    FEW STAPHYLOCOCCUS AUREUS NO ANAEROBES ISOLATED; CULTURE IN PROGRESS FOR 5 DAYS    Report Status PENDING  Incomplete    Studies/Results: No results found.  Assessment/Plan: AUGUSTINE LEVERETTE is a 71 y.o. male with necrotic wound on dorsum of R foot with diminished circulation and exposed tendon and hardware. Xray suggests osteomyelitis. He had  debridement and hardware removal by Dr Vickki Muff 9/14. Cultures with MSSA. ESR >140, CRP 6.6 on 9/14. Revascularization 9/17.  Will need femoral endartectomy this week  Recommendations Continue  unasyn - will not narrow to just ancef given extensive necrosis and likey anaerobic infection Place PICC - will need 6 weeks IV abx therapy. Discussed with patient Thank you very much for the consult. Will follow with you.  Leonel Ramsay   07/15/2017, 2:20 PM

## 2017-07-15 NOTE — Progress Notes (Signed)
[facility]   PATIENT NAME: Stanley Nguyen    MR#:  [id number]  DATE OF BIRTH:  February 20, 1946  SUBJECTIVE:  Patient seen in Specials this am  He underwent angiogram. stent placement to the right common iliac artery and left common iliac artery   REVIEW OF SYSTEMS:    Review of Systems  Constitutional: Negative for fever, chills weight loss HENT: Negative for ear pain, nosebleeds, congestion, facial swelling, rhinorrhea, neck pain, neck stiffness and ear discharge.   Respiratory: Negative for cough, shortness of breath, wheezing  Cardiovascular: Negative for chest pain, palpitations and leg swelling.  Gastrointestinal: Negative for heartburn, abdominal pain, vomiting, diarrhea or consitpation Genitourinary: Negative for dysuria, urgency, frequency, hematuria Musculoskeletal: Negative for back pain or joint pain Neurological: Negative for dizziness, seizures, syncope, focal weakness,  numbness and headaches.  Hematological: Does not bruise/bleed easily.  Psychiatric/Behavioral: Negative for hallucinations, confusion, dysphoric mood    Tolerating Diet: yes      DRUG ALLERGIES:   Allergies  Allergen Reactions  . Keppra [Levetiracetam] Rash    VITALS:  Blood pressure (!) 203/69, pulse 72, temperature 98.4 F (36.9 C), temperature source Oral, resp. rate 12, height  (1.778 m), weight 80.5 kg (177 lb 6.4 oz), SpO2 100 %.  PHYSICAL EXAMINATION:  Constitutional: Appears well-developed and well-nourished. No distress. HENT: Normocephalic. Marland Kitchen Oropharynx is clear and moist.  Eyes: Conjunctivae and EOM are normal. PERRLA, no scleral icterus.  Neck: Normal ROM. Neck supple. No JVD. No tracheal deviation. CVS: RRR, S1/S2 +, no murmurs, no gallops, no carotid bruit.  Pulmonary: Effort and breath sounds normal, no stridor, rhonchi, wheezes, rales.  Abdominal: Soft. BS +,  no distension, tenderness, rebound or guarding.  Musculoskeletal: Normal  range of motion. No edema and no tenderness.  Wound VAC placed Neuro: Alert. CN 2-12 grossly intact. No focal deficits. Skin: Skin is warm and dry. No rash noted. Psychiatric: Normal mood and affect.      LABORATORY PANEL:   CBC  Recent Labs Lab 07/15/17 0515  WBC 7.6  HGB 9.9*  HCT 28.3*  PLT 170   ------------------------------------------------------------------------------------------------------------------  Chemistries   Recent Labs Lab 07/10/17 1630  07/15/17 0515  NA 131*  < > 130*  K 4.4  < > 3.9  CL 98*  < > 99*  CO2 25  < > 27  GLUCOSE 109*  < > 86  BUN 12  < > 11  CREATININE 1.04  < > 0.99  CALCIUM 9.1  < > 8.0*  AST 24  --   --   ALT 14*  --   --   ALKPHOS 86  --   --   BILITOT 1.0  --   --   < > = values in this interval not displayed. ------------------------------------------------------------------------------------------------------------------  Cardiac Enzymes No results for input(s): TROPONINI in the last 168 hours. ------------------------------------------------------------------------------------------------------------------  RADIOLOGY:  No results found.   ASSESSMENT AND PLAN:   71 year old male with history of seizure disorder and diabetes who presents with acute osteomyelitis of right first metatarsal.  1.acute osteomyelitis dorsum of right foot status post debridement and hardware removal postoperative day #3 with PAD Cultures with MSSA. PICC line  Placed Continue Unasyn as per ID consultation. He will need 6 weeks of IV antibiotics. Continue wound VAC to be change  Tuesday, Thursday and Saturday  Patient underwent angiogram today and had 2 stents placed in right and left common iliac artery Will need femoral endarterectomy planned for  Wednesday.  2. Hyponatremia: Sodium levels stable Sodium 130  3. Anemia of chronic disease: Hemoglobin remains stable 4. History of chronic atrial fibrillation:  Can change back to oral  anticoagulation after femoral procedure Wednesday Continue sotalol  5. COPD without exacerbation: Continue inhalers  6. Tobacco dependence: Patient is encouraged to quit smoking. continue nicotine patch  7. Elevated blood pressure without history of hypertension: continue Norvasc ADD lisinopril  Monitor BP    Management plans discussed with the patient and he is in agreement.  CODE STATUS: full  TOTAL TIME TAKING CARE OF THIS PATIENT: 24 minutes.     POSSIBLE D/C 1-2 days, DEPENDING ON CLINICAL CONDITION.   Xaden Kaufman M.D on 07/15/2017 at 9:21 AM  Between 7am to 6pm - Pager - [phone] After 6pm go to [email or web address] - password [email or web address]  [facility]  Office  [phone]  CC: Primary care physician; [name], MD  Note: This dictation was prepared with Dragon dictation along with smaller phrase technology. Any transcriptional errors that result from this process are unintentional.

## 2017-07-16 ENCOUNTER — Encounter: Payer: Self-pay | Admitting: Vascular Surgery

## 2017-07-16 DIAGNOSIS — I70213 Atherosclerosis of native arteries of extremities with intermittent claudication, bilateral legs: Secondary | ICD-10-CM

## 2017-07-16 LAB — CBC
HCT: 27.5 % — ABNORMAL LOW (ref 40.0–52.0)
HEMOGLOBIN: 9.7 g/dL — AB (ref 13.0–18.0)
MCH: 31.3 pg (ref 26.0–34.0)
MCHC: 35.1 g/dL (ref 32.0–36.0)
MCV: 89.1 fL (ref 80.0–100.0)
Platelets: 166 10*3/uL (ref 150–440)
RBC: 3.09 MIL/uL — ABNORMAL LOW (ref 4.40–5.90)
RDW: 14.3 % (ref 11.5–14.5)
WBC: 8.8 10*3/uL (ref 3.8–10.6)

## 2017-07-16 LAB — GLUCOSE, CAPILLARY: Glucose-Capillary: 94 mg/dL (ref 65–99)

## 2017-07-16 LAB — BASIC METABOLIC PANEL
ANION GAP: 4 — AB (ref 5–15)
BUN: 13 mg/dL (ref 6–20)
CHLORIDE: 98 mmol/L — AB (ref 101–111)
CO2: 28 mmol/L (ref 22–32)
Calcium: 7.8 mg/dL — ABNORMAL LOW (ref 8.9–10.3)
Creatinine, Ser: 1.01 mg/dL (ref 0.61–1.24)
GFR calc Af Amer: >60 mL/min (ref >60)
GLUCOSE: 94 mg/dL (ref 65–99)
POTASSIUM: 3.9 mmol/L (ref 3.5–5.1)
Sodium: 130 mmol/L — ABNORMAL LOW (ref 135–145)

## 2017-07-16 LAB — HEPARIN LEVEL (UNFRACTIONATED)
HEPARIN UNFRACTIONATED: 0.44 [IU]/mL (ref 0.30–0.70)
Heparin Unfractionated: 0.37 IU/mL (ref 0.30–0.70)

## 2017-07-16 LAB — SURGICAL PATHOLOGY

## 2017-07-16 MED ORDER — SODIUM CHLORIDE 0.9 % IV SOLN
INTRAVENOUS | Status: DC
  Administered 2017-07-16 – 2017-07-17 (×2): via INTRAVENOUS

## 2017-07-16 MED ORDER — SENNOSIDES-DOCUSATE SODIUM 8.6-50 MG PO TABS
1.0000 | ORAL_TABLET | Freq: Two times a day (BID) | ORAL | Status: DC
  Administered 2017-07-16 – 2017-07-19 (×6): 1 via ORAL
  Filled 2017-07-16 (×6): qty 1

## 2017-07-16 MED ORDER — BISACODYL 5 MG PO TBEC
5.0000 mg | DELAYED_RELEASE_TABLET | Freq: Once | ORAL | Status: AC
  Administered 2017-07-16: 5 mg via ORAL
  Filled 2017-07-16: qty 1

## 2017-07-16 MED ORDER — CHLORHEXIDINE GLUCONATE CLOTH 2 % EX PADS
6.0000 | MEDICATED_PAD | Freq: Once | CUTANEOUS | Status: AC
  Administered 2017-07-16: 6 via TOPICAL

## 2017-07-16 NOTE — Progress Notes (Signed)
Chaplain rounding the unit visit with pt. Pt was asleep at the time of this visit. CH offered silent prayer for pt and left. CH to make a follow up with pt as needed.   07/16/17 1600  Clinical Encounter Type  Visited With Patient  Visit Type Initial;Spiritual support  Referral From Chaplain  Consult/Referral To Chaplain  Spiritual Encounters  Spiritual Needs Prayer;Other (Comment)

## 2017-07-16 NOTE — Progress Notes (Signed)
[employer] - [place] at [facility]   PATIENT NAME: Stanley Nguyen    MR#:  [id number]  DATE OF BIRTH:  1946-05-29  SUBJECTIVE:  Patient Without any issues this morning. He is eating breakfast. REVIEW OF SYSTEMS:    Review of Systems  Constitutional: Negative for fever, chills weight loss HENT: Negative for ear pain, nosebleeds, congestion, facial swelling, rhinorrhea, neck pain, neck stiffness and ear discharge.   Respiratory: Negative for cough, shortness of breath, wheezing  Cardiovascular: Negative for chest pain, palpitations and leg swelling.  Gastrointestinal: Negative for heartburn, abdominal pain, vomiting, diarrhea or consitpation Genitourinary: Negative for dysuria, urgency, frequency, hematuria Musculoskeletal: Negative for back pain or joint pain Neurological: Negative for dizziness, seizures, syncope, focal weakness,  numbness and headaches.  Hematological: Does not bruise/bleed easily.  Psychiatric/Behavioral: Negative for hallucinations, confusion, dysphoric mood    Tolerating Diet: yes      DRUG ALLERGIES:   Allergies  Allergen Reactions  . Keppra [Levetiracetam] Rash    VITALS:  Blood pressure (!) 121/49, pulse 63, temperature 98.3 F (36.8 C), temperature source Oral, resp. rate 19, height  (1.778 m), weight 76.3 kg (168 lb 4.8 oz), SpO2 99 %.  PHYSICAL EXAMINATION:  Constitutional: Appears well-developed and well-nourished. No distress. HENT: Normocephalic. Marland Kitchen Oropharynx is clear and moist.  Eyes: Conjunctivae and EOM are normal. PERRLA, no scleral icterus.  Neck: Normal ROM. Neck supple. No JVD. No tracheal deviation. CVS: RRR, S1/S2 +, no murmurs, no gallops, no carotid bruit.  Pulmonary: Effort and breath sounds normal, no stridor, rhonchi, wheezes, rales.  Abdominal: Soft. BS +,  no distension, tenderness, rebound or guarding.  Musculoskeletal: Normal range of motion. No edema and no tenderness.  Wound VAC placed Neuro:  Alert. CN 2-12 grossly intact. No focal deficits. Skin: Skin is warm and dry. No rash noted. Psychiatric: Normal mood and affect.      LABORATORY PANEL:   CBC  Recent Labs Lab 07/16/17 0600  WBC 8.8  HGB 9.7*  HCT 27.5*  PLT 166   ------------------------------------------------------------------------------------------------------------------  Chemistries   Recent Labs Lab 07/10/17 1630  07/16/17 0600  NA 131*  < > 130*  K 4.4  < > 3.9  CL 98*  < > 98*  CO2 25  < > 28  GLUCOSE 109*  < > 94  BUN 12  < > 13  CREATININE 1.04  < > 1.01  CALCIUM 9.1  < > 7.8*  AST 24  --   --   ALT 14*  --   --   ALKPHOS 86  --   --   BILITOT 1.0  --   --   < > = values in this interval not displayed. ------------------------------------------------------------------------------------------------------------------  Cardiac Enzymes No results for input(s): TROPONINI in the last 168 hours. ------------------------------------------------------------------------------------------------------------------  RADIOLOGY:  No results found.   ASSESSMENT AND PLAN:   71 year old male with history of seizure disorder and diabetes who presents with acute osteomyelitis of right first metatarsal.  1.acute osteomyelitis dorsum of right foot status post debridement and hardware removal postoperative day #4 with PAD Cultures with MSSA. PICC line placed Continue Unasyn as per ID consultation. He will need 6 weeks of IV antibiotics. Continue wound VAC to be change  Tuesday, Thursday and Saturday  Patient underwent angiogram 9/16 and had 2 stents placed in right and left common iliac artery Will need femoral endarterectomy planned for Wednesday.  2. Hyponatremia: Sodium levels stable Sodium 130This morning.  3. Anemia of chronic disease:  Hemoglobin remains stable 4. History of chronic atrial fibrillation:  Can change back to oral anticoagulation after femoral procedure Wednesday Continue  sotalol  5. COPD without exacerbation: Continue inhalers  6. Tobacco dependence: Patient is encouraged to quit smoking. continue nicotine patch  7. Elevated blood pressure without history of hypertension: continue Norvasc And  lisinopril  Monitor BP/creatinine   D/w dr schnier Management plans discussed with the patient and he is in agreement.  CODE STATUS: full  TOTAL TIME TAKING CARE OF THIS PATIENT: 24 minutes.     POSSIBLE D/C 4-5 days, DEPENDING ON CLINICAL CONDITION.   Stanley Nguyen M.D on 07/16/2017 at 8:25 AM  Between 7am to 6pm - Pager - [phone] After 6pm go to [email or web address] - password [email or web address]  [facility] [place]  Office  [phone]  CC: Primary care physician; [name], MD  Note: This dictation was prepared with Dragon dictation along with smaller phrase technology. Any transcriptional errors that result from this process are unintentional.

## 2017-07-16 NOTE — Progress Notes (Signed)
[facility]  Daily Progress Note   Subjective  - 1 Day Post-Op  No events overnight.  No pain.  Access site without issues. Tolerating diet VAC has a good seal  Objective Vitals:   07/15/17 1500 07/15/17 2111 07/16/17 0344 07/16/17 0943  BP: (!) 148/58 (!) 121/49  (!) 122/52  Pulse: 64 63    Resp:  19    Temp:  98.3 F (36.8 C)    TempSrc:  Oral    SpO2:  99%    Weight:   76.3 kg (168 lb 4.8 oz)   Height:        Intake/Output Summary (Last 24 hours) at 07/16/17 0957 Last data filed at 07/16/17 1610  Gross per 24 hour  Intake           1597.4 ml  Output             1000 ml  Net            597.4 ml    PULM  CTAB CV  RRR VASC  Right foot without pedal pulses but warm, access site C/D/I  Laboratory CBC    Component Value Date/Time   WBC 8.8 07/16/2017 0600   HGB 9.7 (L) 07/16/2017 0600   HGB 8.1 (L) 08/04/2013 0333   HCT 27.5 (L) 07/16/2017 0600   HCT 24.9 (L) 08/03/2013 0456   PLT 166 07/16/2017 0600   PLT 142 (L) 08/03/2013 0456    BMET    Component Value Date/Time   NA 130 (L) 07/16/2017 0600   NA 132 (L) 08/04/2013 0333   K 3.9 07/16/2017 0600   K 4.4 08/04/2013 0333   CL 98 (L) 07/16/2017 0600   CL 100 08/04/2013 0333   CO2 28 07/16/2017 0600   CO2 27 08/04/2013 0333   GLUCOSE 94 07/16/2017 0600   GLUCOSE 69 08/04/2013 0333   BUN 13 07/16/2017 0600   BUN 3 (L) 08/04/2013 0333   CREATININE 1.01 07/16/2017 0600   CREATININE 0.87 08/04/2013 0333   CALCIUM 7.8 (L) 07/16/2017 0600   CALCIUM 7.6 (L) 08/04/2013 0333   GFRNONAA >60 07/16/2017 0600   GFRNONAA >60 08/04/2013 0333   GFRAA >60 07/16/2017 0600   GFRAA >60 08/04/2013 0333    Assessment/Planning: POD #1 s/p iliac stents and right leg angiogram   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  His inflow has been addressed with iliac intervention yesterday and he tolerated this well.  Also  has a focal SFA lesion that we could probably treat with a hybrid procedure with angioplasty and stenting during the endarterectomy tomorrow.  Risks and benefits were discussed with he and his wife and the patient is on the operating room schedule for tomorrow    Stanley Nguyen  07/16/2017, 9:57 AM

## 2017-07-16 NOTE — Care Management Note (Addendum)
Case Management Note  Patient Details  Name: Stanley Nguyen MRN: [id number] Date of Birth: [birth year]  Subjective/Objective:  POD #4 -acute osteomyelitis of right foot s/p debridement and hardware removal. POD # 1 of left angiogram with endarterectomy planned 9/19. Spoke with wife, prior to admission patient was using a walker and wheelchair due to foot ulcer. He had no home health. Wife is patients care giver. No home O2. Wife states she will not be able to care for patient at discharge with PICC line and wound vac. She is on O2 herself. Wife is very concerned about "how they are going to make it. I absolutely can not take care of him at home" Discussed PT and SNF. She has no choice of SNF's.                   Action/Plan: Endarterectomy 9/19,It is anticipated patient will go to ICU following this procedure for at least 24 hours. Wife aware. CSW updated   Expected Discharge Date:                  Expected Discharge Plan:     In-House Referral:     Discharge planning Services  CM Consult  Post Acute Care Choice:    Choice offered to:     DME Arranged:    DME Agency:     HH Arranged:    HH Agency:     Status of Service:  In process, will continue to follow  If discussed at Long Length of Stay Meetings, dates discussed:    Additional Comments:  Marily Memos, RN 07/16/2017, 10:36 AM

## 2017-07-16 NOTE — Progress Notes (Signed)
Infectious Disease Long Term IV Antibiotic Orders  Diagnosis: Foot wound and osteomyeltiis, s/p hardware removal, PAD  Culture results Results for orders placed or performed during the hospital encounter of 07/10/17  Wound or Superficial Culture     Status: None   Collection Time: 07/10/17  4:30 PM  Result Value Ref Range Status   Specimen Description FOOT  Final   Special Requests NONE  Final   Gram Stain   Final    RARE WBC SEEN FEW GRAM POSITIVE COCCI Performed at [facility], [place] [address], [place], [place] [address]    Culture MODERATE STAPHYLOCOCCUS AUREUS  Final   Report Status 07/13/2017 FINAL  Final   Organism ID, Bacteria STAPHYLOCOCCUS AUREUS  Final      Susceptibility   Staphylococcus aureus - MIC*    CIPROFLOXACIN 1 SENSITIVE Sensitive     ERYTHROMYCIN <=0.25 SENSITIVE Sensitive     GENTAMICIN <=0.5 SENSITIVE Sensitive     OXACILLIN 0.5 SENSITIVE Sensitive     TETRACYCLINE <=1 SENSITIVE Sensitive     VANCOMYCIN <=0.5 SENSITIVE Sensitive     TRIMETH/SULFA <=10 SENSITIVE Sensitive     CLINDAMYCIN <=0.25 SENSITIVE Sensitive     RIFAMPIN <=0.5 SENSITIVE Sensitive     Inducible Clindamycin NEGATIVE Sensitive     * MODERATE STAPHYLOCOCCUS AUREUS  Culture, blood (routine x 2)     Status: None   Collection Time: 07/10/17  4:30 PM  Result Value Ref Range Status   Specimen Description BLOOD BLOOD LEFT FOREARM  Final   Special Requests   Final    BOTTLES DRAWN AEROBIC AND ANAEROBIC Blood Culture results may not be optimal due to an excessive volume of blood received in culture bottles   Culture NO GROWTH 5 DAYS  Final   Report Status 07/15/2017 FINAL  Final  Culture, blood (routine x 2)     Status: None   Collection Time: 07/10/17  4:30 PM  Result Value Ref Range Status   Specimen Description BLOOD LEFT ANTECUBITAL  Final   Special Requests   Final    BOTTLES DRAWN AEROBIC AND ANAEROBIC Blood Culture results may not be optimal due to an excessive volume of blood  received in culture bottles   Culture NO GROWTH 5 DAYS  Final   Report Status 07/15/2017 FINAL  Final  Surgical pcr screen     Status: Abnormal   Collection Time: 07/10/17  9:05 PM  Result Value Ref Range Status   MRSA, PCR NEGATIVE NEGATIVE Final   Staphylococcus aureus POSITIVE (A) NEGATIVE Final    Comment: (NOTE) The Xpert SA Assay (FDA approved for NASAL specimens in patients 101 years of age and older), is one component of a comprehensive surveillance program. It is not intended to diagnose infection nor to guide or monitor treatment.   Aerobic/Anaerobic Culture (surgical/deep wound)     Status: None   Collection Time: 07/12/17  9:57 AM  Result Value Ref Range Status   Specimen Description FOOT RIGHT first metatarsal SWAB  Final   Special Requests NONE  Final   Gram Stain   Final    FEW WBC PRESENT, PREDOMINANTLY PMN FEW GRAM POSITIVE COCCI IN CLUSTERS Performed at [facility], [place] [address] [place], [place] [address]    Culture   Final    FEW STAPHYLOCOCCUS AUREUS FEW STREPTOCOCCUS ANGINOSIS    Report Status 07/16/2017 FINAL  Final   Organism ID, Bacteria STREPTOCOCCUS ANGINOSIS  Final      Susceptibility  Streptococcus anginosis - MIC*    PENICILLIN <=0.06 SENSITIVE Sensitive     CEFTRIAXONE 0.25 SENSITIVE Sensitive     ERYTHROMYCIN <=0.12 SENSITIVE Sensitive     LEVOFLOXACIN 0.5 SENSITIVE Sensitive     VANCOMYCIN 1 SENSITIVE Sensitive     * FEW STREPTOCOCCUS ANGINOSIS     Allergies:  Allergies  Allergen Reactions  . Keppra [Levetiracetam] Rash   Lab Results  Component Value Date   CRP 6.6 (H) 07/11/2017  .l Lab Results  Component Value Date   ESRSEDRATE >140 (H) 07/12/2017   Lab Results  Component Value Date   CREATININE 1.01 07/16/2017   BUN 13 07/16/2017   NA 130 (L) 07/16/2017   K 3.9 07/16/2017   CL 98 (L) 07/16/2017   CO2 28 07/16/2017   Lab Results  Component Value Date   WBC 8.8 07/16/2017   HGB 9.7 (L) 07/16/2017   HCT 27.5  (L) 07/16/2017   MCV 89.1 07/16/2017   PLT 166 07/16/2017    Discharge antibiotics Unasyn  3 gm q 6 hours IV  PICC Care per protocol Labs weekly while on IV antibiotics      CBC w diff   Comprehensive met panel ESR CRP   Planned duration of antibiotics 4-6 weeks  Stop date Oct 19  Follow up clinic date 3-4 weeks  FAX weekly labs to [phone]  Leonel Ramsay, MD

## 2017-07-16 NOTE — Progress Notes (Signed)
[place] INFECTIOUS DISEASE PROGRESS NOTE Date of Admission:  07/10/2017     ID: Stanley Nguyen is a 71 y.o. male with  Foot infection  Active Problems:   Acute osteomyelitis of metatarsal bone of right foot (HCC)   Subjective: No fevers, tolertating abx   ROS  Eleven systems are reviewed and negative except per hpi  Medications:  Antibiotics Given (last 72 hours)    Date/Time Action Medication Dose Rate   07/13/17 1936 New Bag/Given   Ampicillin-Sulbactam (UNASYN) 3 g in sodium chloride 0.9 % 100 mL IVPB 3 g 200 mL/hr   07/14/17 0246 New Bag/Given   Ampicillin-Sulbactam (UNASYN) 3 g in sodium chloride 0.9 % 100 mL IVPB 3 g 200 mL/hr   07/14/17 0849 New Bag/Given   Ampicillin-Sulbactam (UNASYN) 3 g in sodium chloride 0.9 % 100 mL IVPB 3 g 200 mL/hr   07/14/17 1447 New Bag/Given   Ampicillin-Sulbactam (UNASYN) 3 g in sodium chloride 0.9 % 100 mL IVPB 3 g 200 mL/hr   07/14/17 1930 New Bag/Given   Ampicillin-Sulbactam (UNASYN) 3 g in sodium chloride 0.9 % 100 mL IVPB 3 g 200 mL/hr   07/15/17 0206 New Bag/Given   Ampicillin-Sulbactam (UNASYN) 3 g in sodium chloride 0.9 % 100 mL IVPB 3 g 200 mL/hr   07/15/17 0804 New Bag/Given   Ampicillin-Sulbactam (UNASYN) 3 g in sodium chloride 0.9 % 100 mL IVPB 3 g 200 mL/hr   07/15/17 1318 New Bag/Given   Ampicillin-Sulbactam (UNASYN) 3 g in sodium chloride 0.9 % 100 mL IVPB 3 g 200 mL/hr   07/15/17 2225 New Bag/Given   Ampicillin-Sulbactam (UNASYN) 3 g in sodium chloride 0.9 % 100 mL IVPB 3 g 200 mL/hr   07/16/17 0110 New Bag/Given   Ampicillin-Sulbactam (UNASYN) 3 g in sodium chloride 0.9 % 100 mL IVPB 3 g 200 mL/hr   07/16/17 1001 New Bag/Given  [Med niot available. Pharm notified]   Ampicillin-Sulbactam (UNASYN) 3 g in sodium chloride 0.9 % 100 mL IVPB 3 g 200 mL/hr     . amLODipine  10 mg Oral Daily  . busPIRone  7.5 mg Oral BID  . Chlorhexidine Gluconate Cloth  6 each Topical Daily  . gabapentin  300 mg Oral TID  . lacosamide   50 mg Oral BID  . lisinopril  10 mg Oral Daily  . mupirocin ointment  1 application Nasal BID  . nicotine  21 mg Transdermal Daily  . oxyCODONE  30 mg Oral Q8H  . polyethylene glycol  17 g Oral Daily  . senna-docusate  1 tablet Oral BID  . sodium chloride flush  10-40 mL Intracatheter Q12H  . sotalol  80 mg Oral Daily  . tiotropium  1 capsule Inhalation Daily  . zolpidem  5 mg Oral QHS    Objective: Vital signs in last 24 hours: Temp:  [98.3 F (36.8 C)] 98.3 F (36.8 C) (09/17 2111) Pulse Rate:  [63-64] 63 (09/17 2111) Resp:  [19] 19 (09/17 2111) BP: (121-148)/(49-58) 122/52 (09/18 0943) SpO2:  [99 %] 99 % (09/17 2111) Weight:  [76.3 kg (168 lb 4.8 oz)] 76.3 kg (168 lb 4.8 oz) (09/18 0344) Constitutional: He is oriented to person, place, and time.thin, frail  HENT: anicteric  Mouth/Throat: Oropharynx is clear and moist. No oropharyngeal exudate.  Cardiovascular: Normal rate, regular rhythm and normal heart sounds. Pulmonary/Chest: Effort normal and breath sounds normal. No respiratory distress. He has no wheezes.  Abdominal: Soft. Bowel sounds are normal. He exhibits no distension.  There is no tenderness.  Lymphadenopathy: He has no cervical adenopathy.  Neurological: He is alert and oriented to person, place, and time.  Skin: Derm:The dorsal aspect of his right foot has a wound vac in place Psychiatric: He has a normal mood and affect. His behavior is normal.   Lab Results  Recent Labs  07/15/17 0515 07/16/17 0600  WBC 7.6 8.8  HGB 9.9* 9.7*  HCT 28.3* 27.5*  NA 130* 130*  K 3.9 3.9  CL 99* 98*  CO2 27 28  BUN 11 13  CREATININE 0.99 1.01   Lab Results  Component Value Date   ESRSEDRATE >140 (H) 07/12/2017   Lab Results  Component Value Date   CRP 6.6 (H) 07/11/2017    Microbiology: Results for orders placed or performed during the hospital encounter of 07/10/17  Wound or Superficial Culture     Status: None   Collection Time: 07/10/17  4:30 PM  Result  Value Ref Range Status   Specimen Description FOOT  Final   Special Requests NONE  Final   Gram Stain   Final    RARE WBC SEEN FEW GRAM POSITIVE COCCI Performed at [facility], [place] [address] [place], [place] [address]    Culture MODERATE STAPHYLOCOCCUS AUREUS  Final   Report Status 07/13/2017 FINAL  Final   Organism ID, Bacteria STAPHYLOCOCCUS AUREUS  Final      Susceptibility   Staphylococcus aureus - MIC*    CIPROFLOXACIN 1 SENSITIVE Sensitive     ERYTHROMYCIN <=0.25 SENSITIVE Sensitive     GENTAMICIN <=0.5 SENSITIVE Sensitive     OXACILLIN 0.5 SENSITIVE Sensitive     TETRACYCLINE <=1 SENSITIVE Sensitive     VANCOMYCIN <=0.5 SENSITIVE Sensitive     TRIMETH/SULFA <=10 SENSITIVE Sensitive     CLINDAMYCIN <=0.25 SENSITIVE Sensitive     RIFAMPIN <=0.5 SENSITIVE Sensitive     Inducible Clindamycin NEGATIVE Sensitive     * MODERATE STAPHYLOCOCCUS AUREUS  Culture, blood (routine x 2)     Status: None   Collection Time: 07/10/17  4:30 PM  Result Value Ref Range Status   Specimen Description BLOOD BLOOD LEFT FOREARM  Final   Special Requests   Final    BOTTLES DRAWN AEROBIC AND ANAEROBIC Blood Culture results may not be optimal due to an excessive volume of blood received in culture bottles   Culture NO GROWTH 5 DAYS  Final   Report Status 07/15/2017 FINAL  Final  Culture, blood (routine x 2)     Status: None   Collection Time: 07/10/17  4:30 PM  Result Value Ref Range Status   Specimen Description BLOOD LEFT ANTECUBITAL  Final   Special Requests   Final    BOTTLES DRAWN AEROBIC AND ANAEROBIC Blood Culture results may not be optimal due to an excessive volume of blood received in culture bottles   Culture NO GROWTH 5 DAYS  Final   Report Status 07/15/2017 FINAL  Final  Surgical pcr screen     Status: Abnormal   Collection Time: 07/10/17  9:05 PM  Result Value Ref Range Status   MRSA, PCR NEGATIVE NEGATIVE Final   Staphylococcus aureus POSITIVE (A) NEGATIVE Final     Comment: (NOTE) The Xpert SA Assay (FDA approved for NASAL specimens in patients 29 years of age and older), is one component of a comprehensive surveillance program. It is not intended to diagnose infection nor to guide or monitor treatment.   Aerobic/Anaerobic Culture (surgical/deep wound)  Status: None   Collection Time: 07/12/17  9:57 AM  Result Value Ref Range Status   Specimen Description FOOT RIGHT first metatarsal SWAB  Final   Special Requests NONE  Final   Gram Stain   Final    FEW WBC PRESENT, PREDOMINANTLY PMN FEW GRAM POSITIVE COCCI IN CLUSTERS Performed at [facility], [place] [address], [place], [place] [address]    Culture   Final    FEW STAPHYLOCOCCUS AUREUS FEW STREPTOCOCCUS ANGINOSIS    Report Status 07/16/2017 FINAL  Final   Organism ID, Bacteria STREPTOCOCCUS ANGINOSIS  Final      Susceptibility   Streptococcus anginosis - MIC*    PENICILLIN <=0.06 SENSITIVE Sensitive     CEFTRIAXONE 0.25 SENSITIVE Sensitive     ERYTHROMYCIN <=0.12 SENSITIVE Sensitive     LEVOFLOXACIN 0.5 SENSITIVE Sensitive     VANCOMYCIN 1 SENSITIVE Sensitive     * FEW STREPTOCOCCUS ANGINOSIS    Studies/Results: No results found.  Assessment/Plan: Stanley Nguyen is a 71 y.o. male with necrotic wound on dorsum of R foot with diminished circulation and exposed tendon and hardware. Xray suggests osteomyelitis. He had  debridement and hardware removal by Dr Vickki Muff 9/14. Cultures with MSSA and Strep anginosis. ESR >140, CRP 6.6 on 9/14. Revascularization 9/17.  Will need femoral endartectomy this week  Recommendations Continue  unasyn - will not narrow to just ancef given extensive necrosis and likey anaerobic infection -will need 4- 6 weeks IV abx therapy. Discussed with patient. Please see abx order sheet. Thank you very much for the consult. Will follow with you.  Leonel Ramsay   07/16/2017, 2:35 PM

## 2017-07-17 ENCOUNTER — Inpatient Hospital Stay: Payer: Medicare HMO | Admitting: Anesthesiology

## 2017-07-17 ENCOUNTER — Inpatient Hospital Stay: Payer: Medicare HMO

## 2017-07-17 ENCOUNTER — Encounter
Admission: EM | Disposition: A | Discharge status: Skilled Nursing Facility | Payer: Self-pay | Source: Home / Self Care | Attending: Internal Medicine

## 2017-07-17 DIAGNOSIS — I70238 Atherosclerosis of native arteries of right leg with ulceration of other part of lower right leg: Secondary | ICD-10-CM

## 2017-07-17 HISTORY — PX: LOWER EXTREMITY ANGIOGRAM: SHX5508

## 2017-07-17 HISTORY — PX: ENDARTERECTOMY FEMORAL: SHX5804

## 2017-07-17 LAB — BASIC METABOLIC PANEL
Anion gap: 5 (ref 5–15)
BUN: 11 mg/dL (ref 6–20)
CHLORIDE: 100 mmol/L — AB (ref 101–111)
CO2: 27 mmol/L (ref 22–32)
Calcium: 7.9 mg/dL — ABNORMAL LOW (ref 8.9–10.3)
Creatinine, Ser: 0.87 mg/dL (ref 0.61–1.24)
GFR calc non Af Amer: >60 mL/min (ref >60)
Glucose, Bld: 95 mg/dL (ref 65–99)
POTASSIUM: 3.7 mmol/L (ref 3.5–5.1)
SODIUM: 132 mmol/L — AB (ref 135–145)

## 2017-07-17 LAB — CBC
HCT: 26.4 % — ABNORMAL LOW (ref 40.0–52.0)
Hemoglobin: 9.3 g/dL — ABNORMAL LOW (ref 13.0–18.0)
MCH: 30.9 pg (ref 26.0–34.0)
MCHC: 35.1 g/dL (ref 32.0–36.0)
MCV: 88 fL (ref 80.0–100.0)
Platelets: 147 10*3/uL — ABNORMAL LOW (ref 150–440)
RBC: 3 MIL/uL — ABNORMAL LOW (ref 4.40–5.90)
RDW: 14.3 % (ref 11.5–14.5)
WBC: 8.6 10*3/uL (ref 3.8–10.6)

## 2017-07-17 LAB — HEPARIN LEVEL (UNFRACTIONATED): Heparin Unfractionated: 0.35 IU/mL (ref 0.30–0.70)

## 2017-07-17 LAB — GLUCOSE, CAPILLARY
GLUCOSE-CAPILLARY: 88 mg/dL (ref 65–99)
GLUCOSE-CAPILLARY: 83 mg/dL (ref 65–99)
GLUCOSE-CAPILLARY: 94 mg/dL (ref 65–99)

## 2017-07-17 SURGERY — ENDARTERECTOMY, FEMORAL
Anesthesia: General | Site: Groin | Laterality: Right | Wound class: Clean

## 2017-07-17 SURGERY — ENDARTERECTOMY, FEMORAL
Anesthesia: General | Laterality: Right

## 2017-07-17 MED ORDER — EPHEDRINE SULFATE 50 MG/ML IJ SOLN
INTRAMUSCULAR | Status: DC | PRN
  Administered 2017-07-17: 10 mg via INTRAVENOUS

## 2017-07-17 MED ORDER — HALOPERIDOL LACTATE 5 MG/ML IJ SOLN
INTRAMUSCULAR | Status: AC
  Filled 2017-07-17: qty 1

## 2017-07-17 MED ORDER — LIDOCAINE HCL (PF) 2 % IJ SOLN
INTRAMUSCULAR | Status: AC
  Filled 2017-07-17: qty 2

## 2017-07-17 MED ORDER — PROPOFOL 10 MG/ML IV BOLUS
INTRAVENOUS | Status: AC
  Filled 2017-07-17: qty 20

## 2017-07-17 MED ORDER — PHENYLEPHRINE HCL 10 MG/ML IJ SOLN
INTRAMUSCULAR | Status: DC | PRN
  Administered 2017-07-17: 50 ug via INTRAVENOUS
  Administered 2017-07-17: 100 ug via INTRAVENOUS

## 2017-07-17 MED ORDER — FENTANYL CITRATE (PF) 100 MCG/2ML IJ SOLN
INTRAMUSCULAR | Status: AC
  Administered 2017-07-17: 25 ug via INTRAVENOUS
  Filled 2017-07-17: qty 2

## 2017-07-17 MED ORDER — FENTANYL CITRATE (PF) 100 MCG/2ML IJ SOLN
25.0000 ug | INTRAMUSCULAR | Status: AC | PRN
  Administered 2017-07-17 (×4): 25 ug via INTRAVENOUS

## 2017-07-17 MED ORDER — MORPHINE SULFATE (PF) 2 MG/ML IV SOLN: 2.0000 mg | INTRAVENOUS | Status: DC | PRN

## 2017-07-17 MED ORDER — FENTANYL CITRATE (PF) 100 MCG/2ML IJ SOLN
INTRAMUSCULAR | Status: AC
  Filled 2017-07-17: qty 2

## 2017-07-17 MED ORDER — SEVOFLURANE IN SOLN
RESPIRATORY_TRACT | Status: AC
  Filled 2017-07-17: qty 250

## 2017-07-17 MED ORDER — PROPOFOL 10 MG/ML IV BOLUS
INTRAVENOUS | Status: DC | PRN
  Administered 2017-07-17: 100 mg via INTRAVENOUS

## 2017-07-17 MED ORDER — HEPARIN SOD (PORK) LOCK FLUSH 10 UNIT/ML IV SOLN
INTRAVENOUS | Status: DC | PRN
  Administered 2017-07-17: 10 [IU]

## 2017-07-17 MED ORDER — SODIUM CHLORIDE 0.9 % IV SOLN
INTRAVENOUS | Status: DC | PRN
  Administered 2017-07-17: 25 ug/min via INTRAVENOUS

## 2017-07-17 MED ORDER — LIDOCAINE HCL (CARDIAC) 20 MG/ML IV SOLN
INTRAVENOUS | Status: DC | PRN
  Administered 2017-07-17: 50 mg via INTRAVENOUS

## 2017-07-17 MED ORDER — GLYCOPYRROLATE 0.2 MG/ML IJ SOLN
INTRAMUSCULAR | Status: DC | PRN
  Administered 2017-07-17: 0.2 mg via INTRAVENOUS

## 2017-07-17 MED ORDER — MIDAZOLAM HCL 2 MG/2ML IJ SOLN
INTRAMUSCULAR | Status: AC
  Filled 2017-07-17: qty 2

## 2017-07-17 MED ORDER — FENTANYL CITRATE (PF) 100 MCG/2ML IJ SOLN
INTRAMUSCULAR | Status: DC | PRN
  Administered 2017-07-17: 25 ug via INTRAVENOUS
  Administered 2017-07-17 (×2): 50 ug via INTRAVENOUS
  Administered 2017-07-17: 25 ug via INTRAVENOUS
  Administered 2017-07-17 (×2): 50 ug via INTRAVENOUS
  Administered 2017-07-17 (×2): 25 ug via INTRAVENOUS

## 2017-07-17 MED ORDER — HEPARIN SODIUM (PORCINE) 1000 UNIT/ML IJ SOLN
INTRAMUSCULAR | Status: DC | PRN
  Administered 2017-07-17: 1000 [IU] via INTRAVENOUS
  Administered 2017-07-17: 4000 [IU] via INTRAVENOUS

## 2017-07-17 MED ORDER — FENTANYL CITRATE (PF) 100 MCG/2ML IJ SOLN
25.0000 ug | INTRAMUSCULAR | Status: DC | PRN
  Administered 2017-07-17 (×2): 25 ug via INTRAVENOUS

## 2017-07-17 MED ORDER — HALOPERIDOL LACTATE 5 MG/ML IJ SOLN
5.0000 mg | Freq: Once | INTRAMUSCULAR | Status: DC
  Administered 2017-07-17: 5 mg via INTRAVENOUS

## 2017-07-17 MED ORDER — ONDANSETRON HCL 4 MG/2ML IJ SOLN
INTRAMUSCULAR | Status: AC
  Filled 2017-07-17: qty 2

## 2017-07-17 MED ORDER — EVICEL 2 ML EX KIT
PACK | CUTANEOUS | Status: DC | PRN
  Administered 2017-07-17: 1

## 2017-07-17 MED ORDER — PHENYLEPHRINE HCL 10 MG/ML IJ SOLN
INTRAMUSCULAR | Status: AC
  Filled 2017-07-17: qty 1

## 2017-07-17 MED ORDER — HEPARIN SODIUM (PORCINE) 5000 UNIT/ML IJ SOLN
INTRAMUSCULAR | Status: AC
  Filled 2017-07-17: qty 1

## 2017-07-17 MED ORDER — ONDANSETRON HCL 4 MG/2ML IJ SOLN
INTRAMUSCULAR | Status: DC | PRN
  Administered 2017-07-17: 4 mg via INTRAVENOUS

## 2017-07-17 MED ORDER — MIDAZOLAM HCL 2 MG/2ML IJ SOLN
INTRAMUSCULAR | Status: DC | PRN
  Administered 2017-07-17: 2 mg via INTRAVENOUS

## 2017-07-17 MED ORDER — ONDANSETRON HCL 4 MG/2ML IJ SOLN: 4.0000 mg | Freq: Once | INTRAMUSCULAR | Status: DC | PRN

## 2017-07-17 SURGICAL SUPPLY — 67 items
APPLIER CLIP 11 MED OPEN (CLIP)
APPLIER CLIP 9.375 SM OPEN (CLIP)
BAG COUNTER SPONGE EZ (MISCELLANEOUS) IMPLANT
BAG DECANTER FOR FLEXI CONT (MISCELLANEOUS) ×3 IMPLANT
BALLN ULTRVRSE 6X150X130C (BALLOONS) ×3
BALLOON ULTRVRSE 6X150X130C (BALLOONS) ×1 IMPLANT
BLADE SURG 15 STRL LF DISP TIS (BLADE) ×1 IMPLANT
BLADE SURG 15 STRL SS (BLADE) ×2
BLADE SURG SZ11 CARB STEEL (BLADE) ×3 IMPLANT
BOOT SUTURE AID YELLOW STND (SUTURE) ×3 IMPLANT
BRUSH SCRUB EZ  4% CHG (MISCELLANEOUS) ×2
BRUSH SCRUB EZ 4% CHG (MISCELLANEOUS) ×1 IMPLANT
CANISTER SUCT 1200ML W/VALVE (MISCELLANEOUS) ×3 IMPLANT
CATH BEACON 5 .035 40 KMP TP (CATHETERS) ×1 IMPLANT
CATH BEACON 5 .038 40 KMP TP (CATHETERS) ×2
CLIP APPLIE 11 MED OPEN (CLIP) IMPLANT
CLIP APPLIE 9.375 SM OPEN (CLIP) IMPLANT
COUNTER SPONGE BAG EZ (MISCELLANEOUS)
DERMABOND ADVANCED (GAUZE/BANDAGES/DRESSINGS) ×2
DERMABOND ADVANCED .7 DNX12 (GAUZE/BANDAGES/DRESSINGS) ×1 IMPLANT
DEVICE PRESTO INFLATION (MISCELLANEOUS) ×3 IMPLANT
DRAPE INCISE IOBAN 66X45 STRL (DRAPES) ×3 IMPLANT
DRAPE LAPAROTOMY 100X77 ABD (DRAPES) ×3 IMPLANT
DURAPREP 26ML APPLICATOR (WOUND CARE) ×3 IMPLANT
ELECT CAUTERY BLADE 6.4 (BLADE) ×3 IMPLANT
ELECT REM PT RETURN 9FT ADLT (ELECTROSURGICAL) ×3
ELECTRODE REM PT RTRN 9FT ADLT (ELECTROSURGICAL) ×1 IMPLANT
GLIDEWIRE ADV .035X180CM (WIRE) ×3 IMPLANT
GLOVE BIO SURGEON STRL SZ7 (GLOVE) ×6 IMPLANT
GLOVE INDICATOR 7.5 STRL GRN (GLOVE) ×3 IMPLANT
GOWN STRL REUS W/ TWL LRG LVL3 (GOWN DISPOSABLE) ×1 IMPLANT
GOWN STRL REUS W/ TWL XL LVL3 (GOWN DISPOSABLE) ×1 IMPLANT
GOWN STRL REUS W/TWL LRG LVL3 (GOWN DISPOSABLE) ×2
GOWN STRL REUS W/TWL XL LVL3 (GOWN DISPOSABLE) ×2
HEMOSTAT SURGICEL 2X3 (HEMOSTASIS) ×6 IMPLANT
IV NS 500ML (IV SOLUTION) ×2
IV NS 500ML BAXH (IV SOLUTION) ×1 IMPLANT
KIT RM TURNOVER STRD PROC AR (KITS) ×3 IMPLANT
LABEL OR SOLS (LABEL) ×3 IMPLANT
LOOP RED MAXI  1X406MM (MISCELLANEOUS) ×2
LOOP VESSEL MAXI 1X406 RED (MISCELLANEOUS) ×1 IMPLANT
LOOP VESSEL MINI 0.8X406 BLUE (MISCELLANEOUS) ×1 IMPLANT
LOOPS BLUE MINI 0.8X406MM (MISCELLANEOUS) ×2
NS IRRIG 500ML POUR BTL (IV SOLUTION) ×3 IMPLANT
PACK ANGIOGRAPHY (CUSTOM PROCEDURE TRAY) ×3 IMPLANT
PACK BASIN MAJOR ARMC (MISCELLANEOUS) ×3 IMPLANT
PATCH CAROTID ECM VASC 1X10 (Prosthesis & Implant Heart) ×3 IMPLANT
SHEATH BRITE TIP 8FRX11 (SHEATH) ×3 IMPLANT
STENT VIABAHN 6X150X120 (Permanent Stent) ×3 IMPLANT
SUT PROLENE 5 0 RB 1 DA (SUTURE) ×6 IMPLANT
SUT PROLENE 6 0 BV (SUTURE) ×27 IMPLANT
SUT PROLENE 7 0 BV 1 (SUTURE) ×6 IMPLANT
SUT SILK 2 0 (SUTURE) ×2
SUT SILK 2-0 18XBRD TIE 12 (SUTURE) ×1 IMPLANT
SUT SILK 3 0 (SUTURE) ×2
SUT SILK 3-0 18XBRD TIE 12 (SUTURE) ×1 IMPLANT
SUT SILK 4 0 (SUTURE) ×2
SUT SILK 4-0 18XBRD TIE 12 (SUTURE) ×1 IMPLANT
SUT VIC AB 2-0 CT1 27 (SUTURE) ×4
SUT VIC AB 2-0 CT1 TAPERPNT 27 (SUTURE) ×2 IMPLANT
SUT VIC AB 3-0 SH 27 (SUTURE) ×2
SUT VIC AB 3-0 SH 27X BRD (SUTURE) ×1 IMPLANT
SUT VICRYL+ 3-0 36IN CT-1 (SUTURE) ×6 IMPLANT
SYR 20CC LL (SYRINGE) ×3 IMPLANT
SYR 5ML LL (SYRINGE) ×3 IMPLANT
TRAY FOLEY W/METER SILVER 16FR (SET/KITS/TRAYS/PACK) ×3 IMPLANT
WIRE G V18X300CM (WIRE) ×3 IMPLANT

## 2017-07-17 NOTE — Anesthesia Procedure Notes (Signed)
Procedure Name: LMA Insertion Performed by: Madisan Bice Pre-anesthesia Checklist: Patient identified, Patient being monitored, Timeout performed, Emergency Drugs available and Suction available Patient Re-evaluated:Patient Re-evaluated prior to induction Oxygen Delivery Method: Circle system utilized Preoxygenation: Pre-oxygenation with 100% oxygen Induction Type: IV induction Ventilation: Mask ventilation without difficulty LMA: LMA inserted LMA Size: 4.5 Tube type: Oral Number of attempts: 1 Placement Confirmation: positive ETCO2 and breath sounds checked- equal and bilateral Tube secured with: Tape Dental Injury: Teeth and Oropharynx as per pre-operative assessment        

## 2017-07-17 NOTE — Progress Notes (Signed)
[facility] [place] at [facility]   PATIENT NAME: Stanley Nguyen    MR#:  [id number]  DATE OF BIRTH:  [birth year]  SUBJECTIVE:   Patient seen this morning. He has not had breakfast. He is going for surgery this morning.   REVIEW OF SYSTEMS:    Review of Systems  Constitutional: Negative for fever, chills weight loss HENT: Negative for ear pain, nosebleeds, congestion, facial swelling, rhinorrhea, neck pain, neck stiffness and ear discharge.   Respiratory: Negative for cough, shortness of breath, wheezing  Cardiovascular: Negative for chest pain, palpitations and leg swelling.  Gastrointestinal: Negative for heartburn, abdominal pain, vomiting, diarrhea or consitpation Genitourinary: Negative for dysuria, urgency, frequency, hematuria Musculoskeletal: Negative for back pain or joint pain Neurological: Negative for dizziness, seizures, syncope, focal weakness,  numbness and headaches.  Hematological: Does not bruise/bleed easily.  Psychiatric/Behavioral: Negative for hallucinations, confusion, dysphoric mood    Tolerating Diet: Nothing by mouth      DRUG ALLERGIES:   Allergies  Allergen Reactions  . Keppra [Levetiracetam] Rash    VITALS:  Blood pressure 128/60, pulse 74, temperature 98.4 F (36.9 C), resp. rate 16, height  (1.778 m), weight 80.3 kg (177 lb 1.6 oz), SpO2 98 %.  PHYSICAL EXAMINATION:  Constitutional: Appears well-developed and well-nourished. No distress. HENT: Normocephalic. Marland Kitchen Oropharynx is clear and moist.  Eyes: Conjunctivae and EOM are normal. PERRLA, no scleral icterus.  Neck: Normal ROM. Neck supple. No JVD. No tracheal deviation. CVS: RRR, S1/S2 +, no murmurs, no gallops, no carotid bruit.  Pulmonary: Effort and breath sounds normal, no stridor, rhonchi, wheezes, rales.  Abdominal: Soft. BS +,  no distension, tenderness, rebound or guarding.  Musculoskeletal: Normal range of motion. No edema and no tenderness.  Wound VAC  placed Neuro: Alert. CN 2-12 grossly intact. No focal deficits. Skin: Skin is warm and dry. No rash noted. Psychiatric: Normal mood and affect.      LABORATORY PANEL:   CBC  Recent Labs Lab 07/17/17 0455  WBC 8.6  HGB 9.3*  HCT 26.4*  PLT 147*   ------------------------------------------------------------------------------------------------------------------  Chemistries   Recent Labs Lab 07/10/17 1630  07/17/17 0455  NA 131*  < > 132*  K 4.4  < > 3.7  CL 98*  < > 100*  CO2 25  < > 27  GLUCOSE 109*  < > 95  BUN 12  < > 11  CREATININE 1.04  < > 0.87  CALCIUM 9.1  < > 7.9*  AST 24  --   --   ALT 14*  --   --   ALKPHOS 86  --   --   BILITOT 1.0  --   --   < > = values in this interval not displayed. ------------------------------------------------------------------------------------------------------------------  Cardiac Enzymes No results for input(s): TROPONINI in the last 168 hours. ------------------------------------------------------------------------------------------------------------------  RADIOLOGY:  No results found.   ASSESSMENT AND PLAN:   71 year old male with history of seizure disorder and diabetes who presents with acute osteomyelitis of right first metatarsal.  1.acute osteomyelitis dorsum of right foot status post debridement and hardware removal postoperative day #5 with PAD Cultures with MSSA. PICC line placed Continue Unasyn as per ID consultation. Stop date is October 19. Continue wound VAC to be change  Tuesday, Thursday and Saturday  Patient underwent angiogram 9/16 and had 2 stents placed in right and left common iliac artery Patient plan for femoral endarterectomy this morning..  2. Hyponatremia: Sodium levels stable   3. Anemia of  chronic disease: Hemoglobin remains stable 4. History of chronic atrial fibrillation: Can change back to oral anticoagulation after femoral procedure Wednesday Continue sotalol  5. COPD without  exacerbation: Continue inhalers  6. Tobacco dependence: Patient is encouraged to quit smoking. continue nicotine patch  7. Elevated blood pressure without history of hypertension: continue Norvascand  lisinopril  Monitor BP/creatinine   D/w dr schnier Management plans discussed with the patient and he is in agreement.  CODE STATUS: full  TOTAL TIME TAKING CARE OF THIS PATIENT: 23 minutes.   Discussed with nursing staff  POSSIBLE D/C 2 days, DEPENDING ON CLINICAL CONDITION.   Lael Pilch M.D on 07/17/2017 at 10:17 AM  Between 7am to 6pm - Pager - [phone] After 6pm go to [email or web address] - password [email or web address]  [facility] [place] [facility]  Office  [phone]  CC: Primary care physician; [name], MD  Note: This dictation was prepared with Dragon dictation along with smaller phrase technology. Any transcriptional errors that result from this process are unintentional.

## 2017-07-17 NOTE — H&P (Signed)
[facility] History & Physical Update  The patient was interviewed and re-examined.  The patient's previous History and Physical has been reviewed and is unchanged.  There is no change in the plan of care. We plan to proceed with the scheduled procedure.  Festus Barren, MD  07/17/2017, 12:38 PM

## 2017-07-17 NOTE — Anesthesia Preprocedure Evaluation (Signed)
Anesthesia Evaluation  Patient identified by MRN, date of birth, ID band Patient awake    Reviewed: Allergy & Precautions, H&P , NPO status , Patient's Chart, lab work & pertinent test results, reviewed documented beta blocker date and time   Airway Mallampati: III  TM Distance: >3 FB Neck ROM: full    Dental  (+) Teeth Intact   Pulmonary neg pulmonary ROS, COPD,  COPD inhaler, Current Smoker,    Pulmonary exam normal        Cardiovascular Exercise Tolerance: Poor + CAD  negative cardio ROS Normal cardiovascular exam Rate:Normal     Neuro/Psych negative neurological ROS  negative psych ROS   GI/Hepatic negative GI ROS, Neg liver ROS,   Endo/Other  negative endocrine ROSdiabetes  Renal/GU negative Renal ROS  negative genitourinary   Musculoskeletal   Abdominal   Peds  Hematology negative hematology ROS (+)   Anesthesia Other Findings   Reproductive/Obstetrics negative OB ROS                             Anesthesia Physical Anesthesia Plan  ASA: III  Anesthesia Plan: General LMA   Post-op Pain Management:    Induction:   PONV Risk Score and Plan: 2 and Ondansetron and Dexamethasone  Airway Management Planned:   Additional Equipment:   Intra-op Plan:   Post-operative Plan:   Informed Consent: I have reviewed the patients History and Physical, chart, labs and discussed the procedure including the risks, benefits and alternatives for the proposed anesthesia with the patient or authorized representative who has indicated his/her understanding and acceptance.     Plan Discussed with: CRNA  Anesthesia Plan Comments:         Anesthesia Quick Evaluation

## 2017-07-17 NOTE — Op Note (Signed)
OPERATIVE NOTE   PROCEDURE: 1. right common femoral, superficial femoral and profunda femoris endarterectomy with Cormatrix patch angioplasty 2. Introduction catheter into right peroneal artery from open right femoral approach 3. Right lower extremity angiography. 4. Open angioplasty and stent placement with a 6 mm x 15 cm Viabahn stent right superficial femoral artery  PRE-OPERATIVE DIAGNOSIS: Atherosclerotic occlusive disease bilateral lower extremities with lifestyle limiting claudication and mild rest pain symptoms right lower extremity; hypertension; diabetes mellitus  POST-OPERATIVE DIAGNOSIS: Same  CO-SURGEON: Katha Cabal, MD and Algernon Huxley, M.D.  ASSISTANT(S): None  ANESTHESIA: general  ESTIMATED BLOOD LOSS: 300 cc  FINDING(S): 1. Profound calcific plaque noted right lower extremity extending past the bifurcation of the profunda femoris arteries and the common femoral artery as well as down the extensive length of the SFA  SPECIMEN(S):  Calcific plaque from the common femoral, superficial femoral and the profunda femoris arteries right side  INDICATIONS:   Stanley Nguyen 71 y.o. y.o.male who presents with complaints of lifestyle limiting claudication and pain continuously in the right foot. The patient has documented severe atherosclerotic occlusive disease and has undergone multiple minimally invasive treatments in the past. However, at this point his primary area of stricture stenosis resides in the common femoral and origins of the superficial femoral and profunda femoris extending into these arteries and therefore this is not amenable to intervention and he is now undergoing open endarterectomy. The risks and benefits of been reviewed with the patient, all questions have answered; alternative therapies have been reviewed as well and the patient has agreed to proceed with surgical open repair.  DESCRIPTION: After obtaining full informed written consent, the  patient was brought back to the operating room and placed supine upon the operating table.  The patient received IV antibiotics prior to induction.  After obtaining adequate anesthesia, the patient was prepped and draped in the standard fashion for: right femoral exposure.  Attention was turned to the right groin with Dr. Lucky Cowboy working on the patient's right and myself working on the left of the patient.  Vertical  Incision was made over the common femoral artery and dissected down to the common femoral artery with electrocautery. The dissection of the common femoral artery from the distal external iliac artery (identified by the superficial circumflex vessels) down to the femoral bifurcation was then performed.  On initial inspection, the common femoral artery was: Densely calcified and there was no palpable pulse noted bilaterally.    Subsequently the dissection was continued to include all circumflex branches and the profunda femoral artery and superficial femoral artery. The superficial femoral artery was dissected circumferentially for a distance of approximately 3-4 cm and the profunda femoris was dissected circumferentially out to the fourth order branches individual vessel loops were placed around each branch. Control of all branches was obtained with vessel loops.  A softer area in the distal external iliac artery amendable to clamping was identified.    The patient was given 5000 units of Heparin intravenously, which was a therapeutic bolus.   After waiting 3 minutes, the right distal external iliac artery was clamped and placed all circumflex branches, and the profunda and superficial femoral arteries under tension.  Arteriotomy was made in the common femoral artery with a 11-blade and extended it with a Potts scissor proximally and distally extending the distal end down the SFA for approximately 3 cm.   Endarterectomy was then performed under direct visualization using a freer elevator and a right  angle  from the mid common femoral extending the plane both proximally and distally. Proximally the endarterectomy was brought up to the level of the clamp where a clean edge was obtained. Distally the endarterectomy was carried down to a soft spot in the SFA where a feathered edge would was obtained.  7-0 Prolene interrupted tacking sutures were placed to secure the leading edge of the plaque in the SFA.  The profunda femoris was treated with an eversion technique extending endarterectomy approximately 2 cm distally again obtaining a featheredge on the right side.  An 8 French sheath was then inserted into the proximal SFA under direct visualization.  The 8 French sheath was secured using Silastic vessel loop. C-arm was then brought into the room and angiography of the superficial femoral artery was then performed by hand injection.   Multiple lesions within the mid and distal superficial femoral artery were identified and these were all in the 60-70% stenosis range. Wire was then advanced through these lesions followed by a catheter and hand injection in the trifurcation was performed demonstrating distal runoff. The catheter was then advanced into the peroneal artery and the 035 wire was exchanged for a VAT 0.018 wire. The C-arm was then repositioned over the superficial femoral artery hand injection contrast localized the lesions and a 6 mm x 15 cm Viabahn stent was opened onto the field and delivered across these stenoses. It was deployed without difficulty and subsequently postdilated with a 6 mm x 15 cm balloon inflated to 12 atm. Follow-up imaging was then obtained which demonstrated less than 10% residual stenosisand a fully expanded stent. Distal runoff was then reassessed and noted to be unchanged without evidence of embolization. The sheath was then removed and a profunda clamp reapplied to the SFA below the level of the arteriotomy.  At this point, we fashioned a core matrix patch for the geometry  of the arteriotomy.  The patch was sewn to the artery with 2 running stitches of 6-0 Prolene, running from each end.  Prior to completing the patch angioplasty, the profunda femoral artery was flushed as was the superficial femoral artery. The system was then forward flushed. The endarterectomy site was then irrigated copiously with heparinized saline. The patch angioplasty was completed in the usual fashion.  Flow was then reestablished first to the profunda femoris and then the superficial femoral artery. Any gaps or bleeding sites in the suture line were easily controlled with a 6-0 Prolene suture. Doppler is then delivered onto the field and the SFA as well as the profunda femoris arteries were interrogated and found to have triphasic Doppler signals.  The right groin was then irrigated copiously with sterile saline and subsequently Evicel and Surgicel were placed in the wound. The incision was repaired with a double layer of 2-0 Vicryl, a double layer of 3-0 Vicryl, and a layer of 4-0 Monocryl in a subcuticular fashion.  The skin was cleaned, dried, and reinforced with Dermabond.  COMPLICATIONS: None  CONDITION: Stanley Nguyen, M.D. [facility] Office: [phone]  07/17/2017, 5:59 PM

## 2017-07-17 NOTE — Op Note (Signed)
OPERATIVE NOTE   PROCEDURE: 1. Right common femoral, profunda femoris, and superficial femoral artery endarterectomies and patch angioplasty 2.   Catheter placement into right peroneal artery from right common femoral approach 3.   Right lower extremity angiogram 4.   Viable and stent placement to the right mid to distal SFA with a 6 mm diameter by 15 cm length stent postdilated with a 6 mm balloon    PRE-OPERATIVE DIAGNOSIS: 1.Atherosclerotic occlusive disease right lower extremities with ulceration and infection 2. S/p bilateral iliac artery intervention  POST-OPERATIVE DIAGNOSIS: Same  SURGEON: Festus Barren, MD  CO-surgeon: Levora Dredge, MD  ANESTHESIA: general  ESTIMATED BLOOD LOSS: 250 cc  FINDING(S): 1. significant plaque in right common femoral, profunda femoris, and superficial femoral arteries 2.  Moderate stenosis in the mid to distal SFA treated with stent placement  SPECIMEN(S): Right common femoral, profunda femoris, and superficial femoral artery plaque.  INDICATIONS:  Patient presents with ulceration and infection of the right foot. He has undergone an angiogram and iliac revascularization but his common femoral occlusion with the best treated with surgery. He also had some SFA disease and we were planning to approach this through the right femoral approach at the time of endarterectomy.  Right femoral endarterectomy is planned to try to improve perfusion. The risks and benefits as well as alternative therapies including intervention were reviewed in detail all questions were answered the patient agrees to proceed with surgery.  DESCRIPTION: After obtaining full informed written consent, the patient was brought back to the operating room and placed supine upon the operating table. The patient received IV antibiotics prior to induction. After obtaining adequate anesthesia, the patient was prepped and draped in the standard fashion appropriate time out is  called.   Vertical incision was created overlying the right femoral arteries. The common femoral artery proximally, and superficial femoral artery, and primary profunda femoris artery branches were encircled with vessel loops and prepared for control. The right femoral arteries were found to have significant plaque from the common femoral artery into the profunda and superficial femoral arteries. The dissection was carried until the stent placed in the external iliac artery was R proximal endpoint clamp to be placed just below this. We dissected out into the superficial femoral artery until the vessel normalized as seen on the angiogram a few centimeters below the bifurcation. A large medial codominant profunda was seen and encircled with a vessel loop as well.   5000 units of heparin was given and allowed circulate for 5 minutes.   Attention is then turned to the right femoral artery. An arteriotomy is made with 11 blade and extended with Potts scissors in the common femoral artery and carried down onto the first 1-2 cm of the superficial femoral artery. An endarterectomy was then performed. [employer] was used to create a plane. The proximal endpoint was cut flush with tenotomy scissors at the level of the proximal clamp. This was in the proximal common femoral artery. An eversion endarterectomy was then performed for the first 2-3 cm of the lateral primary profunda femoris artery. Gentle traction was performed with a nice feathered distal endpoint. Good backbleeding was then seen. The distal endpoint of the superficial femoral artery endarterectomy was created with gentle traction and the distal endpoint was reasonably clean. Always loose plaque was removed and the endpoint was tacked down with two 7-0 Prolene sutures.  I then turned my attention to the SFA disease. A J-wire and an 8 [place] sheath were placed  in the superficial femoral artery with vessel loop pulled up for control around  the sheath to prevent bleeding around the sheath. Imaging of the right lower extremity was performed which confirmed the suspected disease in the mid to distal superficial femoral artery over about a 10-12 cm segment. It was highly calcific. It did not appear quite as stenotic as it did on his original aortogram that was in the 60-70% range and multiple areas of the highly calcific disease. He still had two-vessel runoff distally. I then crossed the lesion without difficulty with the 0.035 Terumo advantage wire and a Kumpe catheter and confirmed intraluminal flow with the Kumpe catheter down into the proximal peroneal artery. I then exchanged for a 0.018 wire. A 6 mm diameter by 15 cm length by about stent was then deployed in the mid to distal superficial femoral artery encompassing the lesions. This was postdilated with a 6 mm x 15 cm length angioplasty balloon with excellent angiographic result and less than 10% residual stenosis with preserved flow distally. The sheath was then removed and we turned our attention back to closing the endarterectomy. The vessels were locally heparinized.  The Cormatrix patcth is then selected and prepared for a patch angioplasty.  It is cut and beveled and started at the proximal endpoint with a 6-0 Prolene suture.  Approximately one half of the suture line is run medially and laterally and the distal end point was cut and bevelled to match the arteriotomy.  A second 6-0 Prolene was started at the distal end point and run to the mid portion to complete the arteriotomy.  The vessel was flushed prior to release of control and completion of the anastomosis.  At this point, flow was established first to the profunda femoris artery and then to the superficial femoral artery. Easily palpable pulses are noted well beyond the anastomosis and both arteries. Several 6-0 Prolene patch sutures were used for hemostasis and hemostasis was achieved.   Surgicel and Evicel topical hemostatic  agents were placed in the femoral incision and hemostasis was complete. The femoral incision was then closed in a layered fashion with 2 layers of 2-0 Vicryl, 2 layers of 3-0 Vicryl, and 4-0 Monocryl for the skin closure. Dermabond and sterile dressing were then placed over the incision.  The patient was then awakened from anesthesia and taken to the recovery room in stable condition having tolerated the procedure well.  COMPLICATIONS: None  CONDITION: Stable     Festus Barren 07/17/2017 4:48 PM   This note was created with Dragon Medical transcription system. Any errors in dictation are purely unintentional.

## 2017-07-17 NOTE — Progress Notes (Signed)
eLink Physician-Brief Progress Note Patient Name: Stanley Nguyen DOB: 08-31-1946 MRN: [id number]   Date of Service  07/17/2017  HPI/Events of Note  1. 71 yo male admitted for Vascular Surgery d/t Atherosclerotic occlusive disease bilateral lower extremities with lifestyle limiting claudication and mild rest pain symptoms right lower extremity; hypertension; diabetes mellitus. Now s/p  1.  right common femoral, superficial femoral and profunda femoris endarterectomy with Cormatrix patch angioplasty 2. Introduction catheter into right peroneal artery from open right femoral approach 3. Right lower extremity angiography. 4. Open angioplasty and stent placement with a 6 mm x 15 cm Viabahn stent right superficial femoral artery VSS. Management per Vascular Surgery. PCCM does not appear to have been consulted.   eICU Interventions  No new orders. Continue management per Vascular Surgery.      Intervention Category Evaluation Type: New Patient Evaluation  Lenell Antu 07/17/2017, 7:10 PM

## 2017-07-17 NOTE — Plan of Care (Addendum)
Patient prepped for OR. Report called to OR and s/w Cindi Carbon, RN. Consents x2 completed.  RN assessment and VS revealed current stability.  Beta Blocker given with small sip of water, nicotene patch and inhaler given - NO other PO meds given since NPO since midnight.  Heparin infusing at 12/hr and NS at 20/hr. All jewelry, dentures and personal clothing removed. CHG x2  Completed.  Wife at bedside and will be in waiting room. Current plans are to transfer to ICU after surgery. Wife informed. IV morphine given at 0800 and 1200.

## 2017-07-17 NOTE — Progress Notes (Signed)
ANTICOAGULATION CONSULT NOTE - Follow up   Pharmacy Consult for Heparin  Indication: atrial fibrillation  Allergies  Allergen Reactions  . Keppra [Levetiracetam] Rash    Patient Measurements: Height:  (177.8 cm) Weight: 177 lb 1.6 oz (80.3 kg) IBW/kg (Calculated) : 73 Heparin Dosing Weight: 70.7 kg   Vital Signs: Temp: 98.4 F (36.9 C) (09/18 1944) Temp Source: Oral (09/18 1944) BP: 128/53 (09/18 1944) Pulse Rate: 70 (09/18 1944)  Labs:  Recent Labs  07/15/17 0515 07/16/17 0025 07/16/17 0600 07/17/17 0455  HGB 9.9*  --  9.7* 9.3*  HCT 28.3*  --  27.5* 26.4*  PLT 170  --  166 147*  HEPARINUNFRC 0.40 0.44 0.37 0.35  CREATININE 0.99  --  1.01 0.87    Estimated Creatinine Clearance: 81.6 mL/min (by C-G formula based on SCr of 0.87 mg/dL).  Assessment: Pharmacy consulted to dose heparin in this 71 year old male with AFib.   Pt was on Xarelto at home, last dose was on 9/11.  Goal of Therapy:  Heparin level 0.3-0.7 units/ml Monitor platelets by anticoagulation protocol: Yes   Plan:  Will order baseline aPTT and INR.  Give 4200 units bolus x 1 Start heparin infusion at 1050 units/hr Check anti-Xa level in 6 hours and daily while on heparin Continue to monitor H&H and platelets  9/13 0729 HL 0.57 Recheck HL in 6 hours and follow up, continue current rate of 1050u/hr  09/13 1700 Pharmacy instructed to reorder heparin drip with stop time of 0400 on 9/14 for surgery. Drip was reordered w/o bolus with appropriate stop time ordered. Nurse made aware of heparin plan.   0914 1230 Pt post-procedure. MD has reordered heparin drip at 1100 units/hr. Will decrease rate to 1050 units/hr because last therapeutic level was on that rate (see above). No heparin levels obtained while on 1100 units/hr. Will order heparin level 8h after restart of drip CBC in AM. Hgb and plt count stable from yesterday.  Heparin order currently with a stop time, after discussing with  hospitalist, will reorder without stop time to run continuously.   9/14:  HL @ 21:00 = 0.28 Will order Heparin 1000 units IV X 1 and increase drip rate to 1200 units/hr.  Will recheck HL 8 hrs after rate change.   9/15: Heparin level resulted @ 0.43. Will recheck Heparin level @ 1730.  Pharmacy will continue to follow.   9/15 1709  Heparin level resulted .41. Will continue current rate and recheck HL and CBC with AM labs.   9/16 @ 0600 HL 0.47 therapeutic. Will continue current rate and will recheck HL w/ am labs.  9/16 1500- 9/160 RN found Heparin drip turned off.  Will reschedule Heparin level for 8 hours after restart (2300).  9/16 @ 2300 HL 0.31 therapeutic. Will continue current rate consider drip was turned off and will recheck HL w/ am labs.  9/17 @ 0500 HL 0.40 therapeutic. Will continue current rate and will recheck w/ am labs.  1191 heparin stopped for procedure and restarted at 1108 by RN. Will retime heparin level for 1900 to check 8h level. CBC in AM.   Terryn Rosenkranz S, PharmD,BCPS Clinical Pharmacist 07/17/2017 6:24 AM    0918 0030 heparin level 0.44. Continue current regimen. Recheck in 6 hours to confirm.  4782 AM heparin level 0.37. Continue current regimen. Recheck heparin level and CBC with tomorrow AM labs.  0919 AM heparin level 0.35. Continue current regimen. Recheck heparin level and CBC with tomorrow AM  labs.  Fulton Reek, PharmD, BCPS  07/17/17 6:24 AM

## 2017-07-17 NOTE — Anesthesia Post-op Follow-up Note (Signed)
Anesthesia QCDR form completed.        

## 2017-07-17 NOTE — Transfer of Care (Signed)
Immediate Anesthesia Transfer of Care Note  Patient: Stanley Nguyen  Procedure(s) Performed: Procedure(s): ENDARTERECTOMY FEMORAL (Right) LOWER EXTREMITY ANGIOGRAM ( FSA STENT PLACEMENT ) (Right)  Patient Location: PACU  Anesthesia Type:General  Level of Consciousness: sedated  Airway & Oxygen Therapy: Patient Spontanous Breathing and Patient connected to face mask oxygen  Post-op Assessment: Report given to RN and Post -op Vital signs reviewed and stable  Post vital signs: Reviewed and stable  Last Vitals:  Vitals:   07/17/17 0855 07/17/17 1219  BP: 128/60 (!) 173/62  Pulse: 74 72  Resp: 16 16  Temp: 36.9 C 37.2 C  SpO2: 98% 98%    Last Pain:  Vitals:   07/17/17 1219  TempSrc: Tympanic  PainSc: 5          Complications: No apparent anesthesia complications

## 2017-07-18 ENCOUNTER — Encounter: Payer: Self-pay | Admitting: Vascular Surgery

## 2017-07-18 LAB — SUSCEPTIBILITY, AER + ANAEROB

## 2017-07-18 LAB — CBC
HEMATOCRIT: 26 % — AB (ref 40.0–52.0)
HEMOGLOBIN: 9 g/dL — AB (ref 13.0–18.0)
MCH: 30.5 pg (ref 26.0–34.0)
MCHC: 34.6 g/dL (ref 32.0–36.0)
MCV: 88.2 fL (ref 80.0–100.0)
Platelets: 180 10*3/uL (ref 150–440)
RBC: 2.95 MIL/uL — ABNORMAL LOW (ref 4.40–5.90)
RDW: 14.4 % (ref 11.5–14.5)
WBC: 17.1 10*3/uL — ABNORMAL HIGH (ref 3.8–10.6)

## 2017-07-18 LAB — BASIC METABOLIC PANEL
Anion gap: 8 (ref 5–15)
BUN: 9 mg/dL (ref 6–20)
CHLORIDE: 99 mmol/L — AB (ref 101–111)
CO2: 24 mmol/L (ref 22–32)
CREATININE: 0.87 mg/dL (ref 0.61–1.24)
Calcium: 7.7 mg/dL — ABNORMAL LOW (ref 8.9–10.3)
GFR calc Af Amer: >60 mL/min (ref >60)
GFR calc non Af Amer: >60 mL/min (ref >60)
GLUCOSE: 104 mg/dL — AB (ref 65–99)
Potassium: 3.5 mmol/L (ref 3.5–5.1)
Sodium: 131 mmol/L — ABNORMAL LOW (ref 135–145)

## 2017-07-18 LAB — SUSCEPTIBILITY RESULT

## 2017-07-18 LAB — TYPE AND SCREEN
ABO/RH(D): O POS
ANTIBODY SCREEN: NEGATIVE

## 2017-07-18 LAB — GLUCOSE, CAPILLARY: Glucose-Capillary: 104 mg/dL — ABNORMAL HIGH (ref 65–99)

## 2017-07-18 MED ORDER — RIVAROXABAN 20 MG PO TABS
20.0000 mg | ORAL_TABLET | Freq: Every day | ORAL | Status: DC
  Administered 2017-07-18 – 2017-07-19 (×2): 20 mg via ORAL
  Filled 2017-07-18 (×2): qty 1

## 2017-07-18 MED ORDER — FLEET ENEMA 7-19 GM/118ML RE ENEM: 1.0000 | ENEMA | Freq: Once | RECTAL | Status: DC

## 2017-07-18 NOTE — Progress Notes (Signed)
Pt transferred to room 148 in stable condition. All belongings with patient.

## 2017-07-18 NOTE — Anesthesia Postprocedure Evaluation (Signed)
Anesthesia Post Note  Patient: Stanley Nguyen  Procedure(s) Performed: Procedure(s) (LRB): ENDARTERECTOMY FEMORAL (Right) LOWER EXTREMITY ANGIOGRAM ( FSA STENT PLACEMENT ) (Right)  Patient location during evaluation: ICU Anesthesia Type: General Level of consciousness: awake and alert Pain management: pain level controlled Vital Signs Assessment: post-procedure vital signs reviewed and stable Respiratory status: spontaneous breathing and nonlabored ventilation Cardiovascular status: stable Postop Assessment: no apparent nausea or vomiting Anesthetic complications: no     Last Vitals:  Vitals:   07/18/17 0500 07/18/17 0600  BP: (!) 152/56 (!) 172/64  Pulse: 80 85  Resp: 20 17  Temp:    SpO2: 93% 95%    Last Pain:  Vitals:   07/18/17 0400  TempSrc: Oral  PainSc:                  Jules Schick

## 2017-07-18 NOTE — Evaluation (Signed)
Physical Therapy Evaluation Patient Details Name: Stanley Nguyen MRN: [id number] DOB: 1946-10-01 Today's Date: 07/18/2017   History of Present Illness   71 y.o. 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  Has had multiple vascular surgeries (angiogram 9/16 and had 2 stents placed in right and left common iliac artery)  Clinical Impression  Pt hesitant to do much regarding PT, but did show ability to to ~11 minutes of supine exercises with LEs and get to EOB with light assist.  He is very hesitant to try standing/transfering to recliner and states that he may be willing to try tomorrow.  He reported severe pain, but was not overly limited with exercises due to this.  Pt feels he will be safe at home, he will need to show considerably more function to consider this.  Recommending rehab on d/c.     Follow Up Recommendations SNF (Pt states that he and his wife will be fine at home...)    Equipment Recommendations       Recommendations for Other Services       Precautions / Restrictions Precautions Precautions: Fall Restrictions RLE Weight Bearing:  (per podiatry right WB for transfer only)      Mobility  Bed Mobility Overal bed mobility: Needs Assistance Bed Mobility: Supine to Sit;Sit to Supine     Supine to sit: Min assist Sit to supine: Min assist   General bed mobility comments: Pt showed good effort getting to sitting EOB and then back into bed.  He did need light assist with LEs (especially to get back up into bed) but ultimately he did not need heavy assist to assume EOB sitting  Transfers                 General transfer comment: Pt feels he is not ready to try transfer to recliner, states he may be ready tomorrow.  Ambulation/Gait                Stairs            Wheelchair  Mobility    Modified Rankin (Stroke Patients Only)       Balance Overall balance assessment: Needs assistance Sitting-balance support: Single extremity supported Sitting balance-Leahy Scale: Fair       Standing balance-Leahy Scale:  (not tested, would likely be very poor)                               Pertinent Vitals/Pain Pain Assessment: 0-10 Pain Score: 8  Pain Location: b/l feet, groins    Home Living Family/patient expects to be discharged to:: Skilled nursing facility Living Arrangements: Spouse/significant other               Additional Comments: Pt reports he does not want to go to rehab, no steps, has DME (rollator, electric w/c)    Prior Function Level of Independence: Needs assistance         Comments: Pt has been very limited for a while now, uses w/c much of the time     Hand Dominance        Extremity/Trunk Assessment   Upper Extremity Assessment Upper Extremity Assessment: Generalized weakness;Overall [facility] for tasks assessed    Lower Extremity Assessment Lower Extremity Assessment: Generalized weakness (grossly 3/5 t/o b/l  LEs, pain with ankle/foot mvt)       Communication   Communication: No difficulties  Cognition Arousal/Alertness: Awake/alert Behavior During Therapy: WFL for tasks assessed/performed Overall Cognitive Status: Within Functional Limits for tasks assessed                                        General Comments      Exercises General Exercises - Lower Extremity Ankle Circles/Pumps: AROM;10 reps Quad Sets: Strengthening;10 reps Gluteal Sets: Strengthening;10 reps Short Arc Quad: Strengthening;5 reps Heel Slides: AROM;5 reps Hip ABduction/ADduction: Strengthening;10 reps Straight Leg Raises: AROM;5 reps   Assessment/Plan    PT Assessment Patient needs continued PT services  PT Problem List Decreased strength;Decreased range of motion;Decreased balance;Decreased mobility;Decreased  knowledge of use of DME;Decreased safety awareness;Pain;Decreased activity tolerance;Decreased knowledge of precautions       PT Treatment Interventions DME instruction;Gait training;Functional mobility training;Therapeutic activities;Therapeutic exercise;Balance training;Patient/family education;Cognitive remediation    PT Goals (Current goals can be found in the Care Plan section)  Acute Rehab PT Goals Patient Stated Goal: go home PT Goal Formulation: With patient Time For Goal Achievement: 08/01/17 Potential to Achieve Goals: Fair    Frequency Min 2X/week   Barriers to discharge        Co-evaluation               AM-PAC PT "6 Clicks" Daily Activity  Outcome Measure Difficulty turning over in bed (including adjusting bedclothes, sheets and blankets)?: Unable Difficulty moving from lying on back to sitting on the side of the bed? : Unable Difficulty sitting down on and standing up from a chair with arms (e.g., wheelchair, bedside commode, etc,.)?: Unable Help needed moving to and from a bed to chair (including a wheelchair)?: Total Help needed walking in hospital room?: Total Help needed climbing 3-5 steps with a railing? : Total 6 Click Score: 6    End of Session   Activity Tolerance: Patient limited by pain;Patient limited by fatigue Patient left: with call bell/phone within reach;with bed alarm set   PT Visit Diagnosis: Muscle weakness (generalized) (M62.81);Difficulty in walking, not elsewhere classified (R26.2)    Time: 8119-1478 PT Time Calculation (min) (ACUTE ONLY): 29 min   Charges:   PT Evaluation $PT Eval Low Complexity: 1 Low PT Treatments $Therapeutic Exercise: 8-22 mins   PT G Codes:   PT G-Codes **NOT FOR INPATIENT CLASS** Functional Assessment Tool Used: AM-PAC 6 Clicks Basic Mobility Functional Limitation: Mobility: Walking and moving around Mobility: Walking and Moving Around Current Status (G9562): At least 80 percent but less than 100  percent impaired, limited or restricted Mobility: Walking and Moving Around Goal Status [phone]): At least 20 percent but less than 40 percent impaired, limited or restricted    Malachi Pro, DPT 07/18/2017, 4:50 PM

## 2017-07-18 NOTE — Progress Notes (Signed)
Daily Progress Note   Subjective  - 1 Day Post-Op  Pt s/p right lower ext revasc.  Doing well. In ICu today  Objective Vitals:   07/18/17 0500 07/18/17 0600 07/18/17 0700 07/18/17 0800  BP: (!) 152/56 (!) 172/64 (!) 156/61 (!) 162/64  Pulse: 80 85 86 88  Resp: Temp:    99.5 F (37.5 C)  TempSrc:    Oral  SpO2: 93% 95% 94% 93%  Weight: 80.5 kg (177 lb 7.5 oz)     Height:        Physical Exam: Dressing to right foot changed.  Wound is improved with good granular tissue forming.  Still area of exposed bone.  No frank purulence  Laboratory CBC    Component Value Date/Time   WBC 8.6 07/17/2017 0455   HGB 9.3 (L) 07/17/2017 0455   HGB 8.1 (L) 08/04/2013 0333   HCT 26.4 (L) 07/17/2017 0455   HCT 24.9 (L) 08/03/2013 0456   PLT 147 (L) 07/17/2017 0455   PLT 142 (L) 08/03/2013 0456    BMET    Component Value Date/Time   NA 132 (L) 07/17/2017 0455   NA 132 (L) 08/04/2013 0333   K 3.7 07/17/2017 0455   K 4.4 08/04/2013 0333   CL 100 (L) 07/17/2017 0455   CL 100 08/04/2013 0333   CO2 27 07/17/2017 0455   CO2 27 08/04/2013 0333   GLUCOSE 95 07/17/2017 0455   GLUCOSE 69 08/04/2013 0333   BUN 11 07/17/2017 0455   BUN 3 (L) 08/04/2013 0333   CREATININE 0.87 07/17/2017 0455   CREATININE 0.87 08/04/2013 0333   CALCIUM 7.9 (L) 07/17/2017 0455   CALCIUM 7.6 (L) 08/04/2013 0333   GFRNONAA >60 07/17/2017 0455   GFRNONAA >60 08/04/2013 0333   GFRAA >60 07/17/2017 0455   GFRAA >60 08/04/2013 0454    Assessment/Planning: Osteomyelitis right foot PVD   Dressing changed.  C/W wound vac   IV abx per ID  F/u with podiatry in 2 weeks.  WB for transfer only on right at this time.  Pt ok for d/c from podiatry standpoint.  Will follow outpt.  Please reconsult prn.  Gwyneth Revels A  07/18/2017, 8:29 AM

## 2017-07-18 NOTE — Progress Notes (Signed)
Wound vac dressing changed, space boots applied per Dr. Earney Mallet order. Pt tolerated well. Pt's leg elevated. Will continue to assess.

## 2017-07-18 NOTE — Progress Notes (Signed)
[facility]  Daily Progress Note   Subjective  - 1 Day Post-Op  Did well overnight.  No events  Objective Vitals:   07/18/17 0600 07/18/17 0700 07/18/17 0800 07/18/17 1020  BP: (!) 172/64 (!) 156/61 (!) 162/64 (!) 139/54  Pulse: 85 86 88   Resp: Temp:   99.5 F (37.5 C)   TempSrc:   Oral   SpO2: 95% 94% 93%   Weight:      Height:        Intake/Output Summary (Last 24 hours) at 07/18/17 1023 Last data filed at 07/18/17 1021  Gross per 24 hour  Intake             2010 ml  Output             1850 ml  Net              160 ml    PULM  CTAB CV  RRR VASC  Foot warm, 1+ DP pulse present  Laboratory CBC    Component Value Date/Time   WBC 17.1 (H) 07/18/2017 0917   HGB 9.0 (L) 07/18/2017 0917   HGB 8.1 (L) 08/04/2013 0333   HCT 26.0 (L) 07/18/2017 0917   HCT 24.9 (L) 08/03/2013 0456   PLT 180 07/18/2017 0917   PLT 142 (L) 08/03/2013 0456    BMET    Component Value Date/Time   NA 131 (L) 07/18/2017 0917   NA 132 (L) 08/04/2013 0333   K 3.5 07/18/2017 0917   K 4.4 08/04/2013 0333   CL 99 (L) 07/18/2017 0917   CL 100 08/04/2013 0333   CO2 24 07/18/2017 0917   CO2 27 08/04/2013 0333   GLUCOSE 104 (H) 07/18/2017 0917   GLUCOSE 69 08/04/2013 0333   BUN 9 07/18/2017 0917   BUN 3 (L) 08/04/2013 0333   CREATININE 0.87 07/18/2017 0917   CREATININE 0.87 08/04/2013 0333   CALCIUM 7.7 (L) 07/18/2017 0917   CALCIUM 7.6 (L) 08/04/2013 0333   GFRNONAA >60 07/18/2017 0917   GFRNONAA >60 08/04/2013 0333   GFRAA >60 07/18/2017 0917   GFRAA >60 08/04/2013 0333    Assessment/Planning: POD #1 s/p right femoral endarterectomy and SFA stent   Doing well  Ok to get out catheter and move to the floor  Weight bearing as per Podiatry.  No restrictions from vascular POV  OK to discharge tomorrow on ASA/Plavix/Statin agent    Festus Barren  07/18/2017, 10:23 AM

## 2017-07-18 NOTE — Progress Notes (Signed)
Pharmacy Antibiotic Note  Stanley Nguyen is a 71 y.o. male admitted on 07/10/2017 with  sepsis secondary to wound infection and possible osteomyelitis.  Pharmacy has been consulted for vancomycin and Zosyn dosing. Patient received Vancomycin 1g IV and Zosyn 3.375 IV x 1 dose in ED.  Vancomycin trough came back supratherapeutic (34 on vancomycin 1 g IV q12h) on 9/14 but antibiotics narrowed to Unasyn.  Plan: Continue Unasyn 3 g IV q6h  Height:  (177.8 cm) Weight: 177 lb 7.5 oz (80.5 kg) IBW/kg (Calculated) : 73  Temp (24hrs), Avg:98.9 F (37.2 C), Min:98.6 F (37 C), Max:99.5 F (37.5 C)   Recent Labs Lab 07/12/17 1234  07/14/17 0551 07/15/17 0515 07/16/17 0600 07/17/17 0455 07/18/17 0917  WBC  --   < > 8.5 7.6 8.8 8.6 17.1*  CREATININE  --   < > 0.95 0.99 1.01 0.87 0.87  VANCOTROUGH 34*  --   --   --   --   --   --   < > = values in this interval not displayed.  Estimated Creatinine Clearance: 81.6 mL/min (by C-G formula based on SCr of 0.87 mg/dL).    Allergies  Allergen Reactions  . Keppra [Levetiracetam] Rash    Antimicrobials this admission: 9/12 vancomycin  >> 9/14 9/12 Zosyn  >> 9/14 Unasyn 9/14>>  Dose adjustments this admission: N/A  Microbiology results: 9/12 BCx: NG final 9/12 Wound Cx: MSSA 9/14 WCX surgical - staph aureus/strep anginosis sensitive to PCN 9/12 MRSA: positive   Thank you for allowing pharmacy to be a part of this patient's care.  Dwain Sarna, PharmD Student 07/18/2017 11:07 AM

## 2017-07-18 NOTE — Progress Notes (Signed)
Pt transfer from ICU. Pt has foley in place, notified MD regarding orders remove/when? Pt also has wound vac to rt foot. Pts VSS. Pain medication given per pt request. AAOx4. Call bell in reach, bed in lowest position.

## 2017-07-18 NOTE — Progress Notes (Signed)
[facility] [place] [facility]   PATIENT NAME: Stanley Nguyen    MR#:  [id number]  DATE OF BIRTH:  1945-11-27  SUBJECTIVE:   Patient doing well this am   REVIEW OF SYSTEMS:    Review of Systems  Constitutional: Negative for fever, chills weight loss HENT: Negative for ear pain, nosebleeds, congestion, facial swelling, rhinorrhea, neck pain, neck stiffness and ear discharge.   Respiratory: Negative for cough, shortness of breath, wheezing  Cardiovascular: Negative for chest pain, palpitations and leg swelling.  Gastrointestinal: Negative for heartburn, abdominal pain, vomiting, diarrhea or consitpation Genitourinary: Negative for dysuria, urgency, frequency, hematuria Musculoskeletal: Negative for back pain or joint pain Neurological: Negative for dizziness, seizures, syncope, focal weakness,  numbness and headaches.  Hematological: Does not bruise/bleed easily.  Psychiatric/Behavioral: Negative for hallucinations, confusion, dysphoric mood    Tolerating Diet: yes     DRUG ALLERGIES:   Allergies  Allergen Reactions  . Keppra [Levetiracetam] Rash    VITALS:  Blood pressure (!) 157/58, pulse 81, temperature 99.5 F (37.5 C), temperature source Oral, resp. rate 18, height  (1.778 m), weight 80.5 kg (177 lb 7.5 oz), SpO2 95 %.  PHYSICAL EXAMINATION:  Constitutional: Appears well-developed and well-nourished. No distress. HENT: Normocephalic. Marland Kitchen Oropharynx is clear and moist.  Eyes: Conjunctivae and EOM are normal. PERRLA, no scleral icterus.  Neck: Normal ROM. Neck supple. No JVD. No tracheal deviation. CVS: RRR, S1/S2 +, no murmurs, no gallops, no carotid bruit.  Pulmonary: Effort and breath sounds normal, no stridor, rhonchi, wheezes, rales.  Abdominal: Soft. BS +,  no distension, tenderness, rebound or guarding.  Musculoskeletal: Normal range of motion. No edema and no tenderness.  Wound VAC placed Neuro: Alert. CN 2-12 grossly intact. No focal  deficits. Skin: Skin is warm and dry. No rash noted. Psychiatric: Normal mood and affect.      LABORATORY PANEL:   CBC  Recent Labs Lab 07/18/17 0917  WBC 17.1*  HGB 9.0*  HCT 26.0*  PLT 180   ------------------------------------------------------------------------------------------------------------------  Chemistries   Recent Labs Lab 07/18/17 0917  NA 131*  K 3.5  CL 99*  CO2 24  GLUCOSE 104*  BUN 9  CREATININE 0.87  CALCIUM 7.7*   ------------------------------------------------------------------------------------------------------------------  Cardiac Enzymes No results for input(s): TROPONINI in the last 168 hours. ------------------------------------------------------------------------------------------------------------------  RADIOLOGY:  Dg C-arm 1-60 Min-no Report  Result Date: 07/17/2017 Fluoroscopy was utilized by the requesting physician.  No radiographic interpretation.     ASSESSMENT AND PLAN:   71 year old male with history of seizure disorder and diabetes who presents with acute osteomyelitis of right first metatarsal.  1.acute osteomyelitis dorsum of right foot status post debridement and hardware removal postoperative day #6 with PAD Patient underwent angiogram 9/16 and had 2 stents placed in right and left common iliac artery Postoperative day #1 right common femoral, superficial femoral and profunda femoris endarterectomy Cultures with MSSA. Patient has PICC line and he will continue Unasyn as per ID consultation. Stop date for antibiotics is October 19. Dressing to right foot with change . Wound is improved with good granular tissue forming as per podiatry.  Continue wound VAC to be change Tuesday, Thursday and Saturday  WB for transfer only on right at this time  2. Hyponatremia: Sodium levels stable   3. Anemia of chronic disease: Hemoglobin remains stable 4. History of chronic atrial fibrillation: Continue sotalol and Restart  Xarelto  5. COPD without exacerbation: Continue inhalers  6. Tobacco dependence: Patient is encouraged to  quit smoking. continue nicotine patch  7. Elevated blood pressure without history of hypertension: continue Norvasc and  lisinopril  Monitor BP/creatinine   8. Seizure Disorder; Continue Vimpat    Management plans discussed with the patient and he is in agreement.  CODE STATUS: full  TOTAL TIME TAKING CARE OF THIS PATIENT: 23 minutes.  D/w dr dew PT and CSW consult placed    POSSIBLE D/C tomorrow DEPENDING ON CLINICAL CONDITION.   Stanley Nguyen M.D on 07/18/2017 at 11:36 AM  Between 7am to 6pm - Pager - [phone] After 6pm go to [email or web address] - password [employer]  [facility]  Office  [phone]  CC: Primary care physician; Stanley Monday, MD  Note: This dictation was prepared with Dragon dictation along with smaller phrase technology. Any transcriptional errors that result from this process are unintentional.

## 2017-07-19 LAB — BASIC METABOLIC PANEL
Anion gap: 7 (ref 5–15)
BUN: 10 mg/dL (ref 6–20)
CHLORIDE: 98 mmol/L — AB (ref 101–111)
CO2: 25 mmol/L (ref 22–32)
CREATININE: 0.95 mg/dL (ref 0.61–1.24)
Calcium: 7.9 mg/dL — ABNORMAL LOW (ref 8.9–10.3)
GFR calc Af Amer: >60 mL/min (ref >60)
GFR calc non Af Amer: >60 mL/min (ref >60)
Glucose, Bld: 98 mg/dL (ref 65–99)
POTASSIUM: 3.3 mmol/L — AB (ref 3.5–5.1)
Sodium: 130 mmol/L — ABNORMAL LOW (ref 135–145)

## 2017-07-19 LAB — CBC
HEMATOCRIT: 26.4 % — AB (ref 40.0–52.0)
Hemoglobin: 9.2 g/dL — ABNORMAL LOW (ref 13.0–18.0)
MCH: 31.1 pg (ref 26.0–34.0)
MCHC: 34.9 g/dL (ref 32.0–36.0)
MCV: 89.2 fL (ref 80.0–100.0)
Platelets: 188 10*3/uL (ref 150–440)
RBC: 2.96 MIL/uL — ABNORMAL LOW (ref 4.40–5.90)
RDW: 14.5 % (ref 11.5–14.5)
WBC: 17.1 10*3/uL — ABNORMAL HIGH (ref 3.8–10.6)

## 2017-07-19 LAB — SURGICAL PATHOLOGY

## 2017-07-19 LAB — GLUCOSE, CAPILLARY: GLUCOSE-CAPILLARY: 96 mg/dL (ref 65–99)

## 2017-07-19 MED ORDER — ZOLPIDEM TARTRATE 5 MG PO TABS: 5.0000 mg | ORAL_TABLET | Freq: Every evening | ORAL | 0 refills | Status: DC | PRN

## 2017-07-19 MED ORDER — OXYCONTIN 30 MG PO T12A: 1.0000 | EXTENDED_RELEASE_TABLET | Freq: Three times a day (TID) | ORAL | 0 refills | Status: DC

## 2017-07-19 MED ORDER — POLYETHYLENE GLYCOL 3350 17 G PO PACK: 17.0000 g | PACK | Freq: Every day | ORAL | 0 refills | Status: DC

## 2017-07-19 MED ORDER — SODIUM CHLORIDE 0.9 % IV SOLN: 3.0000 g | Freq: Four times a day (QID) | INTRAVENOUS | 0 refills | Status: AC

## 2017-07-19 MED ORDER — AMLODIPINE BESYLATE 10 MG PO TABS: 10.0000 mg | ORAL_TABLET | Freq: Every day | ORAL | 0 refills | Status: DC

## 2017-07-19 MED ORDER — POTASSIUM CHLORIDE CRYS ER 20 MEQ PO TBCR
40.0000 meq | EXTENDED_RELEASE_TABLET | Freq: Once | ORAL | Status: AC
  Administered 2017-07-19: 40 meq via ORAL
  Filled 2017-07-19: qty 2

## 2017-07-19 MED ORDER — LISINOPRIL 10 MG PO TABS: 10.0000 mg | ORAL_TABLET | Freq: Every day | ORAL | 0 refills | Status: DC

## 2017-07-19 MED ORDER — NICOTINE 21 MG/24HR TD PT24: 21.0000 mg | MEDICATED_PATCH | Freq: Every day | TRANSDERMAL | 0 refills | Status: DC

## 2017-07-19 NOTE — Clinical Social Work Note (Signed)
Clinical Social Work Assessment  Patient Details  Name: Stanley Nguyen MRN: [id number] Date of Birth: 11-Nov-1945  Date of referral:  07/19/17               Reason for consult:  Facility Placement                Permission sought to share information with:  Family Supports, Magazine features editor Permission granted to share information::  Yes, Verbal Permission Granted  Name::     Stanley Nguyen Spouse [phone]  [phone]   Agency::  SNF admissions  Relationship::     Contact Information:     Housing/Transportation Living arrangements for the past 2 months:  Single Family Home Source of Information:  Patient, Spouse Patient Interpreter Needed:  None Criminal Activity/Legal Involvement Pertinent to Current Situation/Hospitalization:  No - Comment as needed Significant Relationships:  Spouse Lives with:  Spouse Do you feel safe going back to the place where you live?  No Need for family participation in patient care:  No (Coment)  Care giving concerns:  Patient and his wife feel he needs some short term rehab before he is able to return back home.  Patient states he has not been to rehab before, CSW explained to patient what the role of CSW is and the process for looking for SNF placement.  Patient was also explained how insurance will pay for his stay and what to expect at SNF.  Patient was informed that insurance will make decision if he is approved or not.  CSW also spoke with patient's wife and explained how SNF placement works and how insurance will cover his stay.  Patient and wife gave CSW permission to begin bed search in [place].  Patient and his wife did not have any other questions or concerns.   Social Worker assessment / plan:  Patient and family agreeable for going to SNF for short term rehab.  Employment status:  Retired Database administrator PT Recommendations:  Skilled Nursing Facility Information / Referral to community resources:   Skilled Nursing Facility  Patient/Family's Response to care:  Patient and family agreeable to going to SNF for short term rehab.  Patient/Family's Understanding of and Emotional Response to Diagnosis, Current Treatment, and Prognosis: Patient and his wife are hopeful that patient will not have to be in SNF for very long.  Emotional Assessment Appearance:  Appears stated age Attitude/Demeanor/Rapport:    Affect (typically observed):  Calm, Appropriate Orientation:  Oriented to Self, Oriented to Place, Oriented to  Time, Oriented to Situation Alcohol / Substance use:  Tobacco Use Psych involvement (Current and /or in the community):  No (Comment)  Discharge Needs  Concerns to be addressed:  Lack of Support Readmission within the last 30 days:  No Current discharge risk:  Lack of support system Barriers to Discharge:  No Barriers Identified   [place] Constable 07/19/2017, 6:31 PM

## 2017-07-19 NOTE — Discharge Summary (Signed)
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 Atrial fibrillation (HCC)   . CAD (coronary artery disease)   . COPD (chronic obstructive pulmonary disease) (HCC)   . DM (diabetes mellitus) (HCC)    diet controlled  . Liver cirrhosis (HCC)   . Liver cirrhosis (HCC)   . Seizure disorder [facility])     HOSPITAL COURSE:    71 year old male with history of seizure disorder and diabetes who presents with acute osteomyelitis of right first metatarsal.  1.acute osteomyelitis dorsum of right foot status post debridement and hardware removal postoperative day #7 with PAD Patient underwent angiogram 9/16 and had 2 stents placed in right and left common iliac artery Postoperative day #2 right common femoral, superficial femoral and profunda femoris endarterectomy Cultures growing MSSA. Patient has PICC line and he will continue Unasyn as per ID consultation. Stop date for antibiotics is October 19. Dressing to right foot was changed by Podiatry. Wound is improved with good granular tissue forming as per podiatry.  Continue wound VAC to be change Tuesday, Thursday and Saturday  WB for transfer only on right at this time He will follow-up with Dr. Ether Griffins in 2-3 weeks. 2. Hyponatremia: Sodium levels stable   3. Anemia of chronic disease: Hemoglobin has remained stable 4. History of chronic atrial fibrillation: He will continue sotalol and Restart Xarelto  5. COPD without exacerbation: Continue  inhalers  6. Tobacco dependence: Patient is encouraged to quit smoking. He will continue nicotine patch  7. Elevated blood pressure without history of hypertension: continue Norvasc and  lisinopril   8. Seizure Disorder; Continue Vimpat 9. Diet controlled Diabetes  DISCHARGE CONDITIONS AND DIET:  stable Diabetic heart healthy   CONSULTS OBTAINED:  Treatment Team:  Mick Sell, MD  DRUG ALLERGIES:   Allergies  Allergen Reactions  . Keppra [Levetiracetam] Rash    DISCHARGE MEDICATIONS:   Current Discharge Medication List    START taking these medications   Details  amLODipine (NORVASC) 10 MG tablet Take 1 tablet (10 mg total) by mouth daily. Qty: 30 tablet, Refills: 0    Ampicillin-Sulbactam 3 g in sodium chloride 0.9 % 100 mL Inject 3 g into the vein every 6 (six) hours. Qty: 360 g, Refills: 0    lisinopril (PRINIVIL,ZESTRIL) 10 MG tablet Take 1 tablet (10 mg total) by mouth daily. Qty: 30 tablet, Refills: 0    nicotine (NICODERM CQ - DOSED IN MG/24 HOURS) 21 mg/24hr patch Place 1 patch (21 mg total) onto the skin daily. Qty: 28 patch, Refills: 0    polyethylene glycol (MIRALAX / GLYCOLAX) packet Take 17 g by mouth daily. Qty: 14 each, Refills: 0      CONTINUE these medications which have CHANGED   Details  OXYCONTIN 30 MG 12 hr tablet Take 1 tablet by mouth every 8 (eight) hours. Qty: 14 each, Refills: 0    zolpidem (AMBIEN) 5 MG tablet Take 1 tablet (5 mg total) by mouth at bedtime as needed for sleep. Qty:  30 tablet, Refills: 0      CONTINUE these medications which have NOT CHANGED   Details  busPIRone (BUSPAR) 7.5 MG tablet Take 7.5 mg by mouth 2 (two) times daily.    gabapentin (NEURONTIN) 300 MG capsule Take 300 mg by mouth 3 (three) times daily.    sotalol (BETAPACE) 80 MG tablet Take 80 mg by mouth daily.    SPIRIVA HANDIHALER 18 MCG inhalation capsule Place 1 capsule into inhaler and inhale daily. Refills: 1    VIMPAT 50 MG TABS  tablet Take 50 mg by mouth 2 (two) times daily. Refills: 2    XARELTO 20 MG TABS tablet Take 20 mg by mouth daily. Refills: 2      STOP taking these medications     celecoxib (CELEBREX) 200 MG capsule           Today   CHIEF COMPLAINT:  Patient Without any complaints today. Doing well.   VITAL SIGNS:  Blood pressure (!) 152/45, pulse 73, temperature 98.5 F (36.9 C), temperature source Oral, resp. rate 18, height  (1.778 m), weight 75.4 kg (166 lb 3.2 oz), SpO2 94 %.   REVIEW OF SYSTEMS:  Review of Systems  Constitutional: Negative.  Negative for chills, fever and malaise/fatigue.  HENT: Negative.  Negative for ear discharge, ear pain, hearing loss, nosebleeds and sore throat.   Eyes: Negative.  Negative for blurred vision and pain.  Respiratory: Negative.  Negative for cough, hemoptysis, shortness of breath and wheezing.   Cardiovascular: Negative.  Negative for chest pain, palpitations and leg swelling.  Gastrointestinal: Negative.  Negative for abdominal pain, blood in stool, diarrhea, nausea and vomiting.  Genitourinary: Negative.  Negative for dysuria.  Musculoskeletal: Negative.  Negative for back pain.  Skin: Negative.   Neurological: Negative for dizziness, tremors, speech change, focal weakness, seizures and headaches.  Endo/Heme/Allergies: Negative.  Does not bruise/bleed easily.  Psychiatric/Behavioral: Negative.  Negative for depression, hallucinations and suicidal ideas.     PHYSICAL EXAMINATION:  GENERAL:  71 y.o.-year-old patient lying in the bed with no acute distress.  NECK:  Supple, no jugular venous distention. No thyroid enlargement, no tenderness.  LUNGS: Normal breath sounds bilaterally, no wheezing, rales,rhonchi  No use of accessory muscles of respiration.  CARDIOVASCULAR: S1, S2 normal. No murmurs, rubs, or gallops.  ABDOMEN: Soft, non-tender, non-distended. Bowel sounds present. No organomegaly or mass.  EXTREMITIES: No pedal edema,  cyanosis, or clubbing.  PSYCHIATRIC: The patient is alert and oriented x 3.  SKIN: wound vac placed PICC line placed  DATA REVIEW:   CBC  Recent Labs Lab 07/19/17 0449  WBC 17.1*  HGB 9.2*  HCT 26.4*  PLT 188    Chemistries   Recent Labs Lab 07/19/17 0449  NA 130*  K 3.3*  CL 98*  CO2 25  GLUCOSE 98  BUN 10  CREATININE 0.95  CALCIUM 7.9*    Cardiac Enzymes No results for input(s): TROPONINI in the last 168 hours.  Microbiology Results  @  RADIOLOGY:  Dg C-arm 1-60 Min-no Report  Result Date: 07/17/2017 Fluoroscopy was utilized by the requesting physician.  No radiographic interpretation.      Current Discharge Medication List    START taking these medications   Details  amLODipine (NORVASC) 10 MG tablet Take 1 tablet (10 mg total) by mouth daily. Qty: 30 tablet, Refills: 0    Ampicillin-Sulbactam 3 g in sodium chloride 0.9 % 100 mL Inject 3 g into the vein every 6 (six) hours. Qty:  360 g, Refills: 0    lisinopril (PRINIVIL,ZESTRIL) 10 MG tablet Take 1 tablet (10 mg total) by mouth daily. Qty: 30 tablet, Refills: 0    nicotine (NICODERM CQ - DOSED IN MG/24 HOURS) 21 mg/24hr patch Place 1 patch (21 mg total) onto the skin daily. Qty: 28 patch, Refills: 0    polyethylene glycol (MIRALAX / GLYCOLAX) packet Take 17 g by mouth daily. Qty: 14 each, Refills: 0      CONTINUE these medications which have CHANGED   Details  OXYCONTIN 30 MG 12 hr tablet Take 1 tablet by mouth every 8 (eight) hours. Qty: 14 each, Refills: 0    zolpidem (AMBIEN) 5 MG tablet Take 1 tablet (5 mg total) by mouth at bedtime as needed for sleep. Qty: 30 tablet, Refills: 0      CONTINUE these medications which have NOT CHANGED   Details  busPIRone (BUSPAR) 7.5 MG tablet Take 7.5 mg by mouth 2 (two) times daily.    gabapentin (NEURONTIN) 300 MG capsule Take 300 mg by mouth 3 (three) times daily.    sotalol (BETAPACE) 80 MG tablet Take 80 mg by mouth daily.     SPIRIVA HANDIHALER 18 MCG inhalation capsule Place 1 capsule into inhaler and inhale daily. Refills: 1    VIMPAT 50 MG TABS tablet Take 50 mg by mouth 2 (two) times daily. Refills: 2    XARELTO 20 MG TABS tablet Take 20 mg by mouth daily. Refills: 2      STOP taking these medications     celecoxib (CELEBREX) 200 MG capsule            Management plans discussed with the patient and he is in agreement. Stable for discharge   Patient should follow up with pcp. Dr dew, dr fitzgerald and dr Ether Griffins  CODE STATUS:     Code Status Orders        Start     Ordered   07/10/17 1840  Full code  Continuous     07/10/17 1842    Code Status History    Date Active Date Inactive Code Status Order ID Comments User Context   This patient has a current code status but no historical code status.      TOTAL TIME TAKING CARE OF THIS PATIENT: 39 minutes.    Note: This dictation was prepared with Dragon dictation along with smaller phrase technology. Any transcriptional errors that result from this process are unintentional.  Danique Hartsough M.D on 07/19/2017 at 8:10 AM  Between 7am to 6pm - Pager - [phone] After 6pm go to [email or web address] - password [email or web address]  [facility]  Office  [phone]  CC: Primary care physician; [name], MD

## 2017-07-19 NOTE — Progress Notes (Signed)
Pharmacy Antibiotic Note  Stanley Nguyen is a 71 y.o. male admitted on 07/10/2017 with  sepsis secondary to wound infection and possible osteomyelitis.  Pharmacy has been consulted for vancomycin and Zosyn dosing. Patient received Vancomycin 1g IV and Zosyn 3.375 IV x 1 dose in ED.  Vancomycin trough came back supratherapeutic (34 on vancomycin 1 g IV q12h) on 9/14 but antibiotics narrowed to Unasyn.  Plan: Day 10 IV abx therapy. Continue Unasyn 3 g IV q6h  Height:  (177.8 cm) Weight: 166 lb 3.2 oz (75.4 kg) IBW/kg (Calculated) : 73  Temp (24hrs), Avg:98.4 F (36.9 C), Min:98 F (36.7 C), Max:98.9 F (37.2 C)   Recent Labs Lab 07/12/17 1234  07/15/17 0515 07/16/17 0600 07/17/17 0455 07/18/17 0917 07/19/17 0449  WBC  --   < > 7.6 8.8 8.6 17.1* 17.1*  CREATININE  --   < > 0.99 1.01 0.87 0.87 0.95  VANCOTROUGH 34*  --   --   --   --   --   --   < > = values in this interval not displayed.  Estimated Creatinine Clearance: 74.7 mL/min (by C-G formula based on SCr of 0.95 mg/dL).    Allergies  Allergen Reactions  . Keppra [Levetiracetam] Rash    Antimicrobials this admission: 9/12 vancomycin  >> 9/14 9/12 Zosyn  >> 9/14 Unasyn 9/14>>  Dose adjustments this admission: N/A  Microbiology results: 9/12 BCx: NG final 9/12 Wound Cx: MSSA 9/14 WCX surgical - staph aureus/strep anginosis sensitive to PCN 9/12 MRSA: positive   Thank you for allowing pharmacy to be a part of this patient's care.  GaLATIF NAZARENOndle, PharmD, BCPS Clinical Pharmacist 07/19/2017 10:21 AM

## 2017-07-19 NOTE — Evaluation (Signed)
Occupational Therapy Evaluation Patient Details Name: Stanley Nguyen MRN: [id number] DOB: [birth year] Today's Date: 07/19/2017    History of Present Illness Pt. is a 71 y.o. male who was admitted to [facility] with osteomylitis of the right great Metatarsal. Pt. has had multiple vascular surgeries including: and angiogram, and 2 stents placed in the left common iliac artery. Pt. PMHX includes: A-Fib, seizure disorder, and anxiety.   Clinical Impression   Pt. Is a 71 y.o. Male who was admitted to [facility] with osteomylitis of the right metatarsal. Pt. Resides at home with his wife. Pt. Resides with his wife. Pt. Was able to perform basic ADL care. Pt. Wife assists pt. As needed, and performs the home management, cooking, meal preparation, and medication management. Pt. Presents with weakness, pain, and limited functional mobility. Pt. Could benefit from skilled OT services for ADL training, A/E training, UE there. Ex, and pt. Education about home modification, and DME. Pt. Could benefit from SNF level of care upon discharge, with follow-up OT services.    Follow Up Recommendations  SNF    Equipment Recommendations       Recommendations for Other Services       Precautions / Restrictions Restrictions Weight Bearing Restrictions: Yes RLE Weight Bearing: Non weight bearing                                                    ADL either performed or assessed with clinical judgement   ADL Overall ADL's : Needs assistance/impaired Eating/Feeding: Set up   Grooming: Set up   Upper Body Bathing: Minimal assistance   Lower Body Bathing: Maximal assistance   Upper Body Dressing : Minimal assistance   Lower Body Dressing: Maximal assistance               Functional mobility during ADLs:  (Pt. was seen for a bedside eval.) General ADL Comments: Pt. education was provided about A/E use for LE ADLs.     Vision Baseline Vision/History: Cataracts Patient Visual Report: No  change from baseline       Perception     Praxis      Pertinent Vitals/Pain Pain Assessment: 0-10 Pain Score: 8      Hand Dominance Right   Extremity/Trunk Assessment Upper Extremity Assessment Upper Extremity Assessment: Generalized weakness;Overall WFL for tasks assessed           Communication Communication Communication: No difficulties   Cognition Arousal/Alertness: Awake/alert Behavior During Therapy: WFL for tasks assessed/performed Overall Cognitive Status: Within Functional Limits for tasks assessed                                     General Comments       Exercises     Shoulder Instructions      Home Living Family/patient expects to be discharged to:: Skilled nursing facility Living Arrangements: Spouse/significant other                                      Prior Functioning/Environment Level of Independence: Needs assistance       Pt. was able to complete basic self-care tasks. Pt. Wife assist pt. As needed, and performs the cooking,  meal preparation, and medication management.          OT Problem List: Decreased strength;Decreased activity tolerance;Decreased knowledge of use of DME or AE;Decreased cognition;Decreased coordination;Pain      OT Treatment/Interventions: Self-care/ADL training;Therapeutic exercise;Patient/family education;Therapeutic activities;DME and/or AE instruction;Energy conservation    OT Goals(Current goals can be found in the care plan section) Acute Rehab OT Goals Patient Stated Goal: To return home OT Goal Formulation: With patient Potential to Achieve Goals: Good  OT Frequency: Min 2X/week   Barriers to D/C:            Co-evaluation              AM-PAC PT "6 Clicks" Daily Activity     Outcome Measure Help from another person eating meals?: None Help from another person taking care of personal grooming?: None Help from another person toileting, which includes using  toliet, bedpan, or urinal?: A Lot Help from another person bathing (including washing, rinsing, drying)?: A Lot Help from another person to put on and taking off regular upper body clothing?: A Little Help from another person to put on and taking off regular lower body clothing?: A Lot 6 Click Score: 17   End of Session    Activity Tolerance: Patient tolerated treatment well Patient left: in bed;with call bell/phone within reach;with bed alarm set  OT Visit Diagnosis: Muscle weakness (generalized) (M62.81)                Time: 1341-1405 OT Time Calculation (min): 24 min Charges:  OT General Charges $OT Visit: 1 Visit OT Evaluation $OT Eval Moderate Complexity: 1 Mod G-Codes: OT G-codes **NOT FOR INPATIENT CLASS** Functional Limitation: Self care Self Care Current Status (W0981): At least 40 percent but less than 60 percent impaired, limited or restricted Self Care Goal Status (X9147): At least 1 percent but less than 20 percent impaired, limited or restricted   Olegario Messier, MS, OTR/L   Olegario Messier, MS, OTR/L 07/19/2017, 2:35 PM

## 2017-07-19 NOTE — Clinical Social Work Placement (Signed)
   CLINICAL SOCIAL WORK PLACEMENT  NOTE  Date:  07/19/2017  Patient Details  Name: Stanley Nguyen MRN: [id number] Date of Birth: 1946-04-01  Clinical Social Work is seeking post-discharge placement for this patient at the Skilled  Nursing Facility level of care (*CSW will initial, date and re-position this form in  chart as items are completed):  Yes   Patient/family provided with [place] Clinical Social Work Department's list of facilities offering this level of care within the geographic area requested by the patient (or if unable, by the patient's family).  Yes   Patient/family informed of their freedom to choose among providers that offer the needed level of care, that participate in Medicare, Medicaid or managed care program needed by the patient, have an available bed and are willing to accept the patient.  Yes   Patient/family informed of 's ownership interest in [facility] and [facility], as well as of the fact that they are under no obligation to receive care at these facilities.  PASRR submitted to EDS on 07/19/17     PASRR number received on 07/19/17     Existing PASRR number confirmed on       FL2 transmitted to all facilities in geographic area requested by pt/family on 07/19/17     FL2 transmitted to all facilities within larger geographic area on       Patient informed that his/her managed care company has contracts with or will negotiate with certain facilities, including the following:        Yes   Patient/family informed of bed offers received.  Patient chooses bed at [facility]     Physician recommends and patient chooses bed at      Patient to be transferred to [facility] on 07/19/17.  Patient to be transferred to facility by [facility]     Patient family notified on 07/19/17 of transfer.  Name of family member notified:  Diamond Nickel patient's wife.     PHYSICIAN Please sign FL2      Additional Comment:    _______________________________________________ Darleene Cleaver, LCSWA 07/19/2017, 6:59 PM

## 2017-07-19 NOTE — Clinical Social Work Note (Addendum)
CSW presented bed offers to patient's wife and she chose [facility] SNF.  Awaiting insurance authorization.   2:30pm  CSW received insurance authorization for patient to go to SNF, Auth reference number is [id number].  Patient to be d/c'ed today to [facility].  Patient and family agreeable to plans will transport via ems RN to call report [phone].  Ervin Knack. Hassan Rowan, MSW, Theresia Majors [phone]  07/19/2017 12:39 PM

## 2017-07-19 NOTE — [place] FL2 (Addendum)
[place] MEDICAID FL2 LEVEL OF CARE SCREENING TOOL     IDENTIFICATION  Patient Name: Stanley Nguyen Birthdate: April 25, 1946 Sex: male Admission Date (Current Location): 07/10/2017  [facility] and [place] Number:  Chiropodist and Address:  [facility], [address] [place], [place] [address]      Provider Number: [id number]  Attending Physician Name and Address:  Adrian Saran, MD  Relative Name and Phone Number:  Gaus,[name] Spouse [phone]  [phone]     Current Level of Care: Hospital Recommended Level of Care: Skilled Nursing Facility Prior Approval Number:    Date Approved/Denied:   PASRR Number: [id number]  Discharge Plan: SNF    Current Diagnoses: Patient Active Problem List   Diagnosis Date Noted  . Acute osteomyelitis of metatarsal bone of right foot (HCC) 07/10/2017    Orientation RESPIRATION BLADDER Height & Weight     Self, Time, Situation, Place  Normal Continent Weight: 166 lb 3.2 oz (75.4 kg) Height:   (177.8 cm)  BEHAVIORAL SYMPTOMS/MOOD NEUROLOGICAL BOWEL NUTRITION STATUS    Convulsions/Seizures Continent Diet (Full Liquid diet)  AMBULATORY STATUS COMMUNICATION OF NEEDS Skin   Limited Assist Verbally Surgical wounds                       Personal Care Assistance Level of Assistance  Bathing, Feeding, Dressing Bathing Assistance: Limited assistance Feeding assistance: Independent Dressing Assistance: Limited assistance     Functional Limitations Info  Sight, Hearing, Speech Sight Info: Adequate Hearing Info: Adequate Speech Info: Adequate    SPECIAL CARE FACTORS FREQUENCY  PT (By licensed PT), OT (By licensed OT)     PT Frequency: 5x a week OT Frequency: 5x a week            Contractures Contractures Info: Not present    Additional Factors Info  Code Status, Allergies, Psychotropic Code Status Info: Full Code Allergies Info: KEPPRA LEVETIRACETAM  Psychotropic Info: busPIRone  (BUSPAR) tablet 7.5 mg or zolpidem (AMBIEN) tablet 5 mg         Current Medications (07/19/2017):  This is the current hospital active medication list Current Facility-Administered Medications  Medication Dose Route Frequency Provider Last Rate Last Dose  . acetaminophen (TYLENOL) tablet 650 mg  650 mg Oral Q6H PRN Katha Hamming, MD       Or  . acetaminophen (TYLENOL) suppository 650 mg  650 mg Rectal Q6H PRN Katha Hamming, MD      . amLODipine (NORVASC) tablet 10 mg  10 mg Oral Daily Adrian Saran, MD   10 mg at 07/19/17 0906  . Ampicillin-Sulbactam (UNASYN) 3 g in sodium chloride 0.9 % 100 mL IVPB  3 g Intravenous Q6H Mick Sell, MD 200 mL/hr at 07/19/17 0908 3 g at 07/19/17 0908  . busPIRone (BUSPAR) tablet 7.5 mg  7.5 mg Oral BID Katha Hamming, MD   7.5 mg at 07/19/17 0907  . gabapentin (NEURONTIN) capsule 300 mg  300 mg Oral TID Katha Hamming, MD   300 mg at 07/19/17 0906  . hydrALAZINE (APRESOLINE) injection 10 mg  10 mg Intravenous Q6H PRN Auburn Bilberry, MD   10 mg at 07/14/17 0840  . lacosamide (VIMPAT) tablet 50 mg  50 mg Oral BID Katha Hamming, MD   50 mg at 07/19/17 0913  . lisinopril (PRINIVIL,ZESTRIL) tablet 10 mg  10 mg Oral Daily Adrian Saran, MD   10 mg at 07/19/17 0906  . morphine 2 MG/ML injection 2 mg  2 mg Intravenous Q4H PRN Katha Hamming, MD   2 mg at 07/18/17 0824  . nicotine (NICODERM CQ - dosed in mg/24 hours) patch 21 mg  21 mg Transdermal Daily Gwyneth Revels, DPM   21 mg at 07/19/17 0913  . ondansetron (ZOFRAN) tablet 4 mg  4 mg Oral Q6H PRN Katha Hamming, MD       Or  . ondansetron (ZOFRAN) injection 4 mg  4 mg Intravenous Q6H PRN Katha Hamming, MD      . oxyCODONE (OXYCONTIN) 12 hr tablet 30 mg  30 mg Oral Q8H Katha Hamming, MD   30 mg at 07/19/17 0610  . oxyCODONE-acetaminophen (PERCOCET/ROXICET) 5-325 MG per tablet 1-2 tablet  1-2 tablet Oral Q4H PRN Gwyneth Revels, DPM   2 tablet at 07/19/17  0906  . polyethylene glycol (MIRALAX / GLYCOLAX) packet 17 g  17 g Oral Daily Auburn Bilberry, MD   17 g at 07/19/17 0913  . rivaroxaban (XARELTO) tablet 20 mg  20 mg Oral Daily Adrian Saran, MD   20 mg at 07/19/17 0913  . senna-docusate (Senokot-S) tablet 1 tablet  1 tablet Oral BID Adrian Saran, MD   1 tablet at 07/19/17 0906  . sodium chloride flush (NS) 0.9 % injection 10-40 mL  10-40 mL Intracatheter Q12H Auburn Bilberry, MD   10 mL at 07/19/17 0912  . sodium chloride flush (NS) 0.9 % injection 10-40 mL  10-40 mL Intracatheter PRN Auburn Bilberry, MD   10 mL at 07/18/17 2203  . sodium phosphate (FLEET) 7-19 GM/118ML enema 1 enema  1 enema Rectal Once Mody, Sital, MD      . sotalol (BETAPACE) tablet 80 mg  80 mg Oral Daily Katha Hamming, MD   80 mg at 07/19/17 0907  . tiotropium (SPIRIVA) inhalation capsule 18 mcg  1 capsule Inhalation Daily Katha Hamming, MD   18 mcg at 07/19/17 0912  . zolpidem (AMBIEN) tablet 5 mg  5 mg Oral QHS Katha Hamming, MD   5 mg at 07/18/17 2201     Discharge Medications: Please see discharge summary for a list of discharge medications.  Relevant Imaging Results:  Relevant Lab Results:   Additional Information SSN [id number]  Patient has a wound vac  Liandra Mendia, Ervin Knack, [address]

## 2017-07-19 NOTE — Progress Notes (Signed)
Three attempts to call report to [facility], was placed on hold  and held for over 30 min with no answer from RN. Transportation will be called and pt sent with no verbal report, D/C summary will be sent with pt. Will attempt agin, but will not delay d/c if no answer.

## 2017-07-23 DIAGNOSIS — G8929 Other chronic pain: Secondary | ICD-10-CM | POA: Diagnosis present

## 2017-07-26 ENCOUNTER — Encounter (INDEPENDENT_AMBULATORY_CARE_PROVIDER_SITE_OTHER): Payer: Self-pay | Admitting: Vascular Surgery

## 2017-07-26 ENCOUNTER — Ambulatory Visit (INDEPENDENT_AMBULATORY_CARE_PROVIDER_SITE_OTHER): Payer: Medicare HMO | Admitting: Vascular Surgery

## 2017-07-26 VITALS — BP 146/69 | HR 64 | Resp 16

## 2017-07-26 DIAGNOSIS — M86171 Other acute osteomyelitis, right ankle and foot: Secondary | ICD-10-CM

## 2017-07-26 DIAGNOSIS — I739 Peripheral vascular disease, unspecified: Secondary | ICD-10-CM | POA: Insufficient documentation

## 2017-07-26 DIAGNOSIS — F17200 Nicotine dependence, unspecified, uncomplicated: Secondary | ICD-10-CM

## 2017-07-26 NOTE — Progress Notes (Signed)
Subjective:    Patient ID: Stanley Nguyen, male    DOB: 12/19/1945, 71 y.o.   MRN: [id number] Chief Complaint  Patient presents with  . Follow-up    1 week [facility] f/u   She presents for his first postoperative follow-up. He is status post a right femoral endarterectomy on 07/17/2017. Patient is without complaint with the exception of worsening left lower extremity pain. Patient continues to receive wound care from his podiatrist. There is a VAC on the right lower extremity. As per the patient, he states his right foot wound is healing well. Patient is complaining of left lower extremity pain with activity. He denies any rest pain or ulceration to the lower extremity. He denies any fever, nausea or vomiting. Asking for more narcotic pain medicine for pain.   Review of Systems  Constitutional: Negative.   HENT: Negative.   Eyes: Negative.   Respiratory: Negative.   Cardiovascular:       Left lower extremity pain  Gastrointestinal: Negative.   Endocrine: Negative.   Genitourinary: Negative.   Musculoskeletal: Negative.   Skin: Negative.   Allergic/Immunologic: Negative.   Neurological: Negative.   Hematological: Negative.   Psychiatric/Behavioral: Negative.       Objective:   Physical Exam  Constitutional: He is oriented to person, place, and time. He appears well-developed and well-nourished. No distress.  HENT:  Head: Normocephalic and atraumatic.  Eyes: Pupils are equal, round, and reactive to light. Conjunctivae are normal.  Neck: Normal range of motion.  Cardiovascular: Normal rate, regular rhythm and normal heart sounds.   Pulses:      Radial pulses are 2+ on the right side, and 2+ on the left side.  VAC to right lower extremity. Intact good seal. Unable to palpate pedal pulses to the left lower extremity.   Pulmonary/Chest: Effort normal.  Musculoskeletal: Normal range of motion. He exhibits no edema.  Neurological: He is alert and oriented to person, place, and time.    Skin: He is not diaphoretic.     Right and arterectomy site is healing well  Psychiatric: He has a normal mood and affect. His behavior is normal. Judgment and thought content normal.  Vitals reviewed.  BP (!) 146/69 (BP Location: Left Arm)   Pulse 64   Resp 16   Past Medical History:  Diagnosis Date  . Atrial fibrillation (HCC)   . CAD (coronary artery disease)   . COPD (chronic obstructive pulmonary disease) (HCC)   . DM (diabetes mellitus) (HCC)    diet controlled  . Liver cirrhosis (HCC)   . Liver cirrhosis (HCC)   . Seizure disorder [facility])     Social History   Social History  . Marital status: Married    Spouse name: N/A  . Number of children: N/A  . Years of education: N/A   Occupational History  . Not on file.   Social History Main Topics  . Smoking status: Current Every Day Smoker    Packs/day: 1.00    Types: Cigarettes  . Smokeless tobacco: Never Used  . Alcohol use No  . Drug use: No  . Sexual activity: Not on file   Other Topics Concern  . Not on file   Social History Narrative  . No narrative on file    Past Surgical History:  Procedure Laterality Date  . ENDARTERECTOMY FEMORAL Right 07/17/2017   Procedure: ENDARTERECTOMY FEMORAL;  Surgeon: Annice Needy, MD;  Location: [facility] ORS;  Service: Vascular;  Laterality: Right;  .  IRRIGATION AND DEBRIDEMENT FOOT Right 07/12/2017   Procedure: IRRIGATION AND DEBRIDEMENT FOOT;  Surgeon: Gwyneth Revels, DPM;  Location: [facility] ORS;  Service: Podiatry;  Laterality: Right;  . LOWER EXTREMITY ANGIOGRAM Right 07/17/2017   Procedure: LOWER EXTREMITY ANGIOGRAM ( FSA STENT PLACEMENT );  Surgeon: Annice Needy, MD;  Location: [facility] ORS;  Service: Vascular;  Laterality: Right;  . LOWER EXTREMITY ANGIOGRAPHY Right 07/15/2017   Procedure: Lower Extremity Angiography;  Surgeon: Annice Needy, MD;  Location: [facility] INVASIVE CV LAB;  Service: Cardiovascular;  Laterality: Right;    No family history on file.  Allergies  Allergen  Reactions  . Keppra [Levetiracetam] Rash       Assessment & Plan:  She presents for his first postoperative follow-up. He is status post a right femoral endarterectomy on 07/17/2017. Patient is without complaint with the exception of worsening left lower extremity pain. Patient continues to receive wound care from his podiatrist. There is a VAC on the right lower extremity. As per the patient, he states his right foot wound is healing well. Patient is complaining of left lower extremity pain with activity. He denies any rest pain or ulceration to the lower extremity. He denies any fever, nausea or vomiting. Asking for more narcotic pain medicine for pain.  1. Peripheral artery disease (HCC) - Stable Patient complaining of left lower extremity claudication. Patient states it has progressed the point he is unable to walk. Unable to palpate pedal pulses on exam. Recommend and aortic iliac and left lower extremity arterial duplex to assess anatomy. Patient may need an angiogram in the future.  - VAS [place] LOWER EXTREMITY ARTERIAL DUPLEX; Future - VAS US AORTA/IVC/ILIACS; Future  2. Tobacco use disorder - stable We had a discussion for approximately 10 minutes regarding the absolute need for smoking cessation due to the deleterious nature of tobacco on the vascular system. We discussed the tobacco use would diminish patency of any intervention, and likely significantly worsen progressio of disease. We discussed multiple agents for quitting including replacement therapy or medications to reduce cravings such as Chantix. The patient voices their understanding of the importance of smoking cessation.  3. Acute osteomyelitis of metatarsal bone of right foot (HCC) - stable Patient with VAC to the right foot. This is followed by podiatry. Endarterectomy site is healing well. We will follow this with ultrasounds on a regular basis.  Current Outpatient Prescriptions on File Prior to Visit  Medication Sig  Dispense Refill  . amLODipine (NORVASC) 10 MG tablet Take 1 tablet (10 mg total) by mouth daily. 30 tablet 0  . Ampicillin-Sulbactam 3 g in sodium chloride 0.9 % 100 mL Inject 3 g into the vein every 6 (six) hours. 360 g 0  . busPIRone (BUSPAR) 7.5 MG tablet Take 7.5 mg by mouth 2 (two) times daily.    Marland Kitchen gabapentin (NEURONTIN) 300 MG capsule Take 300 mg by mouth 3 (three) times daily.    Marland Kitchen lisinopril (PRINIVIL,ZESTRIL) 10 MG tablet Take 1 tablet (10 mg total) by mouth daily. 30 tablet 0  . nicotine (NICODERM CQ - DOSED IN MG/24 HOURS) 21 mg/24hr patch Place 1 patch (21 mg total) onto the skin daily. 28 patch 0  . OXYCONTIN 30 MG 12 hr tablet Take 1 tablet by mouth every 8 (eight) hours. 14 each 0  . polyethylene glycol (MIRALAX / GLYCOLAX) packet Take 17 g by mouth daily. 14 each 0  . sotalol (BETAPACE) 80 MG tablet Take 80 mg by mouth daily.    Marland Kitchen  SPIRIVA HANDIHALER 18 MCG inhalation capsule Place 1 capsule into inhaler and inhale daily.  1  . VIMPAT 50 MG TABS tablet Take 50 mg by mouth 2 (two) times daily.  2  . XARELTO 20 MG TABS tablet Take 20 mg by mouth daily.  2  . zolpidem (AMBIEN) 5 MG tablet Take 1 tablet (5 mg total) by mouth at bedtime as needed for sleep. 30 tablet 0   No current facility-administered medications on file prior to visit.     There are no Patient Instructions on file for this visit. No Follow-up on file.   Shaira Sova A Katheleen Stella, PA-C

## 2017-08-05 LAB — AEROBIC/ANAEROBIC CULTURE W GRAM STAIN (SURGICAL/DEEP WOUND)

## 2017-08-28 ENCOUNTER — Encounter (INDEPENDENT_AMBULATORY_CARE_PROVIDER_SITE_OTHER): Payer: Medicare HMO

## 2017-08-28 ENCOUNTER — Ambulatory Visit (INDEPENDENT_AMBULATORY_CARE_PROVIDER_SITE_OTHER): Payer: Medicare HMO | Admitting: Vascular Surgery

## 2017-09-09 ENCOUNTER — Inpatient Hospital Stay
Admission: EM | Admit: 2017-09-09 | Discharge: 2017-09-17 | DRG: 271 | Disposition: A | Discharge status: Home Health | Payer: Medicare HMO | Attending: Internal Medicine | Admitting: Internal Medicine

## 2017-09-09 ENCOUNTER — Emergency Department: Payer: Medicare HMO

## 2017-09-09 ENCOUNTER — Other Ambulatory Visit: Payer: Self-pay

## 2017-09-09 DIAGNOSIS — I743 Embolism and thrombosis of arteries of the lower extremities: Secondary | ICD-10-CM | POA: Diagnosis present

## 2017-09-09 DIAGNOSIS — M869 Osteomyelitis, unspecified: Secondary | ICD-10-CM | POA: Diagnosis present

## 2017-09-09 DIAGNOSIS — Z7982 Long term (current) use of aspirin: Secondary | ICD-10-CM

## 2017-09-09 DIAGNOSIS — Z79899 Other long term (current) drug therapy: Secondary | ICD-10-CM

## 2017-09-09 DIAGNOSIS — E1169 Type 2 diabetes mellitus with other specified complication: Secondary | ICD-10-CM | POA: Diagnosis present

## 2017-09-09 DIAGNOSIS — J449 Chronic obstructive pulmonary disease, unspecified: Secondary | ICD-10-CM | POA: Diagnosis present

## 2017-09-09 DIAGNOSIS — G4700 Insomnia, unspecified: Secondary | ICD-10-CM | POA: Diagnosis present

## 2017-09-09 DIAGNOSIS — G40909 Epilepsy, unspecified, not intractable, without status epilepticus: Secondary | ICD-10-CM | POA: Diagnosis present

## 2017-09-09 DIAGNOSIS — L03116 Cellulitis of left lower limb: Secondary | ICD-10-CM | POA: Diagnosis present

## 2017-09-09 DIAGNOSIS — K7460 Unspecified cirrhosis of liver: Secondary | ICD-10-CM | POA: Diagnosis present

## 2017-09-09 DIAGNOSIS — E1140 Type 2 diabetes mellitus with diabetic neuropathy, unspecified: Secondary | ICD-10-CM | POA: Diagnosis present

## 2017-09-09 DIAGNOSIS — E11621 Type 2 diabetes mellitus with foot ulcer: Secondary | ICD-10-CM | POA: Diagnosis present

## 2017-09-09 DIAGNOSIS — I2510 Atherosclerotic heart disease of native coronary artery without angina pectoris: Secondary | ICD-10-CM | POA: Diagnosis present

## 2017-09-09 DIAGNOSIS — Z8619 Personal history of other infectious and parasitic diseases: Secondary | ICD-10-CM

## 2017-09-09 DIAGNOSIS — L97528 Non-pressure chronic ulcer of other part of left foot with other specified severity: Secondary | ICD-10-CM | POA: Diagnosis present

## 2017-09-09 DIAGNOSIS — Z95828 Presence of other vascular implants and grafts: Secondary | ICD-10-CM

## 2017-09-09 DIAGNOSIS — E1152 Type 2 diabetes mellitus with diabetic peripheral angiopathy with gangrene: Principal | ICD-10-CM | POA: Diagnosis present

## 2017-09-09 DIAGNOSIS — F419 Anxiety disorder, unspecified: Secondary | ICD-10-CM | POA: Diagnosis present

## 2017-09-09 DIAGNOSIS — Z888 Allergy status to other drugs, medicaments and biological substances status: Secondary | ICD-10-CM | POA: Diagnosis not present

## 2017-09-09 DIAGNOSIS — I70261 Atherosclerosis of native arteries of extremities with gangrene, right leg: Secondary | ICD-10-CM | POA: Diagnosis not present

## 2017-09-09 DIAGNOSIS — I82432 Acute embolism and thrombosis of left popliteal vein: Secondary | ICD-10-CM | POA: Diagnosis present

## 2017-09-09 DIAGNOSIS — I70262 Atherosclerosis of native arteries of extremities with gangrene, left leg: Secondary | ICD-10-CM | POA: Diagnosis not present

## 2017-09-09 DIAGNOSIS — F17210 Nicotine dependence, cigarettes, uncomplicated: Secondary | ICD-10-CM | POA: Diagnosis present

## 2017-09-09 DIAGNOSIS — E871 Hypo-osmolality and hyponatremia: Secondary | ICD-10-CM

## 2017-09-09 DIAGNOSIS — I482 Chronic atrial fibrillation: Secondary | ICD-10-CM | POA: Diagnosis present

## 2017-09-09 DIAGNOSIS — I10 Essential (primary) hypertension: Secondary | ICD-10-CM | POA: Diagnosis present

## 2017-09-09 DIAGNOSIS — Z7901 Long term (current) use of anticoagulants: Secondary | ICD-10-CM

## 2017-09-09 DIAGNOSIS — I96 Gangrene, not elsewhere classified: Secondary | ICD-10-CM | POA: Diagnosis present

## 2017-09-09 LAB — CBC WITH DIFFERENTIAL/PLATELET
BASOS ABS: 0.1 10*3/uL (ref 0–0.1)
BASOS PCT: 1 %
EOS ABS: 0.4 10*3/uL (ref 0–0.7)
EOS PCT: 4 %
HCT: 32.9 % — ABNORMAL LOW (ref 40.0–52.0)
Hemoglobin: 11.2 g/dL — ABNORMAL LOW (ref 13.0–18.0)
LYMPHS PCT: 20 %
Lymphs Abs: 2.2 10*3/uL (ref 1.0–3.6)
MCH: 29.5 pg (ref 26.0–34.0)
MCHC: 34 g/dL (ref 32.0–36.0)
MCV: 86.8 fL (ref 80.0–100.0)
MONO ABS: 0.8 10*3/uL (ref 0.2–1.0)
Monocytes Relative: 8 %
Neutro Abs: 7.6 10*3/uL — ABNORMAL HIGH (ref 1.4–6.5)
Neutrophils Relative %: 67 %
PLATELETS: 302 10*3/uL (ref 150–440)
RBC: 3.79 MIL/uL — ABNORMAL LOW (ref 4.40–5.90)
RDW: 13.1 % (ref 11.5–14.5)
WBC: 11.2 10*3/uL — AB (ref 3.8–10.6)

## 2017-09-09 LAB — LACTIC ACID, PLASMA
LACTIC ACID, VENOUS: 1.3 mmol/L (ref 0.5–1.9)
Lactic Acid, Venous: 1.4 mmol/L (ref 0.5–1.9)

## 2017-09-09 LAB — URINALYSIS, COMPLETE (UACMP) WITH MICROSCOPIC
Bacteria, UA: NONE SEEN
Bilirubin Urine: NEGATIVE
GLUCOSE, UA: NEGATIVE mg/dL
HGB URINE DIPSTICK: NEGATIVE
Ketones, ur: NEGATIVE mg/dL
LEUKOCYTES UA: NEGATIVE
Nitrite: NEGATIVE
PH: 6 (ref 5.0–8.0)
PROTEIN: NEGATIVE mg/dL
Specific Gravity, Urine: 1.005 (ref 1.005–1.030)

## 2017-09-09 LAB — COMPREHENSIVE METABOLIC PANEL
ALBUMIN: 2.3 g/dL — AB (ref 3.5–5.0)
ALT: 23 U/L (ref 17–63)
ANION GAP: 11 (ref 5–15)
AST: 49 U/L — ABNORMAL HIGH (ref 15–41)
Alkaline Phosphatase: 106 U/L (ref 38–126)
BUN: 8 mg/dL (ref 6–20)
CO2: 24 mmol/L (ref 22–32)
Calcium: 8.2 mg/dL — ABNORMAL LOW (ref 8.9–10.3)
Chloride: 90 mmol/L — ABNORMAL LOW (ref 101–111)
Creatinine, Ser: 0.75 mg/dL (ref 0.61–1.24)
GFR calc Af Amer: >60 mL/min (ref >60)
GFR calc non Af Amer: >60 mL/min (ref >60)
GLUCOSE: 100 mg/dL — AB (ref 65–99)
POTASSIUM: 4.1 mmol/L (ref 3.5–5.1)
SODIUM: 125 mmol/L — AB (ref 135–145)
TOTAL PROTEIN: 7.6 g/dL (ref 6.5–8.1)
Total Bilirubin: 0.8 mg/dL (ref 0.3–1.2)

## 2017-09-09 LAB — CBC
HEMATOCRIT: 34.1 % — AB (ref 40.0–52.0)
HEMOGLOBIN: 11.3 g/dL — AB (ref 13.0–18.0)
MCH: 28.8 pg (ref 26.0–34.0)
MCHC: 33.2 g/dL (ref 32.0–36.0)
MCV: 86.8 fL (ref 80.0–100.0)
Platelets: 272 10*3/uL (ref 150–440)
RBC: 3.93 MIL/uL — ABNORMAL LOW (ref 4.40–5.90)
RDW: 13.2 % (ref 11.5–14.5)
WBC: 13.1 10*3/uL — AB (ref 3.8–10.6)

## 2017-09-09 LAB — APTT: APTT: 127 s — AB (ref 24–36)

## 2017-09-09 LAB — PROTIME-INR
INR: 2.62
PROTHROMBIN TIME: 27.8 s — AB (ref 11.4–15.2)

## 2017-09-09 MED ORDER — TIOTROPIUM BROMIDE MONOHYDRATE 18 MCG IN CAPS
1.0000 | ORAL_CAPSULE | Freq: Every day | RESPIRATORY_TRACT | Status: DC
Start: 2017-09-10 — End: 2017-09-17
  Administered 2017-09-10 – 2017-09-17 (×9): 18 ug via RESPIRATORY_TRACT
  Filled 2017-09-09 (×2): qty 5

## 2017-09-09 MED ORDER — AMLODIPINE BESYLATE 10 MG PO TABS
10.0000 mg | ORAL_TABLET | Freq: Every day | ORAL | Status: DC
  Administered 2017-09-10 – 2017-09-17 (×7): 10 mg via ORAL
  Filled 2017-09-09 (×2): qty 1
  Filled 2017-09-09: qty 2
  Filled 2017-09-09 (×4): qty 1

## 2017-09-09 MED ORDER — NICOTINE 21 MG/24HR TD PT24
21.0000 mg | MEDICATED_PATCH | Freq: Every day | TRANSDERMAL | Status: DC
  Administered 2017-09-09 – 2017-09-17 (×9): 21 mg via TRANSDERMAL
  Filled 2017-09-09 (×10): qty 1

## 2017-09-09 MED ORDER — PIPERACILLIN-TAZOBACTAM 3.375 G IVPB 30 MIN
3.3750 g | Freq: Once | INTRAVENOUS | Status: AC
  Administered 2017-09-09: 3.375 g via INTRAVENOUS
  Filled 2017-09-09: qty 50

## 2017-09-09 MED ORDER — TRAZODONE HCL 50 MG PO TABS
25.0000 mg | ORAL_TABLET | Freq: Every evening | ORAL | Status: DC | PRN
  Administered 2017-09-09: 25 mg via ORAL
  Filled 2017-09-09: qty 1

## 2017-09-09 MED ORDER — SOTALOL HCL 80 MG PO TABS
80.0000 mg | ORAL_TABLET | Freq: Every day | ORAL | Status: DC
  Administered 2017-09-10 – 2017-09-17 (×8): 80 mg via ORAL
  Filled 2017-09-09 (×8): qty 1

## 2017-09-09 MED ORDER — ONDANSETRON HCL 4 MG PO TABS: 4.0000 mg | ORAL_TABLET | Freq: Four times a day (QID) | ORAL | Status: DC | PRN

## 2017-09-09 MED ORDER — FENTANYL CITRATE (PF) 100 MCG/2ML IJ SOLN
50.0000 ug | Freq: Once | INTRAMUSCULAR | Status: AC
  Administered 2017-09-09: 50 ug via INTRAVENOUS
  Filled 2017-09-09: qty 2

## 2017-09-09 MED ORDER — DOCUSATE SODIUM 100 MG PO CAPS
100.0000 mg | ORAL_CAPSULE | Freq: Two times a day (BID) | ORAL | Status: DC
  Administered 2017-09-09 – 2017-09-17 (×13): 100 mg via ORAL
  Filled 2017-09-09 (×16): qty 1

## 2017-09-09 MED ORDER — SODIUM CHLORIDE 0.9 % IV SOLN
INTRAVENOUS | Status: DC
  Administered 2017-09-09 – 2017-09-12 (×4): via INTRAVENOUS

## 2017-09-09 MED ORDER — ASPIRIN EC 81 MG PO TBEC
81.0000 mg | DELAYED_RELEASE_TABLET | Freq: Every day | ORAL | Status: DC
  Administered 2017-09-14 – 2017-09-17 (×3): 81 mg via ORAL
  Filled 2017-09-09 (×7): qty 1

## 2017-09-09 MED ORDER — ACETAMINOPHEN 325 MG PO TABS
650.0000 mg | ORAL_TABLET | Freq: Four times a day (QID) | ORAL | Status: DC | PRN
  Administered 2017-09-10: 650 mg via ORAL
  Filled 2017-09-09: qty 2

## 2017-09-09 MED ORDER — VANCOMYCIN HCL IN DEXTROSE 1-5 GM/200ML-% IV SOLN
1000.0000 mg | Freq: Once | INTRAVENOUS | Status: AC
  Administered 2017-09-09: 1000 mg via INTRAVENOUS
  Filled 2017-09-09: qty 200

## 2017-09-09 MED ORDER — BISACODYL 5 MG PO TBEC: 5.0000 mg | DELAYED_RELEASE_TABLET | Freq: Every day | ORAL | Status: DC | PRN

## 2017-09-09 MED ORDER — ZOLPIDEM TARTRATE 5 MG PO TABS
5.0000 mg | ORAL_TABLET | Freq: Every evening | ORAL | Status: DC | PRN
  Administered 2017-09-10 – 2017-09-16 (×7): 5 mg via ORAL
  Filled 2017-09-09 (×7): qty 1

## 2017-09-09 MED ORDER — CELECOXIB 100 MG PO CAPS
100.0000 mg | ORAL_CAPSULE | Freq: Every day | ORAL | Status: DC
  Administered 2017-09-10 – 2017-09-16 (×6): 100 mg via ORAL
  Filled 2017-09-09 (×8): qty 1

## 2017-09-09 MED ORDER — ACETAMINOPHEN 650 MG RE SUPP: 650.0000 mg | Freq: Four times a day (QID) | RECTAL | Status: DC | PRN

## 2017-09-09 MED ORDER — OXYCODONE HCL ER 15 MG PO T12A
30.0000 mg | EXTENDED_RELEASE_TABLET | Freq: Three times a day (TID) | ORAL | Status: DC
  Administered 2017-09-09 – 2017-09-14 (×14): 30 mg via ORAL
  Filled 2017-09-09 (×14): qty 2

## 2017-09-09 MED ORDER — BUSPIRONE HCL 15 MG PO TABS
7.5000 mg | ORAL_TABLET | Freq: Two times a day (BID) | ORAL | Status: DC
  Administered 2017-09-09 – 2017-09-17 (×15): 7.5 mg via ORAL
  Filled 2017-09-09: qty 1.5
  Filled 2017-09-09 (×6): qty 1
  Filled 2017-09-09: qty 1.5
  Filled 2017-09-09: qty 1
  Filled 2017-09-09: qty 1.5
  Filled 2017-09-09: qty 1
  Filled 2017-09-09 (×2): qty 1.5
  Filled 2017-09-09 (×4): qty 1

## 2017-09-09 MED ORDER — HEPARIN BOLUS VIA INFUSION
3700.0000 [IU] | Freq: Once | INTRAVENOUS | Status: AC
  Administered 2017-09-09: 3700 [IU] via INTRAVENOUS
  Filled 2017-09-09: qty 3700

## 2017-09-09 MED ORDER — HEPARIN (PORCINE) IN NACL 100-0.45 UNIT/ML-% IJ SOLN
14.0000 [IU]/kg/h | INTRAMUSCULAR | Status: DC
  Administered 2017-09-09: 14 [IU]/kg/h via INTRAVENOUS
  Filled 2017-09-09: qty 250

## 2017-09-09 MED ORDER — GABAPENTIN 300 MG PO CAPS
300.0000 mg | ORAL_CAPSULE | Freq: Three times a day (TID) | ORAL | Status: DC
  Administered 2017-09-09 – 2017-09-17 (×21): 300 mg via ORAL
  Filled 2017-09-09 (×21): qty 1

## 2017-09-09 MED ORDER — ONDANSETRON HCL 4 MG/2ML IJ SOLN
4.0000 mg | Freq: Four times a day (QID) | INTRAMUSCULAR | Status: DC | PRN
  Administered 2017-09-10: 4 mg via INTRAVENOUS
  Filled 2017-09-09: qty 2

## 2017-09-09 MED ORDER — LISINOPRIL 10 MG PO TABS
10.0000 mg | ORAL_TABLET | Freq: Every day | ORAL | Status: DC
  Administered 2017-09-10 – 2017-09-17 (×7): 10 mg via ORAL
  Filled 2017-09-09 (×2): qty 1
  Filled 2017-09-09: qty 2
  Filled 2017-09-09: qty 1
  Filled 2017-09-09: qty 2
  Filled 2017-09-09 (×2): qty 1

## 2017-09-09 MED ORDER — LACOSAMIDE 50 MG PO TABS
50.0000 mg | ORAL_TABLET | Freq: Two times a day (BID) | ORAL | Status: DC
  Administered 2017-09-09 – 2017-09-17 (×14): 50 mg via ORAL
  Filled 2017-09-09 (×13): qty 1

## 2017-09-09 MED ORDER — HYDROCODONE-ACETAMINOPHEN 5-325 MG PO TABS
1.0000 | ORAL_TABLET | ORAL | Status: DC | PRN
  Administered 2017-09-09 – 2017-09-13 (×10): 2 via ORAL
  Administered 2017-09-16: 1 via ORAL
  Administered 2017-09-16 – 2017-09-17 (×4): 2 via ORAL
  Filled 2017-09-09: qty 2
  Filled 2017-09-09: qty 1
  Filled 2017-09-09 (×14): qty 2

## 2017-09-09 NOTE — ED Provider Notes (Signed)
[facility] Emergency Department Provider Note  ____________________________________________  Time seen: Approximately 4:40 PM  I have reviewed the triage vital signs and the nursing notes.   HISTORY  Chief Complaint Wound Infection   HPI Stanley Nguyen is a 71 y.o. male with h/o PAD (s/p 2 stents placed in right and left common iliac artery and right common femoral, superficial femoral and profunda femoris endarterectomy in 06/2017), afib on Xarelto, smoking, DM, cirrhosis who presents for evaluation of L foot pain. Patient reports 1 week of pain and progressively worsening necrosis of the left big toe. The patient has not seen a physician. Today was visited by the home health nurse for evaluation of the wound VAC on his right foot and he was told to come to the emergency room for a necrotic toe. Patient is complaining of 10 out of 10 pain in the left toe that has been getting worse since yesterday. Patient has been unable to walk. No fever or chills, no nausea or vomiting. He endorses compliance with his medications. He continues to smoke.    Past Medical History:  Diagnosis Date  . Atrial fibrillation (HCC)   . CAD (coronary artery disease)   . COPD (chronic obstructive pulmonary disease) (HCC)   . DM (diabetes mellitus) (HCC)    diet controlled  . Liver cirrhosis (HCC)   . Liver cirrhosis (HCC)   . Seizure disorder [facility](HCC)     Patient Active Problem List   Diagnosis Date Noted  . Tobacco use disorder 07/26/2017  . Peripheral artery disease (HCC) 07/26/2017  . Acute osteomyelitis of metatarsal bone of right foot (HCC) 07/10/2017    History reviewed. No pertinent surgical history.  Prior to Admission medications   Medication Sig Start Date End Date Taking? Authorizing Provider  amLODipine (NORVASC) 10 MG tablet Take 1 tablet (10 mg total) by mouth daily. 07/19/17   Adrian SaranMody, Sital, MD  aspirin EC 81 MG tablet Take by mouth.    [provider]    busPIRone (BUSPAR) 7.5 MG tablet Take 7.5 mg by mouth 2 (two) times daily. 07/09/17   [provider]  celecoxib (CELEBREX) 200 MG capsule  07/02/17   [provider]  gabapentin (NEURONTIN) 300 MG capsule Take 300 mg by mouth 3 (three) times daily. 07/09/17   [provider]  lisinopril (PRINIVIL,ZESTRIL) 10 MG tablet Take 1 tablet (10 mg total) by mouth daily. 07/19/17   Adrian SaranMody, Sital, MD  nicotine (NICODERM CQ - DOSED IN MG/24 HOURS) 21 mg/24hr patch Place 1 patch (21 mg total) onto the skin daily. 07/19/17   Adrian SaranMody, Sital, MD  OXYCONTIN 30 MG 12 hr tablet Take 1 tablet by mouth every 8 (eight) hours. 07/19/17   Adrian SaranMody, Sital, MD  polyethylene glycol (MIRALAX / GLYCOLAX) packet Take 17 g by mouth daily. 07/19/17   Adrian SaranMody, Sital, MD  sotalol (BETAPACE) 80 MG tablet Take 80 mg by mouth daily. 07/02/17   [provider]  SPIRIVA HANDIHALER 18 MCG inhalation capsule Place 1 capsule into inhaler and inhale daily. 07/03/17   [provider]  VIMPAT 50 MG TABS tablet Take 50 mg by mouth 2 (two) times daily. 07/02/17   [provider]  XARELTO 20 MG TABS tablet Take 20 mg by mouth daily. 06/24/17   [provider]  zolpidem (AMBIEN) 5 MG tablet Take 1 tablet (5 mg total) by mouth at bedtime as needed for sleep. 07/19/17   Adrian SaranMody, Sital, MD    Allergies Keppra [  levetiracetam]  No family history on file.  Social History Social History   Tobacco Use  . Smoking status: Current Every Day Smoker    Packs/day: 1.00    Types: Cigarettes  . Smokeless tobacco: Never Used  Substance Use Topics  . Alcohol use: No  . Drug use: No    Review of Systems  Constitutional: Negative for fever. Eyes: Negative for visual changes. ENT: Negative for sore throat. Neck: No neck pain  Cardiovascular: Negative for chest pain. Respiratory: Negative for shortness of breath. Gastrointestinal: Negative for abdominal pain, vomiting or diarrhea. Genitourinary: Negative for  dysuria. Musculoskeletal: Negative for back pain. + necrosis and pain of the L toe/ foot Skin: Negative for rash. Neurological: Negative for headaches, weakness or numbness. Psych: No SI or HI  ____________________________________________   PHYSICAL EXAM:  VITAL SIGNS: ED Triage Vitals  Enc Vitals Group     BP 09/09/17 1205 93/67     Pulse Rate 09/09/17 1205 66     Resp 09/09/17 1205 20     Temp 09/09/17 1205 98.5 F (36.9 C)     Temp Source 09/09/17 1205 Oral     SpO2 09/09/17 1205 100 %     Weight 09/09/17 1206 165 lb (74.8 kg)     Height 09/09/17 1206 5\' 10"  (1.778 m)     Head Circumference --      Peak Flow --      Pain Score 09/09/17 1205 10     Pain Loc --      Pain Edu? --      Excl. in GC? --     Constitutional: Alert and oriented. Well appearing and in no apparent distress. HEENT:      Head: Normocephalic and atraumatic.         Eyes: Conjunctivae are normal. Sclera is non-icteric.       Mouth/Throat: Mucous membranes are moist.       Neck: Supple with no signs of meningismus. Cardiovascular: Regular rate and rhythm. No murmurs, gallops, or rubs. 2+ symmetrical distal pulses are present in all extremities. No JVD. Respiratory: Normal respiratory effort. Lungs are clear to auscultation bilaterally. No wheezes, crackles, or rhonchi.  Gastrointestinal: Soft, non tender, and non distended with positive bowel sounds. No rebound or guarding. Musculoskeletal: Necrotic left big toe with a smaller necrotic part of the left second toe with overlying purulent discharge, warmth and erythema. Palpable left femoral pulse, nonpalpable or dopplerable left popliteal, DP and PT pulses. Neurologic: Normal speech and language. Face is symmetric. Moving all extremities. No gross focal neurologic deficits are appreciated. Skin: Skin is warm, dry and intact. No rash noted. Psychiatric: Mood and affect are normal. Speech and behavior are  normal.  ____________________________________________   LABS (all labs ordered are listed, but only abnormal results are displayed)  Labs Reviewed  COMPREHENSIVE METABOLIC PANEL - Abnormal; Notable for the following components:      Result Value   Sodium 125 (*)    Chloride 90 (*)    Glucose, Bld 100 (*)    Calcium 8.2 (*)    Albumin 2.3 (*)    AST 49 (*)    All other components within normal limits  CBC WITH DIFFERENTIAL/PLATELET - Abnormal; Notable for the following components:   WBC 11.2 (*)    RBC 3.79 (*)    Hemoglobin 11.2 (*)    HCT 32.9 (*)    Neutro Abs 7.6 (*)    All other components within normal limits  LACTIC  ACID, PLASMA  LACTIC ACID, PLASMA  URINALYSIS, COMPLETE (UACMP) WITH MICROSCOPIC   ____________________________________________  EKG  none  ____________________________________________  RADIOLOGY  XR L foot: Subtle lucency in the inferior aspect of the calcaneus, which could suggest a focus of osteomyelitis. This could be further evaluated with MRI of the foot with and without IV gadolinium if clinically appropriate.  ____________________________________________   PROCEDURES  Procedure(s) performed: None Procedures Critical Care performed: yes  CRITICAL CARE Performed by: Nita Sickle  ?  Total critical care time: 40 min  Critical care time was exclusive of separately billable procedures and treating other patients.  Critical care was necessary to treat or prevent imminent or life-threatening deterioration.  Critical care was time spent personally by me on the following activities: development of treatment plan with patient and/or surrogate as well as nursing, discussions with consultants, evaluation of patient's response to treatment, examination of patient, obtaining history from patient or surrogate, ordering and performing treatments and interventions, ordering and review of laboratory studies, ordering and review of radiographic  studies, pulse oximetry and re-evaluation of patient's condition.  ____________________________________________   INITIAL IMPRESSION / ASSESSMENT AND PLAN / ED COURSE  71 y.o. male with h/o PAD (s/p 2 stents placed in right and left common iliac artery and right common femoral, superficial femoral and profunda femoris endarterectomy in 06/2017), afib on Xarelto, smoking, DM, cirrhosis who presents for evaluation of necrosis of the L big toe with overlying cellulitis. Palpable L femoral pulse, nonpalpable or dopplerable L popliteal/ DP/ PT pulses on the L. patient is not septic at this time however does have a soft blood pressure 93/67. His labs show a mild leukocytosis with white count of 11.2, hyponatremic with sodium of 125. Will start patient on heparin, fentanyl for pain, IV fluids, vancomycin and Zosyn, consult vascular surgery and admitted to the hospitalist service.    _________________________ 5:26 PM on 09/09/2017 -----------------------------------------  Discussed with Dr. Gilda Crease, vascular surgeon on call who will plan debridement in the OR. Agrees with heparin and abx. Will admit to the hospitalist service.   As part of my medical decision making, I reviewed the following data within the electronic MEDICAL RECORD NUMBER History obtained from family, Nursing notes reviewed and incorporated, Labs reviewed , Radiograph reviewed , Discussed with admitting physician , A consult was requested and obtained from this/these consultant(s) Vascular surgery, Notes from prior ED visits and [place] Controlled Substance Database    Pertinent labs & imaging results that were available during my care of the patient were reviewed by me and considered in my medical decision making (see chart for details).    ____________________________________________   FINAL CLINICAL IMPRESSION(S) / ED DIAGNOSES  Final diagnoses:  Gangrenous toe (HCC)  Cellulitis of left foot  Hyponatremia      NEW MEDICATIONS  STARTED DURING THIS VISIT:  This SmartLink is deprecated. Use AVSMEDLIST instead to display the medication list for a patient.   Note:  This document was prepared using Dragon voice recognition software and may include unintentional dictation errors.    Nita Sickle, MD 09/09/17 [phone]

## 2017-09-09 NOTE — H&P (Signed)
[facility] - [place] at [facility]   PATIENT NAME: Stanley Nguyen    MR#:  [id number]  DATE OF BIRTH:  1946/06/19  DATE OF ADMISSION:  09/09/2017  PRIMARY CARE PHYSICIAN: [name], MD   REQUESTING/REFERRING PHYSICIAN: Dr. Darden Dates CHIEF COMPLAINT: Wound infection   Chief Complaint  Patient presents with  . Wound Infection    HISTORY OF PRESENT ILLNESS:  Stanley Nguyen  is a 71 y.o. male with a known history of full artery disease status post 2 stents placed in the right and left common  iliac arteries, endarterectomy in September 2018, history of right foot osteomyelitis getting  Augmentin, waiting wound VAC the right foot and recently  discharged from [employer] went home 2 days ago, today home health nurse noticed that his great toe on the left foot is necrotic and black.  Patient has been having pain for about a week getting progressively worse and noted to have necrosis of the left big toe.  Patient has severe pain in the left foot area 10 out of 10 in severity right now.  No fever or chills.  No nausea or vomiting.  Patient continues to smoke heavily.  PAST MEDICAL HISTORY:   Past Medical History:  Diagnosis Date  . Atrial fibrillation (HCC)   . CAD (coronary artery disease)   . COPD (chronic obstructive pulmonary disease) (HCC)   . DM (diabetes mellitus) (HCC)    diet controlled  . Liver cirrhosis (HCC)   . Liver cirrhosis (HCC)   . Seizure disorder (HCC)     PAST SURGICAL HISTOIRY:  History reviewed. No pertinent surgical history.  SOCIAL HISTORY:   Social History   Tobacco Use  . Smoking status: Current Every Day Smoker    Packs/day: 1.00    Types: Cigarettes  . Smokeless tobacco: Never Used  Substance Use Topics  . Alcohol use: No    FAMILY HISTORY:  No family history on file.  DRUG ALLERGIES:   Allergies  Allergen Reactions  . Keppra [Levetiracetam] Rash    REVIEW OF SYSTEMS:  CONSTITUTIONAL: No fever, fatigue or  weakness.  EYES: No blurred or double vision.  EARS, NOSE, AND THROAT: No tinnitus or ear pain.  RESPIRATORY: No cough, shortness of breath, wheezing or hemoptysis.  CARDIOVASCULAR: No chest pain, orthopnea, edema.  GASTROINTESTINAL: No nausea, vomiting, diarrhea or abdominal pain.  GENITOURINARY: No dysuria, hematuria.  ENDOCRINE: No polyuria, nocturia,  HEMATOLOGY: No anemia, easy bruising or bleeding SKIN: No rash or lesion. MUSCULOSKELETAL: Patient has wound VAC present on the right foot.  No pulse on the left foot,  necrotic black great toe.  Dorsalis pedis is not palpable on the left foot NEUROLOGIC: No tingling, numbness, weakness.  PSYCHIATRY: No anxiety or depression.   MEDICATIONS AT HOME:   Prior to Admission medications   Medication Sig Start Date End Date Taking? Authorizing Provider  amLODipine (NORVASC) 10 MG tablet Take 1 tablet (10 mg total) by mouth daily. 07/19/17   Adrian Saran, MD  aspirin EC 81 MG tablet Take by mouth.    [provider]  busPIRone (BUSPAR) 7.5 MG tablet Take 7.5 mg by mouth 2 (two) times daily. 07/09/17   [provider]  celecoxib (CELEBREX) 200 MG capsule  07/02/17   [provider]  gabapentin (NEURONTIN) 300 MG capsule Take 300 mg by mouth 3 (three) times daily. 07/09/17   [provider]  lisinopril (PRINIVIL,ZESTRIL) 10 MG tablet Take 1 tablet (10 mg total) by mouth  daily. 07/19/17   Adrian SaranMody, Sital, MD  nicotine (NICODERM CQ - DOSED IN MG/24 HOURS) 21 mg/24hr patch Place 1 patch (21 mg total) onto the skin daily. 07/19/17   Adrian SaranMody, Sital, MD  OXYCONTIN 30 MG 12 hr tablet Take 1 tablet by mouth every 8 (eight) hours. 07/19/17   Adrian SaranMody, Sital, MD  polyethylene glycol (MIRALAX / GLYCOLAX) packet Take 17 g by mouth daily. 07/19/17   Adrian SaranMody, Sital, MD  sotalol (BETAPACE) 80 MG tablet Take 80 mg by mouth daily. 07/02/17   [provider]  SPIRIVA HANDIHALER 18 MCG inhalation capsule Place 1 capsule into inhaler and inhale  daily. 07/03/17   [provider]  VIMPAT 50 MG TABS tablet Take 50 mg by mouth 2 (two) times daily. 07/02/17   [provider]  XARELTO 20 MG TABS tablet Take 20 mg by mouth daily. 06/24/17   [provider]  zolpidem (AMBIEN) 5 MG tablet Take 1 tablet (5 mg total) by mouth at bedtime as needed for sleep. 07/19/17   Adrian SaranMody, Sital, MD      VITAL SIGNS:  Blood pressure 93/67, pulse 66, temperature 98.5 F (36.9 C), temperature source Oral, resp. rate 20, height 5\' 10"  (1.778 m), weight 74.8 kg (165 lb), SpO2 100 %.  PHYSICAL EXAMINATION:  GENERAL:  71 y.o.-year-old patient lying in the bed with no acute distress.  EYES: Pupils equal, round, reactive to light and accommodation. No scleral icterus. Extraocular muscles intact.  HEENT: Head atraumatic, normocephalic. Oropharynx and nasopharynx clear.  NECK:  Supple, no jugular venous distention. No thyroid enlargement, no tenderness.  LUNGS: Normal breath sounds bilaterally, no wheezing, rales,rhonchi or crepitation. No use of accessory muscles of respiration.  CARDIOVASCULAR: S1, S2 normal. No murmurs, rubs, or gallops.  ABDOMEN: Soft, nontender, nondistended. Bowel sounds present. No organomegaly or mass.  EXTREMITIES: No pedal edema, cyanosis, or clubbing.  NEUROLOGIC: Cranial nerves II through XII are intact. Muscle strength 5/5 in all extremities. Sensation intact. Gait not checked.  PSYCHIATRIC: The patient is alert and oriented x 3.  SKIN: Necrotic left big toe,.  No pulse on dorsalis pedis on left foot, left popliteal also is not palpable. Did have purulent discharge coming out of left second toe and also left great toe. LABORATORY PANEL:   CBC Recent Labs  Lab 09/09/17 1206  WBC 11.2*  HGB 11.2*  HCT 32.9*  PLT 302   ------------------------------------------------------------------------------------------------------------------  Chemistries  Recent Labs  Lab 09/09/17 1206  NA 125*  K 4.1  CL 90*   CO2 24  GLUCOSE 100*  BUN 8  CREATININE 0.75  CALCIUM 8.2*  AST 49*  ALT 23  ALKPHOS 106  BILITOT 0.8   ------------------------------------------------------------------------------------------------------------------  Cardiac Enzymes No results for input(s): TROPONINI in the last 168 hours. ------------------------------------------------------------------------------------------------------------------  RADIOLOGY:  Dg Foot Complete Left  Result Date: 09/09/2017 CLINICAL DATA:  71 year old male with history of pain in the left foot. Unable to stand and bear week due to infection of the left foot. EXAM: LEFT FOOT - COMPLETE 3+ VIEW COMPARISON:  No priors. FINDINGS: There is no evidence of fracture or dislocation. There is no evidence of arthropathy. Subtle lucency in the inferior aspect of the calcaneus. Soft tissues are unremarkable. IMPRESSION: 1. Subtle lucency in the inferior aspect of the calcaneus, which could suggest a focus of osteomyelitis. This could be further evaluated with MRI of the foot with and without IV gadolinium if clinically appropriate. Electronically Signed   By: Trudie Reedaniel  Entrikin M.D.   On:  09/09/2017 16:47    EKG:   Orders placed or performed in visit on 01/09/08  . EKG 12-Lead    IMPRESSION AND PLAN:   Peripheral artery disease with left great toe ischemia:/Gangrene of the toe; started on heparin drip by ER physician, continue that, vascular was consulted, Dr. smear will be seeing the patient tomorrow continue to monitor pulses, continue heparin drip, 2 peripheral artery disease status post stent in right and left common iliac artery, patient also has right foot osteomyelitis getting wound VAC and also Augmentin as per Dr. fFitzgerald recommendation, patient was seen by Dr. Sampson GoonFitzgerald on October 30.  The right foot looks better today.  No erythema.  We will order wound VAC., 3.  Hyponatremia: Continue gentle hydration 4.  Severe pain in the right foot and  left foot: Continue OxyContin, add IV Dilaudid. 5.  Tobacco abuse, counseled to quit smoking, offered nicotine patch. 6 chronic atrial fibrillation controlled, continue sotalol, hold Xarelto, continue heparin drip. 7.  Diabetes mellitus type 2 without long-term use of insulin:  8. history of COPD, no wheezing.  Continue Spiriva.  All the records are reviewed and case discussed with ED provider. Management plans discussed with the patient, family and they are in agreement.  CODE STATUS: full  TOTAL TIME TAKING CARE OF THIS PATIENT: 55 minutes.    Katha HammingSnehalatha Keng Jewel M.D on 09/09/2017 at 5:45 PM  Between 7am to 6pm - Pager - [phone]  After 6pm go to [email or web address] - password [email or web address] [facility]  [facility]  Office  [phone]  CC: Primary care physician; Sherron Mondayejan-Sie, S Ahmed, MD  Note: This dictation was prepared with Dragon dictation along with smaller phrase technology. Any transcriptional errors that result from this process are unintentional.

## 2017-09-09 NOTE — ED Triage Notes (Signed)
Pt to ER via [facility] from home for pain to left foot. Pt unable to stand and bear weight to feet due to gangrene infection to left foot. Pt has wound vac on right foot at time of arrival for infection. Pt alert and oriented X4, active, cooperative, pt in NAD. RR even and unlabored, color WNL.

## 2017-09-09 NOTE — Progress Notes (Signed)
ANTICOAGULATION CONSULT NOTE - Initial Consult  Pharmacy Consult for heparin gtt Indication: PAD necrotic foot  Allergies  Allergen Reactions  . Keppra [Levetiracetam] Rash    Patient Measurements: Height: 5\' 10"  (177.8 cm) Weight: 165 lb (74.8 kg) IBW/kg (Calculated) : 73 Heparin Dosing Weight: 74.5kg  Vital Signs: Temp: 98.5 F (36.9 C) (11/12 1205) Temp Source: Oral (11/12 1205) BP: 93/67 (11/12 1205) Pulse Rate: 66 (11/12 1205)  Labs: Recent Labs    09/09/17 1206  HGB 11.2*  HCT 32.9*  PLT 302  CREATININE 0.75    Estimated Creatinine Clearance: 88.7 mL/min (by C-G formula based on SCr of 0.75 mg/dL).   Medical History: Past Medical History:  Diagnosis Date  . Atrial fibrillation (HCC)   . CAD (coronary artery disease)   . COPD (chronic obstructive pulmonary disease) (HCC)   . DM (diabetes mellitus) (HCC)    diet controlled  . Liver cirrhosis (HCC)   . Liver cirrhosis (HCC)   . Seizure disorder (HCC)     Medications:   (Not in a hospital admission) Scheduled:  . fentaNYL (SUBLIMAZE) injection  50 mcg Intravenous Once  . heparin  3,700 Units Intravenous Once   Infusions:  . heparin    . piperacillin-tazobactam    . vancomycin     PRN:  Anti-infectives (From admission, onward)   Start     Dose/Rate Route Frequency Ordered Stop   09/09/17 1630  piperacillin-tazobactam (ZOSYN) IVPB 3.375 g     3.375 g 100 mL/hr over 30 Minutes Intravenous  Once 09/09/17 1629     09/09/17 1630  vancomycin (VANCOCIN) IVPB 1000 mg/200 mL premix     1,000 mg 200 mL/hr over 60 Minutes Intravenous  Once 09/09/17 1629        Assessment: 71 year old male with necrotic foot secondary to PAD, pharmacy consulted for heparin gtt management.   Goal of Therapy:  Heparin level 0.3-0.7 units/ml Monitor platelets by anticoagulation protocol: Yes   Plan:  Give 3700 units bolus x 1 Start heparin infusion at 1050 units/hr Check anti-Xa level in 6 hours and daily while on  heparin Continue to monitor H&H and platelets  Gerre PebblesGarrett Jakyren Fluegge 09/09/2017,4:53 PM

## 2017-09-09 NOTE — Plan of Care (Signed)
Patient well known to Dr Wyn Quakerew  The plan is for left leg angiogram with intervention as needed on Wednesday

## 2017-09-10 LAB — CBC
HEMATOCRIT: 28 % — AB (ref 40.0–52.0)
HEMOGLOBIN: 9.5 g/dL — AB (ref 13.0–18.0)
MCH: 29.6 pg (ref 26.0–34.0)
MCHC: 33.9 g/dL (ref 32.0–36.0)
MCV: 87.3 fL (ref 80.0–100.0)
Platelets: 186 10*3/uL (ref 150–440)
RBC: 3.2 MIL/uL — AB (ref 4.40–5.90)
RDW: 13.1 % (ref 11.5–14.5)
WBC: 7.4 10*3/uL (ref 3.8–10.6)

## 2017-09-10 LAB — APTT
APTT: 95 s — AB (ref 24–36)
aPTT: >160 seconds (ref 24–36)

## 2017-09-10 LAB — BASIC METABOLIC PANEL
Anion gap: 5 (ref 5–15)
BUN: 10 mg/dL (ref 6–20)
CHLORIDE: 102 mmol/L (ref 101–111)
CO2: 23 mmol/L (ref 22–32)
Calcium: 6.7 mg/dL — ABNORMAL LOW (ref 8.9–10.3)
Creatinine, Ser: 0.73 mg/dL (ref 0.61–1.24)
GFR calc non Af Amer: >60 mL/min (ref >60)
Glucose, Bld: 84 mg/dL (ref 65–99)
POTASSIUM: 3 mmol/L — AB (ref 3.5–5.1)
SODIUM: 130 mmol/L — AB (ref 135–145)

## 2017-09-10 LAB — HEPARIN LEVEL (UNFRACTIONATED)
Heparin Unfractionated: 3.04 IU/mL — ABNORMAL HIGH (ref 0.30–0.70)
Heparin Unfractionated: 2.46 IU/mL — ABNORMAL HIGH (ref 0.30–0.70)

## 2017-09-10 LAB — GLUCOSE, CAPILLARY: GLUCOSE-CAPILLARY: 98 mg/dL (ref 65–99)

## 2017-09-10 MED ORDER — DEXTROSE 5 % IV SOLN
1.5000 g | INTRAVENOUS | Status: AC
  Administered 2017-09-11: 1.5 g via INTRAVENOUS
  Filled 2017-09-10: qty 1.5

## 2017-09-10 MED ORDER — POTASSIUM CHLORIDE CRYS ER 20 MEQ PO TBCR
40.0000 meq | EXTENDED_RELEASE_TABLET | Freq: Once | ORAL | Status: AC
  Administered 2017-09-10: 40 meq via ORAL
  Filled 2017-09-10: qty 2

## 2017-09-10 MED ORDER — POLYETHYLENE GLYCOL 3350 17 G PO PACK
17.0000 g | PACK | Freq: Every day | ORAL | Status: DC
  Administered 2017-09-10 – 2017-09-14 (×3): 17 g via ORAL
  Filled 2017-09-10 (×6): qty 1

## 2017-09-10 MED ORDER — SODIUM CHLORIDE 0.9 % IV SOLN
3.0000 g | Freq: Four times a day (QID) | INTRAVENOUS | Status: DC
  Administered 2017-09-10 – 2017-09-14 (×13): 3 g via INTRAVENOUS
  Administered 2017-09-14: 14:00:00 via INTRAVENOUS
  Administered 2017-09-14 – 2017-09-17 (×11): 3 g via INTRAVENOUS
  Filled 2017-09-10 (×32): qty 3

## 2017-09-10 MED ORDER — HEPARIN (PORCINE) IN NACL 100-0.45 UNIT/ML-% IJ SOLN
750.0000 [IU]/h | INTRAMUSCULAR | Status: DC
  Administered 2017-09-10 (×2): 750 [IU]/h via INTRAVENOUS
  Filled 2017-09-10: qty 250

## 2017-09-10 MED ORDER — POTASSIUM CHLORIDE CRYS ER 20 MEQ PO TBCR
20.0000 meq | EXTENDED_RELEASE_TABLET | Freq: Every day | ORAL | Status: DC
  Administered 2017-09-11 – 2017-09-16 (×5): 20 meq via ORAL
  Filled 2017-09-10 (×7): qty 1

## 2017-09-10 NOTE — Progress Notes (Signed)
[facility] - [place] at [facility]   PATIENT NAME: Stanley CairoBruce Henk    MR#:  [id number]  DATE OF BIRTH:  1946-05-04  SUBJECTIVE:   Patient here due to left foot pain and noted to have a left great toe that was necrotic/gangrenous in nature. Scheduled for angiogram for tomorrow. Still complaining of significant pain on even mild palpation to left foot.  REVIEW OF SYSTEMS:    Review of Systems  Constitutional: Negative for chills and fever.  HENT: Negative for congestion and tinnitus.   Eyes: Negative for blurred vision and double vision.  Respiratory: Negative for cough, shortness of breath and wheezing.   Cardiovascular: Negative for chest pain, orthopnea and PND.  Gastrointestinal: Negative for abdominal pain, diarrhea, nausea and vomiting.  Genitourinary: Negative for dysuria and hematuria.  Musculoskeletal: Positive for joint pain (left foot/great Toe. ).  Neurological: Negative for dizziness, sensory change and focal weakness.  All other systems reviewed and are negative.   Nutrition: Heart healthy Tolerating Diet: Yes Tolerating PT: Await Eval.    DRUG ALLERGIES:   Allergies  Allergen Reactions  . Keppra [Levetiracetam] Rash    VITALS:  Blood pressure (!) 148/56, pulse 62, temperature 98.2 F (36.8 C), temperature source Oral, resp. rate 16, height 5\' 10"  (1.778 m), weight 70.8 kg (156 lb), SpO2 97 %.  PHYSICAL EXAMINATION:   Physical Exam  GENERAL:  71 y.o.-year-old patient lying in bed in mild distress with left foot pain.   EYES: Pupils equal, round, reactive to light and accommodation. No scleral icterus. Extraocular muscles intact.  HEENT: Head atraumatic, normocephalic. Oropharynx and nasopharynx clear.  NECK:  Supple, no jugular venous distention. No thyroid enlargement, no tenderness.  LUNGS: Normal breath sounds bilaterally, no wheezing, rales, rhonchi. No use of accessory muscles of respiration.  CARDIOVASCULAR: S1, S2 normal. No murmurs,  rubs, or gallops.  ABDOMEN: Soft, nontender, nondistended. Bowel sounds present. No organomegaly or mass.  EXTREMITIES: Right foot with a wound VAC in place, left foot with a great toe that is necrotic in nature with cyanosis noted. Pulses are not palpable bilaterally, no edema.  NEUROLOGIC: Cranial nerves II through XII are intact. No focal Motor or sensory deficits b/l.   PSYCHIATRIC: The patient is alert and oriented x 3.  SKIN: No obvious rash, lesion, or ulcer.    LABORATORY PANEL:   CBC Recent Labs  Lab 09/10/17 0523  WBC 7.4  HGB 9.5*  HCT 28.0*  PLT 186   ------------------------------------------------------------------------------------------------------------------  Chemistries  Recent Labs  Lab 09/09/17 1206 09/10/17 0523  NA 125* 130*  K 4.1 3.0*  CL 90* 102  CO2 24 23  GLUCOSE 100* 84  BUN 8 10  CREATININE 0.75 0.73  CALCIUM 8.2* 6.7*  AST 49*  --   ALT 23  --   ALKPHOS 106  --   BILITOT 0.8  --    ------------------------------------------------------------------------------------------------------------------  Cardiac Enzymes No results for input(s): TROPONINI in the last 168 hours. ------------------------------------------------------------------------------------------------------------------  RADIOLOGY:  Dg Foot Complete Left  Result Date: 09/09/2017 CLINICAL DATA:  71 year old male with history of pain in the left foot. Unable to stand and bear week due to infection of the left foot. EXAM: LEFT FOOT - COMPLETE 3+ VIEW COMPARISON:  No priors. FINDINGS: There is no evidence of fracture or dislocation. There is no evidence of arthropathy. Subtle lucency in the inferior aspect of the calcaneus. Soft tissues are unremarkable. IMPRESSION: 1. Subtle lucency in the inferior aspect of the calcaneus, which could suggest  a focus of osteomyelitis. This could be further evaluated with MRI of the foot with and without IV gadolinium if clinically appropriate.  Electronically Signed   By: Trudie Reedaniel  Entrikin M.D.   On: 09/09/2017 16:47     ASSESSMENT AND PLAN:   71 year old male with past medical history of seizure disorder, liver cirrhosis, diabetes, COPD, history of coronary artery disease, atrial fibrillation and presents to the hospital due to left foot pain and swelling.  1. Left foot/great toe gangrene - appreciate vascular surgery input and plan for angiogram tomorrow. -Continue empiric Unasyn, heparin drip. -Patient may likely need amputation and podiatry is following the patient further input from vascular surgery first.  2. Essential hypertension-continue Norvasc, lisinopril.  3. History of atrial fibrillation-rate controlled. Continue sotalol. Continue heparin drip for now. Next  4. COPD-no acute exacerbation-continue Spiriva  6. History of seizures-continue glucosamine.  7. Neuropathy-continue gabapentin.  8. Anxiety-continue BuSpar.  All the records are reviewed and case discussed with Care Management/Social Worker. Management plans discussed with the patient, family and they are in agreement.  CODE STATUS: Full code  DVT Prophylaxis: Heparin gtt  TOTAL TIME TAKING CARE OF THIS PATIENT: 30 minutes.   POSSIBLE D/C IN 2-3 DAYS, DEPENDING ON CLINICAL CONDITION.   Houston SirenSAINANI,Kuba Shepherd J M.D on 09/10/2017 at 2:33 PM  Between 7am to 6pm - Pager - [phone]  After 6pm go to [email or web address] - Social research officer, governmentpassword [email or web address]  [facility] [place] [facility]  Office  [phone]  CC: Primary care physician; Sherron Mondayejan-Sie, S Ahmed, MD

## 2017-09-10 NOTE — Consult Note (Signed)
[facility] Infectious Disease     Reason for Consult:PAD, foot ulcer    Referring Physician: Governor Specking Date of Admission:  09/09/2017   Active Problems:   Gangrene of toe of left foot ([place])   HPI: Stanley Nguyen is a 71 y.o. male admitted with L foot gangrene. I had been following as otpt for R foot osteomyelitis s/p surgery and 4 week course of IV unasyn for MSSA and Strep Anginosis. He was seen recently by me and changed to augmentin.  He had started to develop discoloration on the L foot and we had arranged to have him seen by vascular the next day but he did not go. He has now developed L foot gangrene. Had been continuing to smoke at the last visit.   Past Medical History:  Diagnosis Date  . Atrial fibrillation ([place])   . CAD (coronary artery disease)   . COPD (chronic obstructive pulmonary disease) ([place])   . DM (diabetes mellitus) ([place])    diet controlled  . Liver cirrhosis ([place])   . Liver cirrhosis ([place])   . Seizure disorder [facility])    History reviewed. No pertinent surgical history. Social History   Tobacco Use  . Smoking status: Current Every Day Smoker    Packs/day: 1.00    Types: Cigarettes  . Smokeless tobacco: Never Used  Substance Use Topics  . Alcohol use: No  . Drug use: No   History reviewed. No pertinent family history.  Allergies:  Allergies  Allergen Reactions  . Keppra [Levetiracetam] Rash    Current antibiotics: Antibiotics Given (last 72 hours)    Date/Time Action Medication Dose Rate   09/09/17 1819 New Bag/Given   piperacillin-tazobactam (ZOSYN) IVPB 3.375 g 3.375 g 100 mL/hr   09/09/17 1819 New Bag/Given   vancomycin (VANCOCIN) IVPB 1000 mg/200 mL premix 1,000 mg 200 mL/hr      MEDICATIONS: . amLODipine  10 mg Oral Daily  . aspirin EC  81 mg Oral Daily  . busPIRone  7.5 mg Oral BID  . celecoxib  100 mg Oral Daily  . docusate sodium  100 mg Oral BID  . gabapentin  300 mg Oral TID  . lacosamide  50 mg Oral BID  . lisinopril  10 mg  Oral Daily  . nicotine  21 mg Transdermal Daily  . oxyCODONE  30 mg Oral Q8H  . sotalol  80 mg Oral Daily  . tiotropium  1 capsule Inhalation Daily    Review of Systems - 11 systems reviewed and negative per HPI   OBJECTIVE: Temp:  [98.2 F (36.8 C)-98.5 F (36.9 C)] 98.5 F (36.9 C) (11/13 0512) Pulse Rate:  [65-69] 66 (11/13 0512) Resp:  [16-20] 16 (11/13 0512) BP: (72-144)/(47-121) 109/47 (11/13 0512) SpO2:  [96 %-100 %] 96 % (11/13 0512) Weight:  [69.1 kg (152 lb 6.4 oz)-70.8 kg (156 lb)] 70.8 kg (156 lb) (11/13 0504) Physical Exam  Constitutional: He is oriented to person, place, and time.thin, frail  HENT: anicteric  Mouth/Throat: Oropharynx is clear and moist. No oropharyngeal exudate.  Cardiovascular: Normal rate, regular rhythm and normal heart sounds. Pulmonary/Chest: Effort normal and breath sounds normal. No respiratory distress. He has no wheezes.  Abdominal: Soft. Bowel sounds are normal. He exhibits no distension. There is no tenderness.  Lymphadenopathy: He has no cervical adenopathy.  Neurological: He is alert and oriented to person, place, and time.  Skin: Derm: The dorsal aspect of his right foot has wound vac L foot with gangrene of  great toe Psychiatric: He has a normal mood and affect. His behavior is normal.     LABS: Results for orders placed or performed during the hospital encounter of 09/09/17 (from the past 48 hour(s))  Lactic acid, plasma     Status: None   Collection Time: 09/09/17 12:06 PM  Result Value Ref Range   Lactic Acid, Venous 1.3 0.5 - 1.9 mmol/L  Comprehensive metabolic panel     Status: Abnormal   Collection Time: 09/09/17 12:06 PM  Result Value Ref Range   Sodium 125 (L) 135 - 145 mmol/L   Potassium 4.1 3.5 - 5.1 mmol/L   Chloride 90 (L) 101 - 111 mmol/L   CO2 24 22 - 32 mmol/L   Glucose, Bld 100 (H) 65 - 99 mg/dL   BUN 8 6 - 20 mg/dL   Creatinine, Ser 0.75 0.61 - 1.24 mg/dL   Calcium 8.2 (L) 8.9 - 10.3 mg/dL   Total  Protein 7.6 6.5 - 8.1 g/dL   Albumin 2.3 (L) 3.5 - 5.0 g/dL   AST 49 (H) 15 - 41 U/L   ALT 23 17 - 63 U/L   Alkaline Phosphatase 106 38 - 126 U/L   Total Bilirubin 0.8 0.3 - 1.2 mg/dL   GFR calc non Af Amer >60 >60 mL/min   GFR calc Af Amer >60 >60 mL/min    Comment: (NOTE) The eGFR has been calculated using the CKD EPI equation. This calculation has not been validated in all clinical situations. eGFR's persistently <60 mL/min signify possible Chronic Kidney Disease.    Anion gap 11 5 - 15  CBC with Differential     Status: Abnormal   Collection Time: 09/09/17 12:06 PM  Result Value Ref Range   WBC 11.2 (H) 3.8 - 10.6 K/uL   RBC 3.79 (L) 4.40 - 5.90 MIL/uL   Hemoglobin 11.2 (L) 13.0 - 18.0 g/dL   HCT 32.9 (L) 40.0 - 52.0 %   MCV 86.8 80.0 - 100.0 fL   MCH 29.5 26.0 - 34.0 pg   MCHC 34.0 32.0 - 36.0 g/dL   RDW 13.1 11.5 - 14.5 %   Platelets 302 150 - 440 K/uL   Neutrophils Relative % 67 %   Neutro Abs 7.6 (H) 1.4 - 6.5 K/uL   Lymphocytes Relative 20 %   Lymphs Abs 2.2 1.0 - 3.6 K/uL   Monocytes Relative 8 %   Monocytes Absolute 0.8 0.2 - 1.0 K/uL   Eosinophils Relative 4 %   Eosinophils Absolute 0.4 0 - 0.7 K/uL   Basophils Relative 1 %   Basophils Absolute 0.1 0 - 0.1 K/uL  Lactic acid, plasma     Status: None   Collection Time: 09/09/17  8:24 PM  Result Value Ref Range   Lactic Acid, Venous 1.4 0.5 - 1.9 mmol/L  CBC     Status: Abnormal   Collection Time: 09/09/17  8:24 PM  Result Value Ref Range   WBC 13.1 (H) 3.8 - 10.6 K/uL   RBC 3.93 (L) 4.40 - 5.90 MIL/uL   Hemoglobin 11.3 (L) 13.0 - 18.0 g/dL   HCT 34.1 (L) 40.0 - 52.0 %   MCV 86.8 80.0 - 100.0 fL   MCH 28.8 26.0 - 34.0 pg   MCHC 33.2 32.0 - 36.0 g/dL   RDW 13.2 11.5 - 14.5 %   Platelets 272 150 - 440 K/uL  APTT     Status: Abnormal   Collection Time: 09/09/17  8:24 PM  Result Value  Ref Range   aPTT 127 (H) 24 - 36 seconds    Comment:        IF BASELINE aPTT IS ELEVATED, SUGGEST PATIENT RISK  ASSESSMENT BE USED TO DETERMINE APPROPRIATE ANTICOAGULANT THERAPY.   Protime-INR     Status: Abnormal   Collection Time: 09/09/17  8:24 PM  Result Value Ref Range   Prothrombin Time 27.8 (H) 11.4 - 15.2 seconds   INR 2.62   Urinalysis, Complete w Microscopic     Status: Abnormal   Collection Time: 09/09/17  9:00 PM  Result Value Ref Range   Color, Urine YELLOW (A) YELLOW   APPearance CLEAR (A) CLEAR   Specific Gravity, Urine 1.005 1.005 - 1.030   pH 6.0 5.0 - 8.0   Glucose, UA NEGATIVE NEGATIVE mg/dL   Hgb urine dipstick NEGATIVE NEGATIVE   Bilirubin Urine NEGATIVE NEGATIVE   Ketones, ur NEGATIVE NEGATIVE mg/dL   Protein, ur NEGATIVE NEGATIVE mg/dL   Nitrite NEGATIVE NEGATIVE   Leukocytes, UA NEGATIVE NEGATIVE   RBC / HPF 0-5 0 - 5 RBC/hpf   WBC, UA 0-5 0 - 5 WBC/hpf   Bacteria, UA NONE SEEN NONE SEEN   Squamous Epithelial / LPF 0-5 (A) NONE SEEN  Heparin level (unfractionated)     Status: Abnormal   Collection Time: 09/09/17 10:55 PM  Result Value Ref Range   Heparin Unfractionated 3.04 (H) 0.30 - 0.70 IU/mL    Comment: RESULTS CONFIRMED BY MANUAL DILUTION        IF HEPARIN RESULTS ARE BELOW EXPECTED VALUES, AND PATIENT DOSAGE HAS BEEN CONFIRMED, SUGGEST FOLLOW UP TESTING OF ANTITHROMBIN III LEVELS.   Basic metabolic panel     Status: Abnormal   Collection Time: 09/10/17  5:23 AM  Result Value Ref Range   Sodium 130 (L) 135 - 145 mmol/L   Potassium 3.0 (L) 3.5 - 5.1 mmol/L   Chloride 102 101 - 111 mmol/L   CO2 23 22 - 32 mmol/L   Glucose, Bld 84 65 - 99 mg/dL   BUN 10 6 - 20 mg/dL   Creatinine, Ser 0.73 0.61 - 1.24 mg/dL   Calcium 6.7 (L) 8.9 - 10.3 mg/dL   GFR calc non Af Amer >60 >60 mL/min   GFR calc Af Amer >60 >60 mL/min    Comment: (NOTE) The eGFR has been calculated using the CKD EPI equation. This calculation has not been validated in all clinical situations. eGFR's persistently <60 mL/min signify possible Chronic Kidney Disease.    Anion gap 5 5 -  15  CBC     Status: Abnormal   Collection Time: 09/10/17  5:23 AM  Result Value Ref Range   WBC 7.4 3.8 - 10.6 K/uL   RBC 3.20 (L) 4.40 - 5.90 MIL/uL   Hemoglobin 9.5 (L) 13.0 - 18.0 g/dL   HCT 28.0 (L) 40.0 - 52.0 %   MCV 87.3 80.0 - 100.0 fL   MCH 29.6 26.0 - 34.0 pg   MCHC 33.9 32.0 - 36.0 g/dL   RDW 13.1 11.5 - 14.5 %   Platelets 186 150 - 440 K/uL  APTT     Status: Abnormal   Collection Time: 09/10/17  5:23 AM  Result Value Ref Range   aPTT >160 (HH) 24 - 36 seconds    Comment:        IF BASELINE aPTT IS ELEVATED, SUGGEST PATIENT RISK ASSESSMENT BE USED TO DETERMINE APPROPRIATE ANTICOAGULANT THERAPY. RESULT REPEATED AND VERIFIED   Heparin level (unfractionated)  Status: Abnormal   Collection Time: 09/10/17  5:23 AM  Result Value Ref Range   Heparin Unfractionated 2.46 (H) 0.30 - 0.70 IU/mL    Comment: RESULT REPEATED AND VERIFIED RESULTS CONFIRMED BY MANUAL DILUTION        IF HEPARIN RESULTS ARE BELOW EXPECTED VALUES, AND PATIENT DOSAGE HAS BEEN CONFIRMED, SUGGEST FOLLOW UP TESTING OF ANTITHROMBIN III LEVELS.   Glucose, capillary     Status: None   Collection Time: 09/10/17  7:52 AM  Result Value Ref Range   Glucose-Capillary 98 65 - 99 mg/dL   No components found for: ESR, C REACTIVE PROTEIN MICRO: No results found for this or any previous visit (from the past 720 hour(s)).  IMAGING: Dg Foot Complete Left  Result Date: 09/09/2017 CLINICAL DATA:  71 year old male with history of pain in the left foot. Unable to stand and bear week due to infection of the left foot. EXAM: LEFT FOOT - COMPLETE 3+ VIEW COMPARISON:  No priors. FINDINGS: There is no evidence of fracture or dislocation. There is no evidence of arthropathy. Subtle lucency in the inferior aspect of the calcaneus. Soft tissues are unremarkable. IMPRESSION: 1. Subtle lucency in the inferior aspect of the calcaneus, which could suggest a focus of osteomyelitis. This could be further evaluated with MRI  of the foot with and without IV gadolinium if clinically appropriate. Electronically Signed   By: Vinnie Langton M.D.   On: 09/09/2017 16:47    Assessment:   Stanley Nguyen is a 71 y.o. male with L foot gangrene while also being treated for R foot osteomyelitis.  Prior cx with MSSA and Strep anginosis.  Recommendations Start unasyn  Vascular and surgery consult  Thank you very much for allowing me to participate in the care of this patient. Please call with questions.   Cheral Marker. Ola Spurr, MD

## 2017-09-10 NOTE — Consult Note (Signed)
ORTHOPAEDIC CONSULTATION REQUESTING PHYSICIAN: [place] SirenSainani, Vivek J, MD  Chief Complaint: Left foot pain  HPI: Stanley Nguyen is a 71 y.o. male who complains of  Recent worsening left foot pain. Known to me and underwent recent right fot debridment with wound vac placement.  Developed worsening pain to left foot recentl.   Seen in ED and noted to have gangrenous left great toe.  Past Medical History:  Diagnosis Date  . Atrial fibrillation ([facility])   . CAD (coronary artery disease)   . COPD (chronic obstructive pulmonary disease) ([facility])   . DM (diabetes mellitus) ([facility])    diet controlled  . Liver cirrhosis ([facility])   . Liver cirrhosis ([facility])   . Seizure disorder Stanley Nguyen([facility])    History reviewed. No pertinent surgical history. Social History   Socioeconomic History  . Marital status: Married    Spouse name: None  . Number of children: None  . Years of education: None  . Highest education level: None  Social Needs  . Financial resource strain: None  . Food insecurity - worry: None  . Food insecurity - inability: None  . Transportation needs - medical: None  . Transportation needs - non-medical: None  Occupational History  . None  Tobacco Use  . Smoking status: Current Every Day Smoker    Packs/day: 1.00    Types: Cigarettes  . Smokeless tobacco: Never Used  Substance and Sexual Activity  . Alcohol use: No  . Drug use: No  . Sexual activity: None  Other Topics Concern  . None  Social History Narrative  . None   History reviewed. No pertinent family history. Allergies  Allergen Reactions  . Keppra [Levetiracetam] Rash   Prior to Admission medications   Medication Sig Start Date End Date Taking? Authorizing Provider  amLODipine (NORVASC) 10 MG tablet Take 1 tablet (10 mg total) by mouth daily. 07/19/17  Yes Mody, Patricia PesaSital, MD  busPIRone (BUSPAR) 7.5 MG tablet Take 7.5 mg by mouth 2 (two) times daily. 07/09/17  Yes [provider]  gabapentin (NEURONTIN) 300 MG capsule Take 300 mg  by mouth 3 (three) times daily. 07/09/17  Yes [provider]  lisinopril (PRINIVIL,ZESTRIL) 10 MG tablet Take 1 tablet (10 mg total) by mouth daily. 07/19/17  Yes Mody, Patricia PesaSital, MD  nicotine (NICODERM CQ - DOSED IN MG/24 HOURS) 21 mg/24hr patch Place 1 patch (21 mg total) onto the skin daily. 07/19/17  Yes Mody, Sital, MD  OXYCONTIN 30 MG 12 hr tablet Take 1 tablet by mouth every 8 (eight) hours. 07/19/17  Yes Mody, Patricia PesaSital, MD  polyethylene glycol (MIRALAX / GLYCOLAX) packet Take 17 g by mouth daily. 07/19/17  Yes Adrian SaranMody, Sital, MD  Potassium Chloride ER 20 MEQ TBCR Take 1 tablet daily by mouth.   Yes [provider]  sotalol (BETAPACE) 80 MG tablet Take 80 mg by mouth daily. 07/02/17  Yes [provider]  SPIRIVA HANDIHALER 18 MCG inhalation capsule Place 1 capsule into inhaler and inhale daily. 07/03/17  Yes [provider]  VIMPAT 50 MG TABS tablet Take 50 mg by mouth 2 (two) times daily. 07/02/17  Yes [provider]  XARELTO 20 MG TABS tablet Take 20 mg by mouth daily. 06/24/17  Yes [provider]  zolpidem (AMBIEN) 5 MG tablet Take 1 tablet (5 mg total) by mouth at bedtime as needed for sleep. 07/19/17  Yes Adrian SaranMody, Sital, MD   Dg Foot Complete Left  Result Date: 09/09/2017 CLINICAL DATA:  71 year old male with history  of pain in the left foot. Unable to stand and bear week due to infection of the left foot. EXAM: LEFT FOOT - COMPLETE 3+ VIEW COMPARISON:  No priors. FINDINGS: There is no evidence of fracture or dislocation. There is no evidence of arthropathy. Subtle lucency in the inferior aspect of the calcaneus. Soft tissues are unremarkable. IMPRESSION: 1. Subtle lucency in the inferior aspect of the calcaneus, which could suggest a focus of osteomyelitis. This could be further evaluated with MRI of the foot with and without IV gadolinium if clinically appropriate. Electronically Signed   By: Trudie Reedaniel  Entrikin M.D.   On: 09/09/2017 16:47    Positive ROS:  All other systems have been reviewed and were otherwise negative with the exception of those mentioned in the HPI and as above.  12 point ROS was performed.  Physical Exam: General: Alert and oriented.  No apparent distress.  Vascular:  Left foot:Dorsalis Pedis:  absent Posterior Tibial:  absent  Right foot: unable to asses 2ndary to wound vac.  Neuro:intact sensation  Derm:Right foot wound vac in place.  Not removed.  No surrounding erythema or ulcers  Left foot with cyanotic great toe with severe pain.   No purulence or lymphangitic streaking.  Ortho/MS: No edema   Assessment: Gangrenous left great. Osteomyelitis right foot  Plan: Wound vac in place on right foot.  C/W vac.  Left foot with early gangrenous changes and cyanosis. Will require vascular evaluation. Scheduled for tomorrow. Will likely require great toe amputation but may need to wait for demarcation.  Will f/u after revascularization.    Irean HongFowler, Asherah Lavoy A, DPM Cell [phone]   09/10/2017 1:57 PM

## 2017-09-10 NOTE — Consult Note (Signed)
ANTICOAGULATION CONSULT NOTE - Follow Up Consult  Pharmacy Consult for heparin drip Indication: PVD w/ toe ischemia  Allergies  Allergen Reactions  . Keppra [Levetiracetam] Rash    Patient Measurements: Height: 5\' 10"  (177.8 cm) Weight: 156 lb (70.8 kg) IBW/kg (Calculated) : 73 Heparin Dosing Weight: 69.1kg  Vital Signs: Temp: 98.5 F (36.9 C) (11/13 0512) Temp Source: Oral (11/13 0512) BP: 109/47 (11/13 0512) Pulse Rate: 66 (11/13 0512)  Labs: Recent Labs    09/09/17 1206 09/09/17 2024 09/09/17 2255 09/10/17 0523  HGB 11.2* 11.3*  --  9.5*  HCT 32.9* 34.1*  --  28.0*  PLT 302 272  --  186  APTT  --  127*  --  >160*  LABPROT  --  27.8*  --   --   INR  --  2.62  --   --   HEPARINUNFRC  --   --  3.04* 2.46*  CREATININE 0.75  --   --  0.73    Estimated Creatinine Clearance: 86 mL/min (by C-G formula based on SCr of 0.73 mg/dL).   Medications:  Scheduled:  . amLODipine  10 mg Oral Daily  . aspirin EC  81 mg Oral Daily  . busPIRone  7.5 mg Oral BID  . celecoxib  100 mg Oral Daily  . docusate sodium  100 mg Oral BID  . gabapentin  300 mg Oral TID  . lacosamide  50 mg Oral BID  . lisinopril  10 mg Oral Daily  . nicotine  21 mg Transdermal Daily  . oxyCODONE  30 mg Oral Q8H  . sotalol  80 mg Oral Daily  . tiotropium  1 capsule Inhalation Daily    Assessment: Patient is a 71 year old male with PVD and progressing left foot necrosis. Pt on Xarelto at home. Therefore need to dose off of APTT until HL and APTT corollate. Last Xarelto dose was on 11/12 @ 0800. Pt for angiogram on wed   Goal of Therapy:  Heparin level 0.3-0.7 units/ml Monitor platelets by anticoagulation protocol: Yes   Plan:  APTT > 160. Called RN and asked her to told drip for 1hr. Will resume drip at 1000 at lower rate of 750units/hr. Recheck APTT @ 1600  Austin Herd D Aeneas Longsworth, Pharm.D, BCPS Clinical Pharmacist  09/10/2017,8:54 AM

## 2017-09-11 ENCOUNTER — Encounter: Payer: Self-pay | Admitting: Emergency Medicine

## 2017-09-11 ENCOUNTER — Encounter
Admission: EM | Disposition: A | Discharge status: Home Health | Payer: Self-pay | Source: Home / Self Care | Attending: Internal Medicine

## 2017-09-11 DIAGNOSIS — I70262 Atherosclerosis of native arteries of extremities with gangrene, left leg: Secondary | ICD-10-CM

## 2017-09-11 HISTORY — PX: LOWER EXTREMITY ANGIOGRAPHY: CATH118251

## 2017-09-11 HISTORY — PX: LOWER EXTREMITY INTERVENTION: CATH118252

## 2017-09-11 LAB — BASIC METABOLIC PANEL
Anion gap: 8 (ref 5–15)
BUN: 11 mg/dL (ref 6–20)
CALCIUM: 7.7 mg/dL — AB (ref 8.9–10.3)
CO2: 23 mmol/L (ref 22–32)
CREATININE: 0.85 mg/dL (ref 0.61–1.24)
Chloride: 100 mmol/L — ABNORMAL LOW (ref 101–111)
GFR calc Af Amer: >60 mL/min (ref >60)
Glucose, Bld: 98 mg/dL (ref 65–99)
POTASSIUM: 3.2 mmol/L — AB (ref 3.5–5.1)
SODIUM: 131 mmol/L — AB (ref 135–145)

## 2017-09-11 LAB — CBC
HCT: 29.7 % — ABNORMAL LOW (ref 40.0–52.0)
Hemoglobin: 10.1 g/dL — ABNORMAL LOW (ref 13.0–18.0)
MCH: 29.8 pg (ref 26.0–34.0)
MCHC: 34.1 g/dL (ref 32.0–36.0)
MCV: 87.4 fL (ref 80.0–100.0)
PLATELETS: 182 10*3/uL (ref 150–440)
RBC: 3.4 MIL/uL — ABNORMAL LOW (ref 4.40–5.90)
RDW: 13.5 % (ref 11.5–14.5)
WBC: 6.6 10*3/uL (ref 3.8–10.6)

## 2017-09-11 LAB — HEPARIN LEVEL (UNFRACTIONATED)
HEPARIN UNFRACTIONATED: 1.02 [IU]/mL — AB (ref 0.30–0.70)
HEPARIN UNFRACTIONATED: 0.58 [IU]/mL (ref 0.30–0.70)
Heparin Unfractionated: 0.64 IU/mL (ref 0.30–0.70)

## 2017-09-11 LAB — FIBRINOGEN
FIBRINOGEN: 354 mg/dL (ref 210–475)
Fibrinogen: 386 mg/dL (ref 210–475)

## 2017-09-11 LAB — APTT: aPTT: 99 seconds — ABNORMAL HIGH (ref 24–36)

## 2017-09-11 LAB — GLUCOSE, CAPILLARY
Glucose-Capillary: 95 mg/dL (ref 65–99)
Glucose-Capillary: 86 mg/dL (ref 65–99)

## 2017-09-11 SURGERY — LOWER EXTREMITY ANGIOGRAPHY
Anesthesia: Moderate Sedation | Laterality: Left

## 2017-09-11 MED ORDER — ONDANSETRON HCL 4 MG/2ML IJ SOLN: 4.0000 mg | Freq: Four times a day (QID) | INTRAMUSCULAR | Status: DC | PRN

## 2017-09-11 MED ORDER — MIDAZOLAM HCL 2 MG/2ML IJ SOLN
INTRAMUSCULAR | Status: AC
  Filled 2017-09-11: qty 2

## 2017-09-11 MED ORDER — SODIUM CHLORIDE 0.9 % IV SOLN
1.0000 mg/h | INTRAVENOUS | Status: AC
  Administered 2017-09-11: 1 mg/h
  Filled 2017-09-11: qty 10

## 2017-09-11 MED ORDER — SODIUM CHLORIDE 0.9% FLUSH: 3.0000 mL | INTRAVENOUS | Status: DC | PRN

## 2017-09-11 MED ORDER — SODIUM CHLORIDE 0.9 % IV SOLN: INTRAVENOUS | Status: DC

## 2017-09-11 MED ORDER — HYDROMORPHONE HCL 1 MG/ML IJ SOLN
1.0000 mg | Freq: Once | INTRAMUSCULAR | Status: AC
  Administered 2017-09-11: 1 mg via INTRAVENOUS

## 2017-09-11 MED ORDER — SODIUM CHLORIDE 0.9 % IV SOLN
250.0000 mL | INTRAVENOUS | Status: DC | PRN
  Administered 2017-09-13: 08:00:00 via INTRAVENOUS

## 2017-09-11 MED ORDER — MIDAZOLAM HCL 2 MG/2ML IJ SOLN
INTRAMUSCULAR | Status: AC
Start: 2017-09-11 — End: ?
  Filled 2017-09-11: qty 4

## 2017-09-11 MED ORDER — MIDAZOLAM HCL 2 MG/2ML IJ SOLN
1.0000 mg | INTRAMUSCULAR | Status: DC | PRN
  Administered 2017-09-12: 1 mg via INTRAVENOUS
  Administered 2017-09-13: 2 mg via INTRAVENOUS
  Filled 2017-09-11: qty 2

## 2017-09-11 MED ORDER — ALTEPLASE 2 MG IJ SOLR
INTRAMUSCULAR | Status: AC
  Filled 2017-09-11: qty 4

## 2017-09-11 MED ORDER — MIDAZOLAM HCL 2 MG/2ML IJ SOLN
INTRAMUSCULAR | Status: DC | PRN
  Administered 2017-09-11 (×3): 2 mg via INTRAVENOUS

## 2017-09-11 MED ORDER — LIDOCAINE-EPINEPHRINE (PF) 2 %-1:200000 IJ SOLN
INTRAMUSCULAR | Status: DC | PRN
  Administered 2017-09-11: 10 mL

## 2017-09-11 MED ORDER — LIDOCAINE-EPINEPHRINE (PF) 1 %-1:200000 IJ SOLN
INTRAMUSCULAR | Status: AC
  Filled 2017-09-11: qty 30

## 2017-09-11 MED ORDER — FENTANYL CITRATE (PF) 100 MCG/2ML IJ SOLN
INTRAMUSCULAR | Status: DC | PRN
  Administered 2017-09-11 (×4): 50 ug via INTRAVENOUS

## 2017-09-11 MED ORDER — HYDROMORPHONE HCL 1 MG/ML IJ SOLN
INTRAMUSCULAR | Status: AC
  Filled 2017-09-11: qty 1

## 2017-09-11 MED ORDER — FENTANYL CITRATE (PF) 100 MCG/2ML IJ SOLN
INTRAMUSCULAR | Status: AC
  Filled 2017-09-11: qty 2

## 2017-09-11 MED ORDER — HEPARIN (PORCINE) IN NACL 100-0.45 UNIT/ML-% IJ SOLN
600.0000 [IU]/h | INTRAMUSCULAR | Status: DC
  Administered 2017-09-12: 600 [IU]/h via INTRAVENOUS
  Filled 2017-09-11: qty 250

## 2017-09-11 MED ORDER — MORPHINE SULFATE (PF) 4 MG/ML IV SOLN
5.0000 mg | INTRAVENOUS | Status: DC | PRN
  Administered 2017-09-12 (×3): 5 mg via INTRAVENOUS
  Filled 2017-09-11 (×3): qty 2

## 2017-09-11 MED ORDER — HEPARIN SODIUM (PORCINE) 1000 UNIT/ML IJ SOLN
INTRAMUSCULAR | Status: AC
  Filled 2017-09-11: qty 1

## 2017-09-11 MED ORDER — IOPAMIDOL (ISOVUE-300) INJECTION 61%
INTRAVENOUS | Status: DC | PRN
  Administered 2017-09-11: 55 mL via INTRAVENOUS

## 2017-09-11 MED ORDER — ALTEPLASE 2 MG IJ SOLR
INTRAMUSCULAR | Status: DC | PRN
  Administered 2017-09-11: 4 mg

## 2017-09-11 MED ORDER — SODIUM CHLORIDE 0.9 % IV SOLN
0.5000 mg/h | INTRAVENOUS | Status: DC
  Administered 2017-09-12 – 2017-09-13 (×2): 0.5 mg/h
  Filled 2017-09-11 (×5): qty 10

## 2017-09-11 MED ORDER — DEXMEDETOMIDINE HCL IN NACL 400 MCG/100ML IV SOLN
0.4000 ug/kg/h | INTRAVENOUS | Status: DC
  Administered 2017-09-11: 0.4 ug/kg/h via INTRAVENOUS

## 2017-09-11 MED ORDER — SODIUM CHLORIDE 0.9% FLUSH
3.0000 mL | Freq: Two times a day (BID) | INTRAVENOUS | Status: DC
  Administered 2017-09-11 – 2017-09-16 (×8): 3 mL via INTRAVENOUS

## 2017-09-11 MED ORDER — HEPARIN (PORCINE) IN NACL 2-0.9 UNIT/ML-% IJ SOLN
INTRAMUSCULAR | Status: AC
  Filled 2017-09-11: qty 1000

## 2017-09-11 MED ORDER — SODIUM CHLORIDE 0.9 % IV SOLN: 1.0000 mg/h | INTRAVENOUS | Status: DC

## 2017-09-11 MED ORDER — HEPARIN SODIUM (PORCINE) 1000 UNIT/ML IJ SOLN
INTRAMUSCULAR | Status: DC | PRN
  Administered 2017-09-11: 4000 [IU] via INTRAVENOUS

## 2017-09-11 SURGICAL SUPPLY — 13 items
CATH BEACON 5 .038 100 VERT TP (CATHETERS) ×4 IMPLANT
CATH IMAGER II S 5FR 65CM (MISCELLANEOUS) ×4 IMPLANT
CATH INFUS 90CMX50CM (CATHETERS) ×2
CATH INFUS UNIFUSE 90X50 5FR (CATHETERS) ×2 IMPLANT
DEVICE TORQUE (MISCELLANEOUS) ×4 IMPLANT
GLIDEWIRE ADV .035X260CM (WIRE) ×4 IMPLANT
KIT CATH CVC 3 LUMEN 7FR 8IN (MISCELLANEOUS) ×4 IMPLANT
PACK ANGIOGRAPHY (CUSTOM PROCEDURE TRAY) ×4 IMPLANT
SHEATH BRITE TIP 5FRX11 (SHEATH) ×4 IMPLANT
SHEATH BRITE TIP 6FR X 23 (SHEATH) ×4 IMPLANT
SUT SILK 0 FSL (SUTURE) ×4 IMPLANT
TUBING CONTRAST HIGH PRESS 72 (TUBING) ×4 IMPLANT
WIRE J 3MM .035X145CM (WIRE) ×4 IMPLANT

## 2017-09-11 NOTE — Progress Notes (Signed)
[facility] CLINIC INFECTIOUS DISEASE PROGRESS NOTE Date of Admission:  09/09/2017     ID: Stanley Nguyen is a 71 y.o. male with L foot and toe gangrene, R foot osteomyelitis Active Problems:   Gangrene of toe of left foot (HCC)   Subjective: No fevers  ROS  Eleven systems are reviewed and negative except per hpi  Medications:  Antibiotics Given (last 72 hours)    Date/Time Action Medication Dose Rate   09/09/17 1819 New Bag/Given   piperacillin-tazobactam (ZOSYN) IVPB 3.375 g 3.375 g 100 mL/hr   09/09/17 1819 New Bag/Given   vancomycin (VANCOCIN) IVPB 1000 mg/200 mL premix 1,000 mg 200 mL/hr   09/10/17 1530 New Bag/Given   Ampicillin-Sulbactam (UNASYN) 3 g in sodium chloride 0.9 % 100 mL IVPB 3 g 200 mL/hr   09/10/17 2111 New Bag/Given   Ampicillin-Sulbactam (UNASYN) 3 g in sodium chloride 0.9 % 100 mL IVPB 3 g 200 mL/hr   09/11/17 0316 New Bag/Given   Ampicillin-Sulbactam (UNASYN) 3 g in sodium chloride 0.9 % 100 mL IVPB 3 g 200 mL/hr   09/11/17 1055 New Bag/Given  [forpre- op]   cefUROXime (ZINACEF) 1.5 g in dextrose 5 % 50 mL IVPB 1.5 g 100 mL/hr   09/11/17 1209 New Bag/Given   Ampicillin-Sulbactam (UNASYN) 3 g in sodium chloride 0.9 % 100 mL IVPB 3 g 200 mL/hr     . amLODipine  10 mg Oral Daily  . aspirin EC  81 mg Oral Daily  . busPIRone  7.5 mg Oral BID  . celecoxib  100 mg Oral Daily  . docusate sodium  100 mg Oral BID  . gabapentin  300 mg Oral TID  . lacosamide  50 mg Oral BID  . lisinopril  10 mg Oral Daily  . nicotine  21 mg Transdermal Daily  . oxyCODONE  30 mg Oral Q8H  . polyethylene glycol  17 g Oral Daily  . potassium chloride  20 mEq Oral Daily  . sotalol  80 mg Oral Daily  . tiotropium  1 capsule Inhalation Daily    Objective: Vital signs in last 24 hours: Temp:  [98 F (36.7 C)-98.4 F (36.9 C)] 98.2 F (36.8 C) (11/14 1321) Pulse Rate:  [61-65] 62 (11/14 1321) Resp:  [14-16] 14 (11/14 1321) BP: (119-158)/(56-64) 144/59 (11/14 1321) SpO2:   [96 %-97 %] 96 % (11/14 1321) Weight:  [69.4 kg (152 lb 14.4 oz)] 69.4 kg (152 lb 14.4 oz) (11/14 0457) Constitutional: He is oriented to person, place, and time.thin, frail  HENT: anicteric  Mouth/Throat: Oropharynx is clear and moist. No oropharyngeal exudate.  Cardiovascular: Normal rate, regular rhythm and normal heart sounds. Pulmonary/Chest: Effort normal and breath sounds normal. No respiratory distress. He has no wheezes.  Abdominal: Soft. Bowel sounds are normal. He exhibits no distension. There is no tenderness.  Lymphadenopathy: He has no cervical adenopathy.  Neurological: He is alert and oriented to person, place, and time.  Skin: Derm:The dorsal aspect of his right foot has wound vac L foot with gangrene of great toe Psychiatric: He has a normal mood and affect. His behavior is normal.     Lab Results Recent Labs    09/09/17 2024 09/10/17 0523 09/11/17 0012  WBC 13.1* 7.4  --   HGB 11.3* 9.5*  --   HCT 34.1* 28.0*  --   NA  --  130* 131*  K  --  3.0* 3.2*  CL  --  102 100*  CO2  --  23  23  BUN  --  10 11  CREATININE  --  0.73 0.85    Microbiology: Results for orders placed or performed during the hospital encounter of 07/10/17  Wound or Superficial Culture     Status: None   Collection Time: 07/10/17  4:30 PM  Result Value Ref Range Status   Specimen Description FOOT  Final   Special Requests NONE  Final   Gram Stain   Final    RARE WBC SEEN FEW GRAM POSITIVE COCCI Performed at [facility], [address] [place], [place] [address]    Culture MODERATE STAPHYLOCOCCUS AUREUS  Final   Report Status 07/13/2017 FINAL  Final   Organism ID, Bacteria STAPHYLOCOCCUS AUREUS  Final      Susceptibility   Staphylococcus aureus - MIC*    CIPROFLOXACIN 1 SENSITIVE Sensitive     ERYTHROMYCIN <=0.25 SENSITIVE Sensitive     GENTAMICIN <=0.5 SENSITIVE Sensitive     OXACILLIN 0.5 SENSITIVE Sensitive     TETRACYCLINE <=1 SENSITIVE Sensitive     VANCOMYCIN <=0.5  SENSITIVE Sensitive     TRIMETH/SULFA <=10 SENSITIVE Sensitive     CLINDAMYCIN <=0.25 SENSITIVE Sensitive     RIFAMPIN <=0.5 SENSITIVE Sensitive     Inducible Clindamycin NEGATIVE Sensitive     * MODERATE STAPHYLOCOCCUS AUREUS  Culture, blood (routine x 2)     Status: None   Collection Time: 07/10/17  4:30 PM  Result Value Ref Range Status   Specimen Description BLOOD BLOOD LEFT FOREARM  Final   Special Requests   Final    BOTTLES DRAWN AEROBIC AND ANAEROBIC Blood Culture results may not be optimal due to an excessive volume of blood received in culture bottles   Culture NO GROWTH 5 DAYS  Final   Report Status 07/15/2017 FINAL  Final  Culture, blood (routine x 2)     Status: None   Collection Time: 07/10/17  4:30 PM  Result Value Ref Range Status   Specimen Description BLOOD LEFT ANTECUBITAL  Final   Special Requests   Final    BOTTLES DRAWN AEROBIC AND ANAEROBIC Blood Culture results may not be optimal due to an excessive volume of blood received in culture bottles   Culture NO GROWTH 5 DAYS  Final   Report Status 07/15/2017 FINAL  Final  Surgical pcr screen     Status: Abnormal   Collection Time: 07/10/17  9:05 PM  Result Value Ref Range Status   MRSA, PCR NEGATIVE NEGATIVE Final   Staphylococcus aureus POSITIVE (A) NEGATIVE Final    Comment: (NOTE) The Xpert SA Assay (FDA approved for NASAL specimens in patients 70 years of age and older), is one component of a comprehensive surveillance program. It is not intended to diagnose infection nor to guide or monitor treatment.   Aerobic/Anaerobic Culture (surgical/deep wound)     Status: None   Collection Time: 07/12/17  9:57 AM  Result Value Ref Range Status   Specimen Description FOOT RIGHT first metatarsal SWAB  Final   Special Requests NONE  Final   Gram Stain   Final    FEW WBC PRESENT, PREDOMINANTLY PMN FEW GRAM POSITIVE COCCI IN CLUSTERS    Culture   Final    FEW STAPHYLOCOCCUS AUREUS FEW STREPTOCOCCUS ANGINOSIS SEE  STAPH AUREUS SUSCEPTIBILITIES IN RESULTS REVIEW / OTHER TESTS/ MISC LAB TEST SECTION FOR 08/05/17 Performed at [facility], [address] [place], [place] [address]    Report Status 08/05/2017 FINAL  Final  Organism ID, Bacteria STREPTOCOCCUS ANGINOSIS  Final      Susceptibility   Streptococcus anginosis - MIC*    PENICILLIN <=0.06 SENSITIVE Sensitive     CEFTRIAXONE 0.25 SENSITIVE Sensitive     ERYTHROMYCIN <=0.12 SENSITIVE Sensitive     LEVOFLOXACIN 0.5 SENSITIVE Sensitive     VANCOMYCIN 1 SENSITIVE Sensitive     * FEW STREPTOCOCCUS ANGINOSIS  Susceptibility, Aer + Anaerob     Status: None   Collection Time: 07/12/17  9:57 AM  Result Value Ref Range Status   Suscept, Aer + Anaerob Final report  Corrected    Comment: (NOTE) Performed At: [facility] [address] [place], [place] [id number] Mila HomerHancock William F MD IO:[id number]:[phone] CORRECTED ON 09/20 AT 1731: PREVIOUSLY REPORTED AS Preliminary report    Source of Sample WOUND  Final    Comment: STAPHYLOCOCCUS AUREUS Performed at [facility], [address] [place], [place] [address]   Susceptibility Result     Status: None   Collection Time: 07/12/17  9:57 AM  Result Value Ref Range Status   Suscept Result 1 Staphylococcus aureus  Final    Comment: (NOTE) Identification performed by account, not confirmed by this laboratory. Based on resistance to penicillin and susceptibility to oxacillin this isolate would be susceptible to: * Penicillinase-stable penicillins; such as:    Cloxacillin    Dicloxacillin    Nafcillin * Beta-lactam/beta-lactamase inhibitor combinations; such as:    Amoxicillin-clavulanic acid    Ampicillin-sulbactam * Antistaphylococcal cephems; such as:    Cefaclor    Cefuroxime * Antistaphylococcal carbapenems; such as:    Imipenem    Meropenem Clindamycin      S Erythromycin     S    Antimicrobial Suscept Comment  Final    Comment: (NOTE)      ** S = Susceptible; I =  Intermediate; R = Resistant **                   P = Positive; N = Negative            MICS are expressed in micrograms per mL   Antibiotic                 RSLT#1    RSLT#2    RSLT#3    RSLT#4 Ciprofloxacin                  S Clindamycin                    S Erythromycin                   S Gentamicin                     S Levofloxacin                   S Linezolid                      S Moxifloxacin                   S Nitrofurantoin                 S Oxacillin                      S Penicillin  R Quinupristin/Dalfopristin      S Rifampin                       S Tetracycline                   S Trimethoprim/Sulfa             S Vancomycin                     S Performed At: [facility] [address] [place], [place] [id number] Mila Homer MD GE:[id number] Performed at [facility], [address], [place], [place] [address]     Studies/Results: Dg Foot Complete Left  Result Date: 09/09/2017 CLINICAL DATA:  71 year old male with history of pain in the left foot. Unable to stand and bear week due to infection of the left foot. EXAM: LEFT FOOT - COMPLETE 3+ VIEW COMPARISON:  No priors. FINDINGS: There is no evidence of fracture or dislocation. There is no evidence of arthropathy. Subtle lucency in the inferior aspect of the calcaneus. Soft tissues are unremarkable. IMPRESSION: 1. Subtle lucency in the inferior aspect of the calcaneus, which could suggest a focus of osteomyelitis. This could be further evaluated with MRI of the foot with and without IV gadolinium if clinically appropriate. Electronically Signed   By: Trudie Reed M.D.   On: 09/09/2017 16:47    Assessment/Plan: Stanley Nguyen is a 71 y.o. male with L foot gangrene mainly of great toe while also being treated for R foot osteomyelitis.  Prior cx from R foot with MSSA and Strep anginosis and he had been treated with IV abx and recently changed to augmentin (was going to use keflex  in place of augmentin if he was improving). His R foot seems to be improving but L foot is main issue  Recommendations Continue unasyn  Vascular surgery and podiatry have seen. Will be getting angiogram and possible toe amputation.  Depending on degree of demarcation and viability of tissue hopefully will be able to treat with oral abx and doubt will need another picc.  Thank you very much for the consult. Will follow with you.  Mick Sell   09/11/2017, 2:26 PM

## 2017-09-11 NOTE — Op Note (Signed)
[facility]   PROCEDURE NOTE  PROCEDURE: 1. Right jugular triple lumen central venous catheter placement 2. Right jugular cannulation under ultrasound guidance  PRE-OPERATIVE DIAGNOSIS: ischemic leg  POST-OPERATIVE DIAGNOSIS: same as above  SURGEON: Festus BarrenJason Dew, MD  ANESTHESIA:  None  ESTIMATED BLOOD LOSS: minimal  FINDING(S): none  SPECIMEN(S):  none  INDICATIONS:   Stanley Nguyen is a 71 y.o. male who presents with need for venous access.  The patient presents for central venous catheter placement.  The patient is aware the risks of central venous catheter placement include but are not limited to: bleeding, infection, central venous injury, pneumothorax, possible venous stenosis, possible malpositioning in the venous system, and possible infections related to long-term catheter presence. The patient was aware of these risks and agreed to proceed.  DESCRIPTION: After written informed consent was obtained from the patient and/or family, the patient was placed supine in the hospital bed.  The patient was prepped with chloraprep and draped in the standard fashion for a chest or neck central venous catheter placement.  I anesthesized the neck cannulation site with 1% lidocaine then under ultrasound guidance, the right jugular vein was cannulated with the 18 gauge needle and a permanent image was recorded.  A J wire was then placed down in the superior vena cava.  After a skin nick and dilatation, the triple lumen central venous catheter was placed over the wire and the wire was removed.  Each port was aspirated and flushed with sterile normal saline.  The catheter was secured in placed with three interrupted stitches of 3-0 Silk tied to the catheter.  The catheter was dressed with sterile dressing.  Fluoroscopy was used to confirm the catheter tip was parked at the cavoatrial junction.  COMPLICATIONS: none apparent  CONDITION: stable  Festus BarrenJason Dew 09/11/2017, 5:07  PM     This note was created with Dragon Medical transcription system. Any errors in dictation are purely unintentional.

## 2017-09-11 NOTE — Consult Note (Signed)
WOC Nurse wound consult note Reason for Consult: Application NPWT to right anterior foot, present on admission.  HAS Medela device in place.  [facility].  Change Mon/Wed/Fri Wound type: VAscular wound to right foot.  Gangrenous changes on left foot.  Revascularization pending. LEft great toe is black, cool.  Second metatarsal is dusky, pending vascular intervention.   Pressure Injury POA: NA Measurement: 3 cm x 2.4 cm x 0.3 cm with 25% tendon visible in wound bed.  Wound bed:75% pale pink nongranulating Drainage (amount, consistency, odor) minimal serosanguinous  Musty odor Periwound: Dry skin Dressing procedure/placement/frequency:Cleanse wound to right  Foot with NS.  Apply silicone contact layer to wound bed.  COver with black foam and drape.  Seal immediately achieved. Change Mon/Wed/Friday.  Wife taking Medela (home) pump home with her and will bring back upon discharge.  WOC team will follow.  Maple HudsonKaren Yanni Quiroa RN BSN CWON Pager [phone]

## 2017-09-11 NOTE — Op Note (Signed)
[facility] Percutaneous Study/Intervention Procedural Note   Date of Surgery: 09/11/2017  Surgeon(s):DEW,JASON   Assistants:none  Pre-operative Diagnosis: PAD with gangrene LLE  Post-operative diagnosis: Same  Procedure(s) Performed: 1. Ultrasound guidance for vascular access right femoral artery 2. Catheter placement into left popliteal artery from right femoral approach 3. Aortogram and selective left lower extremity angiogram 4. Infusion of thrombolytic therapy with 4 mg of TPA to the left common and external iliac arteries as well as the common femoral artery, SFA, and popliteal artery 5. Placement of a 90 cm total length 50 cm working length thrombolytic therapy catheter for continued thrombolytic therapy    EBL: 5 cc  Contrast: 55 cc  Fluoro Time: 6.5 minutes  Moderate Conscious Sedation Time: approximately 45 minutes using 6 mg of Versed and 200 Mcg of Fentanyl  Indications: Patient is a 71 y.o.male with gangrene of the left foot with clear ischemic signs. The patient is brought in for angiography for further evaluation and potential treatment. Risks and benefits are discussed and informed consent is obtained  Procedure: The patient was identified and appropriate procedural time out was performed. The patient was then placed supine on the table and prepped and draped in the usual sterile fashion.Moderate conscious sedation was administered during a face to face encounter with the patient throughout the procedure with my supervision of the RN administering medicines and monitoring the patient's vital signs, pulse oximetry, telemetry and mental status throughout from the start of the procedure until the patient was taken to the recovery room. Ultrasound was used to evaluate the right common femoral artery. It was patent after previous endarterectomy. A digital  ultrasound image was acquired. A Seldinger needle was used to access the right common femoral artery under direct ultrasound guidance and a permanent image was performed. A 0.035 J wire was advanced without resistance and a 5Fr sheath was placed. Pigtail catheter was placed into the aorta and an AP aortogram was performed. This demonstrated normal renal arteries and normal aorta.  The right iliac artery had previously placed stents which were patent with no stenosis of greater than 20% identified.  The left iliac stents were occluded and there was clearly thrombus that extended up into the left common iliac artery.  There was then what appeared to be some reconstitution just below the femoral bifurcation. I then crossed the aortic bifurcation and advanced to the left femoral head with some difficulty, but advanced the catheter to the left femoral head. Selective left lower extremity angiogram was then performed. This demonstrated the common femoral artery was diseased with reconstitution of the distal common femoral artery and the origins of the profunda and superficial femoral arteries beyond the proximal portion of the superficial femoral artery, and normalized but at Hunter's canal there was what appeared to be near occlusive thrombus.  The above-knee popliteal artery then normalized but at the level of the popliteal artery there was occlusion with what appeared to be thrombus.  There was faint reconstitution of what appeared to be the posterior tibial and peroneal arteries distally. The patient was systemically heparinized and I felt our best chance at revascularization would be to run continuous thrombolytic therapy and hope that the thrombus burden would be decreased.  I then placed a 6 French 23 cm sheath to the aortic bifurcation and advanced a 90 cm total length 50 cm working length thrombolytic therapy catheter.  4 mg of TPA were instilled running from the distal common iliac artery down through the  external iliac artery, common femoral artery, superficial femoral artery, and above-knee popliteal artery.  The catheter was then secured in place with a silk suture as well as the sheath.  The patient was taken to the recovery room in stable condition having tolerated the procedure well.  Findings:  Aortogram: This demonstrated normal renal arteries and normal aorta.  The right iliac artery had previously placed stents which were patent with no stenosis of greater than 20% identified.  The left iliac stents were occluded and there was clearly thrombus that extended up into the left common iliac artery.  There was then what appeared to be some reconstitution just below the femoral bifurcation Left Lower Extremity:The common femoral artery was diseased with reconstitution of the distal common femoral artery and the origins of the profunda and superficial femoral arteries beyond the proximal portion of the superficial femoral artery, and normalized but at Hunter's canal there was what appeared to be near occlusive thrombus.  The above-knee popliteal artery then normalized but at the level of the popliteal artery there was occlusion with what appeared to be thrombus.  There was faint reconstitution of what appeared to be the posterior tibial and peroneal arteries distally.   Disposition: Patient was taken to the recovery room in stable condition having tolerated the procedure well.  Complications: None  Festus BarrenJason Dew 09/11/2017 4:06 PM   This note was created with Dragon Medical transcription system. Any errors in dictation are purely unintentional.

## 2017-09-11 NOTE — Progress Notes (Signed)
[employer] - [place] at [facility]   PATIENT NAME: Stanley Nguyen    MR#:  [id number]  DATE OF BIRTH:  1946/06/15  SUBJECTIVE:   Patient here due to left foot pain and noted to have a left great toe that was necrotic/gangrenous in nature. Plan for LLE angiogram today.  Wife at bedside.  No other acute events overnight.   REVIEW OF SYSTEMS:    Review of Systems  Constitutional: Negative for chills and fever.  HENT: Negative for congestion and tinnitus.   Eyes: Negative for blurred vision and double vision.  Respiratory: Negative for cough, shortness of breath and wheezing.   Cardiovascular: Negative for chest pain, orthopnea and PND.  Gastrointestinal: Negative for abdominal pain, diarrhea, nausea and vomiting.  Genitourinary: Negative for dysuria and hematuria.  Musculoskeletal: Positive for joint pain (left foot/great Toe. ).  Neurological: Negative for dizziness, sensory change and focal weakness.  All other systems reviewed and are negative.   Nutrition: Heart healthy Tolerating Diet: Yes Tolerating PT: Await Eval.    DRUG ALLERGIES:   Allergies  Allergen Reactions  . Keppra [Levetiracetam] Rash    VITALS:  Blood pressure (!) 144/59, pulse 62, temperature 98.2 F (36.8 C), temperature source Oral, resp. rate 14, height 5\' 10"  (1.778 m), weight 69.4 kg (152 lb 14.4 oz), SpO2 96 %.  PHYSICAL EXAMINATION:   Physical Exam  GENERAL:  71 y.o.-year-old patient lying in bed in mild distress with left foot pain.   EYES: Pupils equal, round, reactive to light and accommodation. No scleral icterus. Extraocular muscles intact.  HEENT: Head atraumatic, normocephalic. Oropharynx and nasopharynx clear.  NECK:  Supple, no jugular venous distention. No thyroid enlargement, no tenderness.  LUNGS: Normal breath sounds bilaterally, no wheezing, rales, rhonchi. No use of accessory muscles of respiration.  CARDIOVASCULAR: S1, S2 normal. No murmurs, rubs, or gallops.   ABDOMEN: Soft, nontender, nondistended. Bowel sounds present. No organomegaly or mass.  EXTREMITIES: Right foot with a wound VAC in place, left foot with a great toe that is necrotic in nature with cyanosis noted. Pulses are not palpable bilaterally, no edema.  NEUROLOGIC: Cranial nerves II through XII are intact. No focal Motor or sensory deficits b/l.   PSYCHIATRIC: The patient is alert and oriented x 3.  SKIN: No obvious rash, lesion, or ulcer.    LABORATORY PANEL:   CBC Recent Labs  Lab 09/10/17 0523  WBC 7.4  HGB 9.5*  HCT 28.0*  PLT 186   ------------------------------------------------------------------------------------------------------------------  Chemistries  Recent Labs  Lab 09/09/17 1206  09/11/17 0012  NA 125*   < > 131*  K 4.1   < > 3.2*  CL 90*   < > 100*  CO2 24   < > 23  GLUCOSE 100*   < > 98  BUN 8   < > 11  CREATININE 0.75   < > 0.85  CALCIUM 8.2*   < > 7.7*  AST 49*  --   --   ALT 23  --   --   ALKPHOS 106  --   --   BILITOT 0.8  --   --    < > = values in this interval not displayed.   ------------------------------------------------------------------------------------------------------------------  Cardiac Enzymes No results for input(s): TROPONINI in the last 168 hours. ------------------------------------------------------------------------------------------------------------------  RADIOLOGY:  Dg Foot Complete Left  Result Date: 09/09/2017 CLINICAL DATA:  71 year old male with history of pain in the left foot. Unable to stand and bear week due to infection  of the left foot. EXAM: LEFT FOOT - COMPLETE 3+ VIEW COMPARISON:  No priors. FINDINGS: There is no evidence of fracture or dislocation. There is no evidence of arthropathy. Subtle lucency in the inferior aspect of the calcaneus. Soft tissues are unremarkable. IMPRESSION: 1. Subtle lucency in the inferior aspect of the calcaneus, which could suggest a focus of osteomyelitis. This could be  further evaluated with MRI of the foot with and without IV gadolinium if clinically appropriate. Electronically Signed   By: Trudie Reedaniel  Entrikin M.D.   On: 09/09/2017 16:47     ASSESSMENT AND PLAN:   71 year old male with past medical history of seizure disorder, liver cirrhosis, diabetes, COPD, history of coronary artery disease, atrial fibrillation and presents to the hospital due to left foot pain and swelling.  1. Left foot/great toe gangrene - appreciate vascular surgery input and plan for angiogram today. -Continue empiric Unasyn, heparin drip. -Patient may likely need amputation and podiatry is following the patient and they are awaiting further input from vascular surgery first. - appreciate ID, Podiatry and vascular surgery input.   2. Essential hypertension-continue Norvasc, lisinopril.  3. History of atrial fibrillation-rate controlled. Continue sotalol. Continue heparin drip for now.   4. COPD-no acute exacerbation-continue Spiriva  6. History of seizures-continue Lacosamide.   7. Neuropathy-continue gabapentin.  8. Anxiety-continue BuSpar.  All the records are reviewed and case discussed with Care Management/Social Worker. Management plans discussed with the patient, family and they are in agreement.  CODE STATUS: Full code  DVT Prophylaxis: Heparin gtt  TOTAL TIME TAKING CARE OF THIS PATIENT: 25 minutes.   POSSIBLE D/C IN 2-3 DAYS, DEPENDING ON CLINICAL CONDITION.   [place] SirenSAINANI,Marek Nghiem J M.D on 09/11/2017 at 1:51 PM  Between 7am to 6pm - Pager - [phone]  After 6pm go to [email or web address] - Social research officer, governmentpassword EPAS [facility]  [facility] [place] [facility]  Office  [phone]  CC: Primary care physician; Sherron Mondayejan-Sie, S Ahmed, MD

## 2017-09-11 NOTE — H&P (Signed)
[facility] History & Physical Update  The patient was interviewed and re-examined.  The patient's previous History and Physical has been reviewed and is unchanged.  There is no change in the plan of care. We plan to proceed with the scheduled procedure.  Festus BarrenJason Dew, MD  09/11/2017, 2:55 PM

## 2017-09-11 NOTE — Progress Notes (Signed)
Pharmacy consult d/c'd due to Dr. Wyn Quakerew wanting the heparin gtt at a consistent rate of 600u/hour. No titration and no boluses. Per Dr. Wyn Quakerew. Pharmacy notified of same.

## 2017-09-11 NOTE — Consult Note (Signed)
ANTICOAGULATION CONSULT NOTE - Follow Up Consult  Pharmacy Consult for heparin drip Indication: PVD w/ toe ischemia  Allergies  Allergen Reactions  . Keppra [Levetiracetam] Rash    Patient Measurements: Height: 5\' 10"  (177.8 cm) Weight: 156 lb (70.8 kg) IBW/kg (Calculated) : 73 Heparin Dosing Weight: 69.1kg  Vital Signs: Temp: 98 F (36.7 C) (11/13 2221) Temp Source: Oral (11/13 2221) BP: 119/56 (11/13 2221) Pulse Rate: 61 (11/13 2221)  Labs: Recent Labs    09/09/17 1206  09/09/17 2024 09/09/17 2255 09/10/17 0523 09/10/17 1549 09/11/17 0012  HGB 11.2*  --  11.3*  --  9.5*  --   --   HCT 32.9*  --  34.1*  --  28.0*  --   --   PLT 302  --  272  --  186  --   --   APTT  --    < > 127*  --  >160* 95* 99*  LABPROT  --   --  27.8*  --   --   --   --   INR  --   --  2.62  --   --   --   --   HEPARINUNFRC  --   --   --  3.04* 2.46*  --  0.58  CREATININE 0.75  --   --   --  0.73  --  0.85   < > = values in this interval not displayed.    Estimated Creatinine Clearance: 81 mL/min (by C-G formula based on SCr of 0.85 mg/dL).   Medications:  Scheduled:  . amLODipine  10 mg Oral Daily  . aspirin EC  81 mg Oral Daily  . busPIRone  7.5 mg Oral BID  . celecoxib  100 mg Oral Daily  . docusate sodium  100 mg Oral BID  . gabapentin  300 mg Oral TID  . lacosamide  50 mg Oral BID  . lisinopril  10 mg Oral Daily  . nicotine  21 mg Transdermal Daily  . oxyCODONE  30 mg Oral Q8H  . polyethylene glycol  17 g Oral Daily  . potassium chloride  20 mEq Oral Daily  . sotalol  80 mg Oral Daily  . tiotropium  1 capsule Inhalation Daily    Assessment: Patient is a 71 year old male with PVD and progressing left foot necrosis. Pt on Xarelto at home. Therefore need to dose off of APTT until HL and APTT corollate. Last Xarelto dose was on 11/12 @ 0800. Pt for angiogram on wed   Goal of Therapy:  Heparin level 0.3-0.7 units/ml Monitor platelets by anticoagulation protocol: Yes    Plan:  APTT > 160. Called RN and asked her to told drip for 1hr. Will resume drip at 1000 at lower rate of 750units/hr. Recheck APTT @ 1600   11/14 0015 aPTT 99, heparin level 0.58. Continue current regimen. Since levels are now correlating in therapeutic range will follow and adjust by heparin levels only. Next heparin level and CBC with tomorrow AM labs.  Fulton ReekMatt Gabriella Woodhead, PharmD, BCPS  09/11/17 12:59 AM

## 2017-09-12 ENCOUNTER — Encounter: Payer: Self-pay | Admitting: Vascular Surgery

## 2017-09-12 DIAGNOSIS — I70262 Atherosclerosis of native arteries of extremities with gangrene, left leg: Secondary | ICD-10-CM

## 2017-09-12 LAB — CBC
HCT: 29.8 % — ABNORMAL LOW (ref 40.0–52.0)
HCT: 33.1 % — ABNORMAL LOW (ref 40.0–52.0)
Hemoglobin: 10.2 g/dL — ABNORMAL LOW (ref 13.0–18.0)
Hemoglobin: 11.1 g/dL — ABNORMAL LOW (ref 13.0–18.0)
MCH: 30.1 pg (ref 26.0–34.0)
MCH: 29.5 pg (ref 26.0–34.0)
MCHC: 34.3 g/dL (ref 32.0–36.0)
MCHC: 33.6 g/dL (ref 32.0–36.0)
MCV: 87.6 fL (ref 80.0–100.0)
MCV: 88 fL (ref 80.0–100.0)
PLATELETS: 196 10*3/uL (ref 150–440)
Platelets: 186 10*3/uL (ref 150–440)
RBC: 3.4 MIL/uL — ABNORMAL LOW (ref 4.40–5.90)
RBC: 3.76 MIL/uL — ABNORMAL LOW (ref 4.40–5.90)
RDW: 13.2 % (ref 11.5–14.5)
RDW: 13.3 % (ref 11.5–14.5)
WBC: 6.8 10*3/uL (ref 3.8–10.6)
WBC: 8.8 10*3/uL (ref 3.8–10.6)

## 2017-09-12 LAB — FIBRINOGEN
FIBRINOGEN: 405 mg/dL (ref 210–475)
FIBRINOGEN: 408 mg/dL (ref 210–475)

## 2017-09-12 LAB — HEPARIN LEVEL (UNFRACTIONATED)
HEPARIN UNFRACTIONATED: 0.45 [IU]/mL (ref 0.30–0.70)
HEPARIN UNFRACTIONATED: 0.41 [IU]/mL (ref 0.30–0.70)

## 2017-09-12 MED ORDER — HYDROMORPHONE HCL 1 MG/ML IJ SOLN
1.0000 mg | INTRAMUSCULAR | Status: DC | PRN
  Administered 2017-09-12 – 2017-09-14 (×15): 1 mg via INTRAVENOUS
  Filled 2017-09-12 (×15): qty 1

## 2017-09-12 MED ORDER — SODIUM CHLORIDE 0.9 % IV SOLN: 1.0000 mg/h | INTRAVENOUS | Status: DC | PRN

## 2017-09-12 MED ORDER — POTASSIUM CHLORIDE 2 MEQ/ML IV SOLN
INTRAVENOUS | Status: DC
  Administered 2017-09-12 – 2017-09-17 (×4): via INTRAVENOUS
  Filled 2017-09-12 (×7): qty 1000

## 2017-09-12 NOTE — Consult Note (Signed)
ANTICOAGULATION CONSULT NOTE - Follow Up Consult  Pharmacy Consult for heparin drip Indication: PVD w/ toe ischemia  Allergies  Allergen Reactions  . Keppra [Levetiracetam] Rash    Patient Measurements: Height: 5\' 10"  (177.8 cm) Weight: 149 lb 14.6 oz (68 kg) IBW/kg (Calculated) : 73 Heparin Dosing Weight: 69.1kg  Vital Signs: Temp: 97.5 F (36.4 C) (11/15 0800) Temp Source: Axillary (11/15 0800) BP: 142/63 (11/15 1400) Pulse Rate: 67 (11/15 1400)  Labs: Recent Labs    09/09/17 2024  09/10/17 0523 09/10/17 1549 09/11/17 0012  09/11/17 2308 09/12/17 0502 09/12/17 0945  HGB 11.3*  --  9.5*  --   --   --  10.1* 10.2* 11.1*  HCT 34.1*  --  28.0*  --   --   --  29.7* 29.8* 33.1*  PLT 272  --  186  --   --   --  182 196 186  APTT 127*  --  >160* 95* 99*  --   --   --   --   LABPROT 27.8*  --   --   --   --   --   --   --   --   INR 2.62  --   --   --   --   --   --   --   --   HEPARINUNFRC  --    < > 2.46*  --  0.58   < > 0.64 0.45 0.41  CREATININE  --   --  0.73  --  0.85  --   --   --   --    < > = values in this interval not displayed.    Estimated Creatinine Clearance: 77.8 mL/min (by C-G formula based on SCr of 0.85 mg/dL).   Medications:  Scheduled:  . amLODipine  10 mg Oral Daily  . aspirin EC  81 mg Oral Daily  . busPIRone  7.5 mg Oral BID  . celecoxib  100 mg Oral Daily  . docusate sodium  100 mg Oral BID  . gabapentin  300 mg Oral TID  . lacosamide  50 mg Oral BID  . lisinopril  10 mg Oral Daily  . nicotine  21 mg Transdermal Daily  . oxyCODONE  30 mg Oral Q8H  . polyethylene glycol  17 g Oral Daily  . potassium chloride  20 mEq Oral Daily  . sodium chloride flush  3 mL Intravenous Q12H  . sotalol  80 mg Oral Daily  . tiotropium  1 capsule Inhalation Daily    Assessment: Patient is a 71 year old male with PVD and progressing left foot necrosis. Pt on Xarelto at home. Therefore need to dose off of APTT until HL and APTT corollate. Last Xarelto  dose was on 11/12 @ 0800. Pt for angiogram on wed   Goal of Therapy:  Heparin level 0.3-0.7 units/ml Monitor platelets by anticoagulation protocol: Yes   Plan:   Per MD, he is managing heparin drip and does not want bolusing or changes in rate.   Pharmacy will order labs as indicated by policy, but will make no adjustments.   Yolanda BonineHannah Farhiya Rosten, PharmD Pharmacy Resident 09/12/17 3:14 PM

## 2017-09-12 NOTE — Progress Notes (Signed)
[facility]  Daily Progress Note   Subjective  - 1 Day Post-Op  Still with left foot pain.  Foot is certainly warmer.  Was scheduled to go down for an angiogram this afternoon but he was fed lunch by the ICU.  Given difficulties with motion he needs deep sedation and that is not going to be possible today.  Objective Vitals:   09/12/17 1500 09/12/17 1515 09/12/17 1530 09/12/17 1600  BP: (!) 130/52   (!) 150/61  Pulse: 63 63 62 63  Resp: 14 16 12  (!) 9  Temp:      TempSrc:      SpO2: 94% 98% 96% 97%  Weight:      Height:        Intake/Output Summary (Last 24 hours) at 09/12/2017 1632 Last data filed at 09/12/2017 1600 Gross per 24 hour  Intake 2474.81 ml  Output 1935 ml  Net 539.81 ml    PULM  CTAB CV  RRR VASC  left foot warm.  No palpable pedal pulses.  Left great toe frankly gangrenous with marginal tissue in the forefoot.  Laboratory CBC    Component Value Date/Time   WBC 8.8 09/12/2017 0945   HGB 11.1 (L) 09/12/2017 0945   HGB 8.1 (L) 08/04/2013 0333   HCT 33.1 (L) 09/12/2017 0945   HCT 24.9 (L) 08/03/2013 0456   PLT 186 09/12/2017 0945   PLT 142 (L) 08/03/2013 0456    BMET    Component Value Date/Time   NA 131 (L) 09/11/2017 0012   NA 132 (L) 08/04/2013 0333   K 3.2 (L) 09/11/2017 0012   K 4.4 08/04/2013 0333   CL 100 (L) 09/11/2017 0012   CL 100 08/04/2013 0333   CO2 23 09/11/2017 0012   CO2 27 08/04/2013 0333   GLUCOSE 98 09/11/2017 0012   GLUCOSE 69 08/04/2013 0333   BUN 11 09/11/2017 0012   BUN 3 (L) 08/04/2013 0333   CREATININE 0.85 09/11/2017 0012   CREATININE 0.87 08/04/2013 0333   CALCIUM 7.7 (L) 09/11/2017 0012   CALCIUM 7.6 (L) 08/04/2013 0333   GFRNONAA >60 09/11/2017 0012   GFRNONAA >60 08/04/2013 0333   GFRAA >60 09/11/2017 0012   GFRAA >60 08/04/2013 [id number]    Assessment/Planning: POD #1 s/p left lower extremity angiogram   Continue thrombolytic therapy overnight and go back for repeat angiogram  tomorrow morning since he was fed lunch today.  Continue to monitor CBC and fibrinogen  N.p.o. after midnight please      Festus BarrenJason Kyeshia Zinn  09/12/2017, 4:32 PM

## 2017-09-12 NOTE — Plan of Care (Signed)
  Progressing Education: Knowledge of General Education information will improve 09/12/2017 0049 - Progressing by Dena Billet, RN Clinical Measurements: Will remain free from infection 09/12/2017 0049 - Progressing by Dena Billet, RN Coping: Level of anxiety will decrease 09/12/2017 0049 - Progressing by Dena Billet, RN Pain Managment: General experience of comfort will improve 09/12/2017 0049 - Progressing by Dena Billet, RN   Not Met (add Reason) Activity: Risk for activity intolerance will decrease 09/12/2017 0049 - Not Met (add Reason) by Dena Billet, RN Note Pt is on bed rest for R femoral lysis catheter

## 2017-09-12 NOTE — Progress Notes (Signed)
[employer] -  at [facility]   PATIENT NAME: Stanley CairoBruce Jackowski    MR#:  [id number]  DATE OF BIRTH:  1946/01/29  SUBJECTIVE:   Patient here due to left foot pain and noted tohave a left great toe that was necrotic/gangrenous in nature.  Post angiogram yesterday, found to have severe occlusion around, femoral artery, occlusion of contrast,, popliteal artery occlusion with thrombus, patient received thrombolytics during angiogram.  Right now on heparin drip.  REVIEW OF SYSTEMS:    Review of Systems  Constitutional: Negative for chills and fever.  HENT: Negative for congestion and tinnitus.   Eyes: Negative for blurred vision and double vision.  Respiratory: Negative for cough, shortness of breath and wheezing.   Cardiovascular: Negative for chest pain, orthopnea and PND.  Gastrointestinal: Negative for abdominal pain, diarrhea, nausea and vomiting.  Genitourinary: Negative for dysuria and hematuria.  Musculoskeletal: Positive for joint pain (left foot/great Toe. ).  Neurological: Negative for dizziness, sensory change and focal weakness.  All other systems reviewed and are negative.   Nutrition: Heart healthy Tolerating Diet: Yes Tolerating PT: Await Eval.    DRUG ALLERGIES:   Allergies  Allergen Reactions  . Keppra [Levetiracetam] Rash    VITALS:  Blood pressure (!) 142/63, pulse 67, temperature (!) 97.5 F (36.4 C), temperature source Axillary, resp. rate 14, height 5\' 10"  (1.778 m), weight 68 kg (149 lb 14.6 oz), SpO2 99 %.  PHYSICAL EXAMINATION:   Physical Exam  GENERAL:  71 y.o.-year-old patient lying in bed in mild distress with left foot pain.   EYES: Pupils equal, round, reactive to light and accommodation. No scleral icterus. Extraocular muscles intact.  HEENT: Head atraumatic, normocephalic. Oropharynx and nasopharynx clear.  NECK:  Supple, no jugular venous distention. No thyroid enlargement, no tenderness.  LUNGS: Normal breath sounds  bilaterally, no wheezing, rales, rhonchi. No use of accessory muscles of respiration.  CARDIOVASCULAR: S1, S2 normal. No murmurs, rubs, or gallops.  ABDOMEN: Soft, nontender, nondistended. Bowel sounds present. No organomegaly or mass.  EXTREMITIES: Right foot with a wound VAC in place, left foot with a great toe that is necrotic in nature with cyanosis noted. Pulses are not palpable bilaterally, no edema.  NEUROLOGIC: Cranial nerves II through XII are intact. No focal Motor or sensory deficits b/l.   PSYCHIATRIC: The patient is alert and oriented x 3.  SKIN: No obvious rash, lesion, or ulcer.    LABORATORY PANEL:   CBC Recent Labs  Lab 09/12/17 0945  WBC 8.8  HGB 11.1*  HCT 33.1*  PLT 186   ------------------------------------------------------------------------------------------------------------------  Chemistries  Recent Labs  Lab 09/09/17 1206  09/11/17 0012  NA 125*   < > 131*  K 4.1   < > 3.2*  CL 90*   < > 100*  CO2 24   < > 23  GLUCOSE 100*   < > 98  BUN 8   < > 11  CREATININE 0.75   < > 0.85  CALCIUM 8.2*   < > 7.7*  AST 49*  --   --   ALT 23  --   --   ALKPHOS 106  --   --   BILITOT 0.8  --   --    < > = values in this interval not displayed.   ------------------------------------------------------------------------------------------------------------------  Cardiac Enzymes No results for input(s): TROPONINI in the last 168 hours. ------------------------------------------------------------------------------------------------------------------  RADIOLOGY:  No results found.   ASSESSMENT AND PLAN:   71 year old male with past medical  history of seizure disorder, liver cirrhosis, diabetes, COPD, history of coronary artery disease, atrial fibrillation and presents to the hospital due to left foot pain and swelling.  1. Left foot/great toe gangrene - appreciate vascular surgery input, status post left leg angiogram yesterday.  Left iliac stents and also  thrombus extended to the left common iliac artery.  The patient is on heparin drip, continue heparin drip without titration of the dose, -Continue empiric Unasyn, heparin drip. -Patient may likely need amputation and podiatry is following the patient and they are awaiting further input from vascular surgery first. - appreciate ID, Podiatry and vascular surgery input.   2. Essential hypertension-continue Norvasc, lisinopril.  3. History of atrial fibrillation-rate controlled. Continue sotalol. Continue heparin drip for now.   4. COPD-no acute exacerbation-continue Spiriva  6. History of seizures-continue Lacosamide.   7. Neuropathy-continue gabapentin.  8. Anxiety-continue BuSpar.  All the records are reviewed and case discussed with Care Management/Social Worker. Management plans discussed with the patient, family and they are in agreement.  CODE STATUS: Full code  DVT Prophylaxis: Heparin gtt  TOTAL TIME TAKING CARE OF THIS PATIENT: 25 minutes.   POSSIBLE D/C IN 2-3 DAYS, DEPENDING ON CLINICAL CONDITION.   Katha HammingSnehalatha Shalaya Swailes M.D on 09/12/2017 at 2:23 PM  Between 7am to 6pm - Pager - [phone]  After 6pm go to [email or web address] - Social research officer, governmentpassword EPAS [facility]  [facility] [place] [facility]  Office  [phone]  CC: Primary care physician; Sherron Mondayejan-Sie, S Ahmed, MD

## 2017-09-13 ENCOUNTER — Inpatient Hospital Stay: Payer: Medicare HMO | Admitting: Anesthesiology

## 2017-09-13 ENCOUNTER — Encounter
Admission: EM | Disposition: A | Discharge status: Home Health | Payer: Self-pay | Source: Home / Self Care | Attending: Internal Medicine

## 2017-09-13 ENCOUNTER — Encounter: Payer: Self-pay | Admitting: Anesthesiology

## 2017-09-13 DIAGNOSIS — I70261 Atherosclerosis of native arteries of extremities with gangrene, right leg: Secondary | ICD-10-CM

## 2017-09-13 DIAGNOSIS — I70262 Atherosclerosis of native arteries of extremities with gangrene, left leg: Secondary | ICD-10-CM

## 2017-09-13 HISTORY — PX: LOWER EXTREMITY ANGIOGRAPHY: CATH118251

## 2017-09-13 LAB — BASIC METABOLIC PANEL
Anion gap: 8 (ref 5–15)
BUN: 7 mg/dL (ref 6–20)
CALCIUM: 7.4 mg/dL — AB (ref 8.9–10.3)
CO2: 25 mmol/L (ref 22–32)
Chloride: 99 mmol/L — ABNORMAL LOW (ref 101–111)
Creatinine, Ser: 0.61 mg/dL (ref 0.61–1.24)
GFR calc non Af Amer: >60 mL/min (ref >60)
Glucose, Bld: 78 mg/dL (ref 65–99)
Potassium: 3.6 mmol/L (ref 3.5–5.1)
Sodium: 132 mmol/L — ABNORMAL LOW (ref 135–145)

## 2017-09-13 LAB — CBC
HCT: 26.5 % — ABNORMAL LOW (ref 40.0–52.0)
Hemoglobin: 9.2 g/dL — ABNORMAL LOW (ref 13.0–18.0)
MCH: 30.2 pg (ref 26.0–34.0)
MCHC: 34.7 g/dL (ref 32.0–36.0)
MCV: 87.1 fL (ref 80.0–100.0)
PLATELETS: 148 10*3/uL — AB (ref 150–440)
RBC: 3.05 MIL/uL — AB (ref 4.40–5.90)
RDW: 13.1 % (ref 11.5–14.5)
WBC: 7.1 10*3/uL (ref 3.8–10.6)

## 2017-09-13 SURGERY — LOWER EXTREMITY ANGIOGRAPHY
Anesthesia: Moderate Sedation | Laterality: Left

## 2017-09-13 SURGERY — LOWER EXTREMITY ANGIOGRAPHY
Anesthesia: General | Laterality: Left

## 2017-09-13 MED ORDER — HEPARIN (PORCINE) IN NACL 2-0.9 UNIT/ML-% IJ SOLN
INTRAMUSCULAR | Status: AC
  Filled 2017-09-13: qty 1000

## 2017-09-13 MED ORDER — PROPOFOL 10 MG/ML IV BOLUS
INTRAVENOUS | Status: DC | PRN
  Administered 2017-09-13: 100 mg via INTRAVENOUS

## 2017-09-13 MED ORDER — ONDANSETRON HCL 4 MG/2ML IJ SOLN: 4.0000 mg | Freq: Once | INTRAMUSCULAR | Status: DC | PRN

## 2017-09-13 MED ORDER — MIDAZOLAM HCL 2 MG/2ML IJ SOLN
INTRAMUSCULAR | Status: AC
  Filled 2017-09-13: qty 2

## 2017-09-13 MED ORDER — FENTANYL CITRATE (PF) 100 MCG/2ML IJ SOLN
INTRAMUSCULAR | Status: AC
  Filled 2017-09-13: qty 2

## 2017-09-13 MED ORDER — FENTANYL CITRATE (PF) 100 MCG/2ML IJ SOLN
25.0000 ug | INTRAMUSCULAR | Status: DC | PRN
  Administered 2017-09-13 (×4): 25 ug via INTRAVENOUS

## 2017-09-13 MED ORDER — LIDOCAINE-EPINEPHRINE (PF) 1 %-1:200000 IJ SOLN
INTRAMUSCULAR | Status: AC
  Filled 2017-09-13: qty 30

## 2017-09-13 MED ORDER — PROPOFOL 10 MG/ML IV BOLUS
INTRAVENOUS | Status: AC
  Filled 2017-09-13: qty 20

## 2017-09-13 MED ORDER — APIXABAN 5 MG PO TABS
5.0000 mg | ORAL_TABLET | Freq: Two times a day (BID) | ORAL | Status: DC
  Administered 2017-09-13 – 2017-09-16 (×7): 5 mg via ORAL
  Filled 2017-09-13: qty 2
  Filled 2017-09-13 (×3): qty 1
  Filled 2017-09-13 (×2): qty 2
  Filled 2017-09-13: qty 1

## 2017-09-13 MED ORDER — HEPARIN SODIUM (PORCINE) 1000 UNIT/ML IJ SOLN
INTRAMUSCULAR | Status: DC | PRN
  Administered 2017-09-13: 4000 [IU] via INTRAVENOUS

## 2017-09-13 MED ORDER — HYDROMORPHONE HCL 1 MG/ML IJ SOLN
0.5000 mg | INTRAMUSCULAR | Status: AC | PRN
  Administered 2017-09-13 (×4): 0.5 mg via INTRAVENOUS

## 2017-09-13 MED ORDER — EPHEDRINE SULFATE 50 MG/ML IJ SOLN
INTRAMUSCULAR | Status: DC | PRN
  Administered 2017-09-13: 10 mg via INTRAVENOUS

## 2017-09-13 MED ORDER — IOPAMIDOL (ISOVUE-300) INJECTION 61%
INTRAVENOUS | Status: DC | PRN
  Administered 2017-09-13: 105 mL via INTRAVENOUS

## 2017-09-13 MED ORDER — FENTANYL CITRATE (PF) 100 MCG/2ML IJ SOLN
INTRAMUSCULAR | Status: DC | PRN
  Administered 2017-09-13 (×4): 25 ug via INTRAVENOUS

## 2017-09-13 SURGICAL SUPPLY — 27 items
BALLN LUTONIX 4X150X130 (BALLOONS) ×3
BALLN LUTONIX DCB 6X40X130 (BALLOONS) ×3
BALLN LUTONIX DCB 6X80X130 (BALLOONS) ×3
BALLN ULTRVRSE 018 2.5X150X150 (BALLOONS) ×3
BALLN ULTRVRSE 5X10XX130C (BALLOONS) ×3
BALLOON LUTONIX 4X150X130 (BALLOONS) ×1 IMPLANT
BALLOON LUTONIX DCB 6X40X130 (BALLOONS) ×1 IMPLANT
BALLOON LUTONIX DCB 6X80X130 (BALLOONS) ×1 IMPLANT
BALLOON ULTRVRSE 5X10XX130C (BALLOONS) ×1 IMPLANT
BALLOON ULTRVS 018 2.5X150X150 (BALLOONS) ×1 IMPLANT
CANISTER PENUMBRA MAX (MISCELLANEOUS) ×3 IMPLANT
CATH BEACON 5 .038 100 VERT TP (CATHETERS) ×3 IMPLANT
CATH CXI SUPP ANG 4FR 135 (MICROCATHETER) ×1 IMPLANT
CATH CXI SUPP ANG 4FR 135CM (MICROCATHETER) ×3
CATH INDIGO 6 ST-TIP 135CM (CATHETERS) ×2 IMPLANT
DEVICE PRESTO INFLATION (MISCELLANEOUS) ×3 IMPLANT
DEVICE SAFEGUARD 24CM (GAUZE/BANDAGES/DRESSINGS) ×3 IMPLANT
DEVICE STARCLOSE SE CLOSURE (Vascular Products) ×3 IMPLANT
GLIDEWIRE ADV .035X260CM (WIRE) ×3 IMPLANT
INTRODUCER 7FR 23CM (SHEATH) ×3 IMPLANT
PACK ANGIOGRAPHY (CUSTOM PROCEDURE TRAY) ×3 IMPLANT
SHEATH DESTINATION 7F 45CM RDC (SHEATH) ×3 IMPLANT
STENT LIFESTREAM 8X58X80 (Permanent Stent) ×3 IMPLANT
STENT VIABAHN 5X100X120 (Permanent Stent) ×3 IMPLANT
TUBING ASPIRATION INDIGO (MISCELLANEOUS) ×2 IMPLANT
WIRE G V18X300CM (WIRE) ×3 IMPLANT
WIRE MAGIC TORQUE 260C (WIRE) ×3 IMPLANT

## 2017-09-13 NOTE — Progress Notes (Signed)
PAD has been taken off. Site has a area directly about the access site that is raised and hard. Dr. Wyn Quakerew paged and notified. Per Dr. Wyn Quakerew, this is chronic for the patient.

## 2017-09-13 NOTE — Anesthesia Preprocedure Evaluation (Signed)
Anesthesia Evaluation  Patient identified by MRN, date of birth, ID band Patient awake    Reviewed: Allergy & Precautions, H&P , NPO status , Patient's Chart, lab work & pertinent test results, reviewed documented beta blocker date and time   History of Anesthesia Complications Negative for: history of anesthetic complications  Airway Mallampati: I  TM Distance: >3 FB Neck ROM: full    Dental  (+) Edentulous Upper, Edentulous Lower   Pulmonary shortness of breath and with exertion, COPD,  COPD inhaler, neg recent URI, former smoker,           Cardiovascular Exercise Tolerance: Good hypertension, (-) angina+ CAD, + Cardiac Stents and + Peripheral Vascular Disease  (-) Past MI and (-) CABG + dysrhythmias Atrial Fibrillation (-) Valvular Problems/Murmurs     Neuro/Psych Seizures -, Well Controlled,  negative psych ROS   GI/Hepatic negative GI ROS, (+) Cirrhosis       ,   Endo/Other  diabetes, Well Controlled  Renal/GU negative Renal ROS  negative genitourinary   Musculoskeletal   Abdominal   Peds  Hematology negative hematology ROS (+)   Anesthesia Other Findings Past Medical History: No date: Atrial fibrillation (HCC) No date: CAD (coronary artery disease) No date: COPD (chronic obstructive pulmonary disease) (HCC) No date: DM (diabetes mellitus) (HCC)     Comment:  diet controlled No date: Liver cirrhosis (HCC) No date: Liver cirrhosis (HCC) No date: Seizure disorder (HCC)   Reproductive/Obstetrics negative OB ROS                             Anesthesia Physical Anesthesia Plan  ASA: III  Anesthesia Plan: General   Post-op Pain Management:    Induction: Intravenous  PONV Risk Score and Plan: 2 and Ondansetron and Dexamethasone  Airway Management Planned: LMA  Additional Equipment:   Intra-op Plan:   Post-operative Plan: Extubation in OR  Informed Consent: I have  reviewed the patients History and Physical, chart, labs and discussed the procedure including the risks, benefits and alternatives for the proposed anesthesia with the patient or authorized representative who has indicated his/her understanding and acceptance.   Dental Advisory Given  Plan Discussed with: Anesthesiologist, CRNA and Surgeon  Anesthesia Plan Comments:         Anesthesia Quick Evaluation

## 2017-09-13 NOTE — Anesthesia Procedure Notes (Signed)
Procedure Name: LMA Insertion Date/Time: 09/13/2017 7:45 AM Performed by: Eduardo OsierKilduff, Azula Zappia, CRNA Pre-anesthesia Checklist: Timeout performed, Patient being monitored, Suction available, Emergency Drugs available and Patient identified Patient Re-evaluated:Patient Re-evaluated prior to induction Oxygen Delivery Method: Circle system utilized Preoxygenation: Pre-oxygenation with 100% oxygen Induction Type: IV induction LMA: LMA inserted LMA Size: 4.0 Number of attempts: 1 Placement Confirmation: breath sounds checked- equal and bilateral,  CO2 detector and positive ETCO2 Tube secured with: Tape

## 2017-09-13 NOTE — Progress Notes (Signed)
Pt for surgery this morning. Refused CHG bath.  Pt educated on why CHG given prior to surgery.  Stated he would rather wait until he got to surger.

## 2017-09-13 NOTE — Progress Notes (Signed)
[facility]   PATIENT NAME: Stanley CairoBruce Anstey    MR#:  [id number]  DATE OF BIRTH:  07-24-1946  SUBJECTIVE:    status post angioplasty and stent placement, mechanical thrombectomy by vascular today.  REVIEW OF SYSTEMS:    Review of Systems  Constitutional: Negative for chills and fever.  HENT: Negative for congestion and tinnitus.   Eyes: Negative for blurred vision and double vision.  Respiratory: Negative for cough, shortness of breath and wheezing.   Cardiovascular: Negative for chest pain, orthopnea and PND.  Gastrointestinal: Negative for abdominal pain, diarrhea, nausea and vomiting.  Genitourinary: Negative for dysuria and hematuria.  Musculoskeletal: Positive for joint pain (left foot/great Toe. ).  Neurological: Negative for dizziness, sensory change and focal weakness.  All other systems reviewed and are negative.   Nutrition: Heart healthy Tolerating Diet: Yes Tolerating PT: Await Eval.    DRUG ALLERGIES:   Allergies  Allergen Reactions  . Keppra [Levetiracetam] Rash    VITALS:  Blood pressure (!) 155/57, pulse 64, temperature 97.6 F (36.4 C), resp. rate 11, height 5\' 10"  (1.778 m), weight 68 kg (149 lb 14.6 oz), SpO2 93 %.  PHYSICAL EXAMINATION:   Physical Exam  GENERAL:  71 y.o.-year-old patient lying in bed in mild distress with left foot pain.   EYES: Pupils equal, round, reactive to light and accommodation. No scleral icterus. Extraocular muscles intact.  HEENT: Head atraumatic, normocephalic. Oropharynx and nasopharynx clear.  NECK:  Supple, no jugular venous distention. No thyroid enlargement, no tenderness.  LUNGS: Normal breath sounds bilaterally, no wheezing, rales, rhonchi. No use of accessory muscles of respiration.  CARDIOVASCULAR: S1, S2 normal. No murmurs, rubs, or gallops.  ABDOMEN: Soft, nontender, nondistended. Bowel sounds present. No organomegaly or mass.  EXTREMITIES: Right foot with a wound VAC in  place, left foot with a great toe that is necrotic in nature with cyanosis noted. Pulses are not palpable bilaterally, no edema.  NEUROLOGIC: Cranial nerves II through XII are intact. No focal Motor or sensory deficits b/l.   PSYCHIATRIC: The patient is alert and oriented x 3.  SKIN: No obvious rash, lesion, or ulcer.    LABORATORY PANEL:   CBC Recent Labs  Lab 09/13/17 0405  WBC 7.1  HGB 9.2*  HCT 26.5*  PLT 148*   ------------------------------------------------------------------------------------------------------------------  Chemistries  Recent Labs  Lab 09/09/17 1206  09/13/17 0405  NA 125*   < > 132*  K 4.1   < > 3.6  CL 90*   < > 99*  CO2 24   < > 25  GLUCOSE 100*   < > 78  BUN 8   < > 7  CREATININE 0.75   < > 0.61  CALCIUM 8.2*   < > 7.4*  AST 49*  --   --   ALT 23  --   --   ALKPHOS 106  --   --   BILITOT 0.8  --   --    < > = values in this interval not displayed.   ------------------------------------------------------------------------------------------------------------------  Cardiac Enzymes No results for input(s): TROPONINI in the last 168 hours. ------------------------------------------------------------------------------------------------------------------  RADIOLOGY:  No results found.   ASSESSMENT AND PLAN:   71 year old male with past medical history of seizure disorder, liver cirrhosis, diabetes, COPD, history of coronary artery disease, atrial fibrillation and presents to the hospital due to left foot pain and swelling.  1. Left foot/great toe gangrene - appreciate vascular surgery input, status post left leg  angiogram y  Left iliac stents and also thrombus extended to the left common iliac artery.  Status post angioplasty of left common femoral, left peroneal, left popliteal and left posterior tibial, stent placement and left common iliac.  Had diffuse disease in the left leg,, had multiple procedures today to help with circulation.   Appreciate podiatry also following the patient. -t. - appreciate ID, Podiatry and vascular surgery input.   2. Essential hypertension-continue Norvasc, lisinopril.  3. History of atrial fibrillation-rate controlled. Continue sotalol.  Continue Eliquis 5 mg p.o. twice daily  4. COPD-no acute exacerbation-continue Spiriva  6. History of seizures-continue Lacosamide.   7. Neuropathy-continue gabapentin.  8. Anxiety-continue BuSpar.  #9 .recent  Right leg osteomyelitis: Continue Unasyn, wound VAC as per ID instructions.  All the records are reviewed and case discussed with Care Management/Social Worker. Management plans discussed with the patient, family and they are in agreement.  CODE STATUS: Full code  DVT Prophylaxis: Eliquis  TOTAL TIME TAKING CARE OF THIS PATIENT: 25 minutes.   POSSIBLE D/C IN 2-3 DAYS, DEPENDING ON CLINICAL CONDITION.   Katha HammingSnehalatha Arrie Borrelli M.D on 09/13/2017 at 11:58 AM  Between 7am to 6pm - Pager - [phone]  After 6pm go to [email or web address] - Social research officer, governmentpassword [email or web address] [email or web address]  [facility]  Office  [phone]  CC: Primary care physician; Sherron Mondayejan-Sie, S Ahmed, MD

## 2017-09-13 NOTE — Progress Notes (Signed)
[facility] INFECTIOUS DISEASE PROGRESS NOTE Date of Admission:  09/09/2017     ID: Stanley Nguyen is a 71 y.o. male with L foot and toe gangrene, R foot osteomyelitis Active Problems:   Gangrene of toe of left foot (HCC)   Subjective: Out of unit, s/p extensive LLE revascularization.   ROS  Eleven systems are reviewed and negative except per hpi  Medications:  Antibiotics Given (last 72 hours)    Date/Time Action Medication Dose Rate   09/10/17 2111 New Bag/Given   Ampicillin-Sulbactam (UNASYN) 3 g in sodium chloride 0.9 % 100 mL IVPB 3 g 200 mL/hr   09/11/17 0316 New Bag/Given   Ampicillin-Sulbactam (UNASYN) 3 g in sodium chloride 0.9 % 100 mL IVPB 3 g 200 mL/hr   09/11/17 1055 New Bag/Given  [forpre- op]   cefUROXime (ZINACEF) 1.5 g in dextrose 5 % 50 mL IVPB 1.5 g 100 mL/hr   09/11/17 1209 New Bag/Given   Ampicillin-Sulbactam (UNASYN) 3 g in sodium chloride 0.9 % 100 mL IVPB 3 g 200 mL/hr   09/11/17 2134 New Bag/Given   Ampicillin-Sulbactam (UNASYN) 3 g in sodium chloride 0.9 % 100 mL IVPB 3 g 200 mL/hr   09/12/17 0400 New Bag/Given   Ampicillin-Sulbactam (UNASYN) 3 g in sodium chloride 0.9 % 100 mL IVPB 3 g 200 mL/hr   09/12/17 0900 New Bag/Given   Ampicillin-Sulbactam (UNASYN) 3 g in sodium chloride 0.9 % 100 mL IVPB 3 g 200 mL/hr   09/12/17 1545 New Bag/Given   Ampicillin-Sulbactam (UNASYN) 3 g in sodium chloride 0.9 % 100 mL IVPB 3 g 200 mL/hr   09/12/17 2134 New Bag/Given   Ampicillin-Sulbactam (UNASYN) 3 g in sodium chloride 0.9 % 100 mL IVPB 3 g 200 mL/hr   09/13/17 0329 New Bag/Given   Ampicillin-Sulbactam (UNASYN) 3 g in sodium chloride 0.9 % 100 mL IVPB 3 g 200 mL/hr   09/13/17 1504 New Bag/Given   Ampicillin-Sulbactam (UNASYN) 3 g in sodium chloride 0.9 % 100 mL IVPB 3 g 200 mL/hr     . amLODipine  10 mg Oral Daily  . apixaban  5 mg Oral BID  . aspirin EC  81 mg Oral Daily  . busPIRone  7.5 mg Oral BID  . celecoxib  100 mg Oral Daily  . docusate sodium   100 mg Oral BID  . gabapentin  300 mg Oral TID  . lacosamide  50 mg Oral BID  . lisinopril  10 mg Oral Daily  . nicotine  21 mg Transdermal Daily  . oxyCODONE  30 mg Oral Q8H  . polyethylene glycol  17 g Oral Daily  . potassium chloride  20 mEq Oral Daily  . sodium chloride flush  3 mL Intravenous Q12H  . sotalol  80 mg Oral Daily  . tiotropium  1 capsule Inhalation Daily    Objective: Vital signs in last 24 hours: Temp:  [96.8 F (36 C)-98.6 F (37 C)] 97.6 F (36.4 C) (11/16 1500) Pulse Rate:  [61-132] 70 (11/16 1600) Resp:  [8-18] 14 (11/16 1600) BP: (128-168)/(55-107) 145/56 (11/16 1600) SpO2:  [90 %-100 %] 93 % (11/16 1600) Constitutional: He is oriented to person, place, and time.thin, frail  HENT: anicteric  Mouth/Throat: Oropharynx is clear and moist. No oropharyngeal exudate.  Cardiovascular: Normal rate, regular rhythm and normal heart sounds. Pulmonary/Chest: Effort normal and breath sounds normal. No respiratory distress. He has no wheezes.  Abdominal: Soft. Bowel sounds are normal. He exhibits no distension. There is no  tenderness.  Lymphadenopathy: He has no cervical adenopathy.  Neurological: He is alert and oriented to person, place, and time.  Skin: Derm:The dorsal aspect of his right foot has wound vac L foot with gangrene of great toe Psychiatric: He has a normal mood and affect. His behavior is normal.   Lab Results Recent Labs    09/11/17 0012  09/12/17 0945 09/13/17 0405  WBC  --    < > 8.8 7.1  HGB  --    < > 11.1* 9.2*  HCT  --    < > 33.1* 26.5*  NA 131*  --   --  132*  K 3.2*  --   --  3.6  CL 100*  --   --  99*  CO2 23  --   --  25  BUN 11  --   --  7  CREATININE 0.85  --   --  0.61   < > = values in this interval not displayed.    Microbiology: Results for orders placed or performed during the hospital encounter of 07/10/17  Wound or Superficial Culture     Status: None   Collection Time: 07/10/17  4:30 PM  Result Value Ref Range  Status   Specimen Description FOOT  Final   Special Requests NONE  Final   Gram Stain   Final    RARE WBC SEEN FEW GRAM POSITIVE COCCI Performed at [facility], [address] [place], [place] [id number]    Culture MODERATE STAPHYLOCOCCUS AUREUS  Final   Report Status 07/13/2017 FINAL  Final   Organism ID, Bacteria STAPHYLOCOCCUS AUREUS  Final      Susceptibility   Staphylococcus aureus - MIC*    CIPROFLOXACIN 1 SENSITIVE Sensitive     ERYTHROMYCIN <=0.25 SENSITIVE Sensitive     GENTAMICIN <=0.5 SENSITIVE Sensitive     OXACILLIN 0.5 SENSITIVE Sensitive     TETRACYCLINE <=1 SENSITIVE Sensitive     VANCOMYCIN <=0.5 SENSITIVE Sensitive     TRIMETH/SULFA <=10 SENSITIVE Sensitive     CLINDAMYCIN <=0.25 SENSITIVE Sensitive     RIFAMPIN <=0.5 SENSITIVE Sensitive     Inducible Clindamycin NEGATIVE Sensitive     * MODERATE STAPHYLOCOCCUS AUREUS  Culture, blood (routine x 2)     Status: None   Collection Time: 07/10/17  4:30 PM  Result Value Ref Range Status   Specimen Description BLOOD BLOOD LEFT FOREARM  Final   Special Requests   Final    BOTTLES DRAWN AEROBIC AND ANAEROBIC Blood Culture results may not be optimal due to an excessive volume of blood received in culture bottles   Culture NO GROWTH 5 DAYS  Final   Report Status 07/15/2017 FINAL  Final  Culture, blood (routine x 2)     Status: None   Collection Time: 07/10/17  4:30 PM  Result Value Ref Range Status   Specimen Description BLOOD LEFT ANTECUBITAL  Final   Special Requests   Final    BOTTLES DRAWN AEROBIC AND ANAEROBIC Blood Culture results may not be optimal due to an excessive volume of blood received in culture bottles   Culture NO GROWTH 5 DAYS  Final   Report Status 07/15/2017 FINAL  Final  Surgical pcr screen     Status: Abnormal   Collection Time: 07/10/17  9:05 PM  Result Value Ref Range Status   MRSA, PCR NEGATIVE NEGATIVE Final   Staphylococcus aureus POSITIVE (A) NEGATIVE Final    Comment: (NOTE) The  Xpert  SA Assay (FDA approved for NASAL specimens in patients 51 years of age and older), is one component of a comprehensive surveillance program. It is not intended to diagnose infection nor to guide or monitor treatment.   Aerobic/Anaerobic Culture (surgical/deep wound)     Status: None   Collection Time: 07/12/17  9:57 AM  Result Value Ref Range Status   Specimen Description FOOT RIGHT first metatarsal SWAB  Final   Special Requests NONE  Final   Gram Stain   Final    FEW WBC PRESENT, PREDOMINANTLY PMN FEW GRAM POSITIVE COCCI IN CLUSTERS    Culture   Final    FEW STAPHYLOCOCCUS AUREUS FEW STREPTOCOCCUS ANGINOSIS SEE STAPH AUREUS SUSCEPTIBILITIES IN RESULTS REVIEW / OTHER TESTS/ MISC LAB TEST SECTION FOR 08/05/17 Performed at [facility], [address] [place], [place] [address]    Report Status 08/05/2017 FINAL  Final   Organism ID, Bacteria STREPTOCOCCUS ANGINOSIS  Final      Susceptibility   Streptococcus anginosis - MIC*    PENICILLIN <=0.06 SENSITIVE Sensitive     CEFTRIAXONE 0.25 SENSITIVE Sensitive     ERYTHROMYCIN <=0.12 SENSITIVE Sensitive     LEVOFLOXACIN 0.5 SENSITIVE Sensitive     VANCOMYCIN 1 SENSITIVE Sensitive     * FEW STREPTOCOCCUS ANGINOSIS  Susceptibility, Aer + Anaerob     Status: None   Collection Time: 07/12/17  9:57 AM  Result Value Ref Range Status   Suscept, Aer + Anaerob Final report  Corrected    Comment: (NOTE) Performed At: [facility] [address] [place], [place] [id number] Mila Homer MD QI:[id number] CORRECTED ON 09/20 AT 1731: PREVIOUSLY REPORTED AS Preliminary report    Source of Sample WOUND  Final    Comment: STAPHYLOCOCCUS AUREUS Performed at [facility], [address], [place], [place] [address]   Susceptibility Result     Status: None   Collection Time: 07/12/17  9:57 AM  Result Value Ref Range Status   Suscept Result 1 Staphylococcus aureus  Final    Comment: (NOTE) Identification performed by  account, not confirmed by this laboratory. Based on resistance to penicillin and susceptibility to oxacillin this isolate would be susceptible to: * Penicillinase-stable penicillins; such as:    Cloxacillin    Dicloxacillin    Nafcillin * Beta-lactam/beta-lactamase inhibitor combinations; such as:    Amoxicillin-clavulanic acid    Ampicillin-sulbactam * Antistaphylococcal cephems; such as:    Cefaclor    Cefuroxime * Antistaphylococcal carbapenems; such as:    Imipenem    Meropenem Clindamycin      S Erythromycin     S    Antimicrobial Suscept Comment  Final    Comment: (NOTE)      ** S = Susceptible; I = Intermediate; R = Resistant **                   P = Positive; N = Negative            MICS are expressed in micrograms per mL   Antibiotic                 RSLT#1    RSLT#2    RSLT#3    RSLT#4 Ciprofloxacin                  S Clindamycin                    S Erythromycin  S Gentamicin                     S Levofloxacin                   S Linezolid                      S Moxifloxacin                   S Nitrofurantoin                 S Oxacillin                      S Penicillin                     R Quinupristin/Dalfopristin      S Rifampin                       S Tetracycline                   S Trimethoprim/Sulfa             S Vancomycin                     S Performed At: [facility] [address] [place], [place] [id number] Mila Homer MD WU:[id number] Performed at [facility], [address] [place], [place], [place] [address]     Studies/Results: No results found.  Assessment/Plan: TARENCE SEARCY is a 71 y.o. male with L foot gangrene mainly of great toe while also being treated for R foot osteomyelitis.  Prior cx from R foot with MSSA and Strep anginosis and he had been treated with IV abx and recently changed to augmentin (was going to use keflex in place of augmentin if he was improving). His R foot seems to be improving but  L foot developed gangrene and has had extensive revascularization. Plan if for later amputation after demarcation.   Recommendations Continue unasyn while inpatient but can change to augmentin at dc Would plan on continuing abx until after toe amputation and for a 10 day period following that.  Thank you very much for the consult. Will follow with you.  Mick Sell   09/13/2017, 5:08 PM

## 2017-09-13 NOTE — Op Note (Addendum)
[facility] Percutaneous Study/Intervention Procedural Note   Date of Surgery:  09/13/2017  Surgeon(s):DEW,JASON   Assistants:none  Pre-operative Diagnosis: PAD with gangrene left lower extremity, status post continuous infusion of thrombolytic therapy  Post-operative diagnosis: Same  Procedure(s) Performed: 1. Angiogram through existing catheter left lower extremity 2. Catheter placement into left peroneal artery and left posterior tibial artery from right femoral approach 3. Covered stent placement to the left common and external iliac arteries with 8 mm diameter by 58 mm length lifestream stent 4. Percutaneous transluminal angioplasty of left common femoral artery with 6 mm diameter by 8 cm length Lutonix drug-coated angioplasty balloon 5. Percutaneous mechanical thrombectomy with the penumbra cat 6 device to the left SFA, popliteal artery, and tibioperoneal trunk and proximal peroneal arteries  6.  Percutaneous transluminal angioplasty of the left peroneal artery and tibioperoneal trunk with 2.5 mm diameter by 15 cm length angioplasty balloon inflated twice  7.  Percutaneous transluminal angioplasty of the left popliteal artery with 4 mm diameter by 15 cm length Lutonix drug-coated angioplasty balloon  8.  Percutaneous transluminal angioplasty of the left posterior tibial artery with 2.5 mm diameter by 15 cm length angioplasty balloon inflated twice  9.  Viabahn stent placement to the left popliteal artery for residual thrombus and stenosis after angioplasty with 5 mm diameter by 10 cm length covered stent  10.  Percutaneous transluminal angioplasty of the right proximal common femoral artery and distal external iliac artery with 6 mm diameter by 4 cm length Lutonix drug-coated angioplasty balloon 11. StarClose closure device right femoral artery  EBL: 250 cc  Contrast: 105  cc  Fluoro Time: 12 minutes  Anesthesia: general  Indications: Patient is a 71 y.o.male with critical limb ischemia with gangrenous toes and rest pain.  He has been running a continuous TPA infusion for thrombolytic therapy.  The patient is brought in for angiography for further evaluation and potential treatment. Risks and benefits are discussed and informed consent is obtained  Procedure: The patient was identified and appropriate procedural time out was performed. The patient was then placed supine on the table and prepped and draped in the usual sterile fashion. Anesthesia provided general anesthetic.  The existing thrombolytic catheter was removed and a Magic torque wire was placed.  Selective left lower extremity angiogram was then performed. This demonstrated the left iliac system now had in-line flow but there were several significant blobs of thrombus in the distal common iliac artery and proximal external iliac artery.  This was between 2 previously placed stents.  The left common femoral artery had about a 70-80% stenosis.  The SFA then normalized until Hunter's canal where there is a small amount of residual thrombus remaining.  The popliteal artery was occluded with thrombus and likely native disease with reconstitution from distal posterior tibial artery at the ankle and the peroneal artery in the mid segment.  The anterior tibial artery was not seen. The patient was systemically heparinized and I elected to treat the iliac lesion with a covered stent.  I upsized to a 7 [place] Terumo sheath over the Magic torque wire.  An 8 mm diameter by 58 mm length lifestream covered stent was then deployed in the common and external iliac arteries bridging the 2 previously placed stents and covering the thrombus.  No significant residual stenosis in the iliac system was seen after this.  A 7 French 45 cm sheath was then placed over the [employer] wire. I then treated the left  common  femoral stenosis with a 6 mm diameter by 8 cm length angioplasty balloon taken up into the distal external iliac artery.  This was inflated to 10 atm for 1 minute.  Completion angiogram showed only about a 10% residual stenosis.  I then turned my attention to the more distal disease.  The penumbra cat 6 device was then used to treat the superficial femoral artery, popliteal artery, tibioperoneal trunk, and proximal peroneal artery.  Several passes were made.  Using a Kumpe catheter and advantage wire was able to cross the occlusion and confirm intraluminal flow in the peroneal artery and then exchanged for a 0.018 wire.  After running the penumbra cat 6 catheter several times, the thrombus burden was improved but near occlusive residual stenosis was still seen in popliteal artery and the proximal tibial vessels remained occluded.  The distal SFA thrombus was markedly improved and essentially no stenosis was residual there.  Angioplasty was then performed first and the peroneal artery and tibioperoneal trunk with 2 inflations with a 2.5 mm diameter by 15 cm length angioplasty balloon up to about 10 atm for 1 minute.  I then treated the popliteal artery with a 4 mm diameter by 15 cm length Lutonix drug-coated angioplasty balloon.  This was taken to 12 atm for 1 minute.  There remained about an 80% residual stenosis with thrombus in the popliteal artery, but the tibioperoneal trunk and peroneal artery were now widely patent with no significant residual stenosis and there was now a good stump of the proximal posterior tibial artery that could be seen.  I then used a CXI catheter and the advantage wire to gain access to the posterior tibial artery and exchanged for a 0.018 wire and the CXI catheter and cross the occlusion without difficulty confirming intraluminal flow in the distal posterior tibial artery.  The 018 wire was then replaced.  Again, 2 inflations with a 2.5 mm diameter by 15 cm length angioplasty balloon in  the posterior tibial artery and up to the tibioperoneal trunk were performed.  The first inflation was to 8 atm and the second inflation was to 12 atm.  I elected to cover the popliteal lesion with a covered stent.  A 5 mm diameter by 10 cm length Viabahn covered stent was then deployed in the left popliteal artery and postdilated with a 5 mm balloon.  At this point there was less than 10% residual stenosis in the popliteal artery in both the posterior tibial and peroneal arteries had no greater than 10-20% residual stenosis with in-line flow distally.  I then pulled the sheath back to the ipsilateral external iliac artery and performed angiogram with intention of evaluating for a closure device.  At the bottom of the previously placed right external iliac artery stent there was a 70-75% stenosis that was highly irregular and possibly the site of a clamp stenosis at the top of his previous femoral endarterectomy.  I elected to treat this area to avoid thrombosis of his previous femoral endarterectomy as well as to allow a safe place in the more distal common femoral artery at the site of our access in the core matrix patch.  A 6 mm diameter by 4 cm length Lutonix drug-coated angioplasty balloon was used to treat the proximal right common femoral artery and distal right external iliac artery.  This was inflated to 10 atm for 1 minute and a completion angiogram showed only about a 15% residual stenosis.  Using fluoroscopic guidance, the sheath was  removed and StarClose closure device was deployed in the right femoral artery with excellent hemostatic result. The patient was taken to the recovery room in stable condition having tolerated the procedure well.  Findings:  Left Lower Extremity:The left iliac system now had in-line flow but there were several significant blobs of thrombus in the distal common iliac artery and proximal external iliac artery.  This was between 2 previously placed stents.  The  left common femoral artery had about a 70-80% stenosis.  The SFA then normalized until Hunter's canal where there is a small amount of residual thrombus remaining.  The popliteal artery was occluded with thrombus and likely native disease with reconstitution from distal posterior tibial artery at the ankle and the peroneal artery in the mid segment.  The anterior tibial artery was not seen.   Disposition: Patient was taken to the recovery room in stable condition having tolerated the procedure well.  Complications: None  Leotis Pain 09/13/2017 9:20 AM   This note was created with Dragon Medical transcription system. Any errors in dictation are purely unintentional.

## 2017-09-13 NOTE — Progress Notes (Signed)
Pt accepted in transfer from [facility]. repport received from jean in [facility]. Pt is a/o. Comfortable. Rt foot wound vac functioning well. Pt oriented to room. Call light in reach as well as the phone.

## 2017-09-13 NOTE — Anesthesia Post-op Follow-up Note (Signed)
Anesthesia QCDR form completed.        

## 2017-09-13 NOTE — Transfer of Care (Signed)
Immediate Anesthesia Transfer of Care Note  Patient: Stanley CornwallBruce J Nguyen  Procedure(s) Performed: Lower Extremity Angiography (Left )  Patient Location: PACU  Anesthesia Type:General  Level of Consciousness: awake and oriented  Airway & Oxygen Therapy: Patient Spontanous Breathing and Patient connected to face mask oxygen  Post-op Assessment: Report given to RN  Post vital signs: Reviewed and stable  Last Vitals:  Vitals:   09/13/17 0704 09/13/17 0918  BP: (!) 168/58 (!) 149/62  Pulse: 73   Resp: 18 11  Temp: 37 C (!) 36 C  SpO2: 97%     Last Pain:  Vitals:   09/13/17 0704  TempSrc:   PainSc: 10-Worst pain ever      Patients Stated Pain Goal: 3 (09/12/17 1945)  Complications: No apparent anesthesia complications

## 2017-09-14 LAB — GLUCOSE, CAPILLARY: GLUCOSE-CAPILLARY: 108 mg/dL — AB (ref 65–99)

## 2017-09-14 MED ORDER — HYDROMORPHONE HCL 1 MG/ML IJ SOLN
1.0000 mg | INTRAMUSCULAR | Status: DC | PRN
  Administered 2017-09-14 – 2017-09-16 (×8): 1 mg via INTRAVENOUS
  Filled 2017-09-14 (×7): qty 1

## 2017-09-14 MED ORDER — HYDROMORPHONE HCL 2 MG PO TABS
2.0000 mg | ORAL_TABLET | Freq: Four times a day (QID) | ORAL | Status: DC
  Administered 2017-09-14 – 2017-09-17 (×12): 2 mg via ORAL
  Filled 2017-09-14 (×12): qty 1

## 2017-09-14 MED ORDER — OXYCODONE HCL ER 20 MG PO T12A
40.0000 mg | EXTENDED_RELEASE_TABLET | Freq: Three times a day (TID) | ORAL | Status: DC
  Administered 2017-09-14 – 2017-09-17 (×9): 40 mg via ORAL
  Filled 2017-09-14 (×9): qty 2

## 2017-09-14 NOTE — Progress Notes (Signed)
PT Cancellation Note  Patient Details Name: Stanley Nguyen MRN: [id number] DOB: [birth year]   Cancelled Treatment:    Reason Eval/Treat Not Completed: Other (comment).  Pt refused and noted the vac on R foot, L great toe dark and clear signs of gangrene.  Pt is stating he is in too much pain and upset to move with lines.  Will reattempt in the AM as he is likely tired and in need of more meds to move.  Talked with pt about the purpose of PT eval and may agree in the AM.   Stanley Nguyen 09/14/2017, 2:14 PM   Stanley Nguyen, PT MS Acute Rehab Dept. Number: [facility] [phone] and [facility] [phone]

## 2017-09-14 NOTE — Plan of Care (Signed)
  Not Progressing Pain Managment: General experience of comfort will improve 09/14/2017 0010 - Not Progressing by Stefan Churchogers, Carrin Vannostrand M, RN

## 2017-09-14 NOTE — Progress Notes (Signed)
Daily Progress Note   Subjective  - 1 Day Post-Op  Pt with gangrene left great toe.  S/p left lower leg revascularization.  With Osteomyelitis on right foot and wound vac  Objective Vitals:   09/13/17 1700 09/13/17 2035 09/14/17 0605 09/14/17 0757  BP: (!) 151/73 (!) 139/51 (!) 129/48 (!) 141/49  Pulse: 71 67 72 76  Resp: (!) 28 18 18 18   Temp:  98.6 F (37 C) 99 F (37.2 C) 98.8 F (37.1 C)  TempSrc:  Oral Oral Oral  SpO2: 94%  96% 96%  Weight:   70.9 kg (156 lb 4.8 oz)   Height:        Physical Exam: Left great toe and partial 2nd toe with gangrenous changes.  Dusky appearance at MTPJ level.  No purulence or active infection.  Right foot wound vac intact.  Laboratory CBC    Component Value Date/Time   WBC 7.1 09/13/2017 0405   HGB 9.2 (L) 09/13/2017 0405   HGB 8.1 (L) 08/04/2013 0333   HCT 26.5 (L) 09/13/2017 0405   HCT 24.9 (L) 08/03/2013 0456   PLT 148 (L) 09/13/2017 0405   PLT 142 (L) 08/03/2013 0456    BMET    Component Value Date/Time   NA 132 (L) 09/13/2017 0405   NA 132 (L) 08/04/2013 0333   K 3.6 09/13/2017 0405   K 4.4 08/04/2013 0333   CL 99 (L) 09/13/2017 0405   CL 100 08/04/2013 0333   CO2 25 09/13/2017 0405   CO2 27 08/04/2013 0333   GLUCOSE 78 09/13/2017 0405   GLUCOSE 69 08/04/2013 0333   BUN 7 09/13/2017 0405   BUN 3 (L) 08/04/2013 0333   CREATININE 0.61 09/13/2017 0405   CREATININE 0.87 08/04/2013 0333   CALCIUM 7.4 (L) 09/13/2017 0405   CALCIUM 7.6 (L) 08/04/2013 0333   GFRNONAA >60 09/13/2017 0405   GFRNONAA >60 08/04/2013 0333   GFRAA >60 09/13/2017 0405   GFRAA >60 08/04/2013 0333    Assessment/Planning: gangren left forefoot Osteomyelitis right foot   Recommend f/u in outpt clinic.  Will allow demarcation to occur and plan for amputation in future on left.  OK for wbat on right.  C/W wound vac and IV abx for right foot.  F/u in 1 week with me.    Gwyneth RevelsFowler, Aeon Koors A  09/14/2017, 10:53 AM

## 2017-09-14 NOTE — Progress Notes (Signed)
1 Day Post-Op   Subjective/Chief Complaint: Doing OK. Notes pain in great toe. Otherwise without complaint.   Objective: Vital signs in last 24 hours: Temp:  [97.6 F (36.4 C)-99 F (37.2 C)] 98.8 F (37.1 C) (11/17 0757) Pulse Rate:  [62-132] 76 (11/17 0757) Resp:  [9-28] 18 (11/17 0757) BP: (129-163)/(48-107) 141/49 (11/17 0757) SpO2:  [93 %-98 %] 96 % (11/17 0757) Weight:  [70.9 kg (156 lb 4.8 oz)] 70.9 kg (156 lb 4.8 oz) (11/17 0605) Last BM Date: 09/09/17  Intake/Output from previous day: 11/16 0701 - 11/17 0700 In: 1345.8 [I.V.:1245.8; IV Piggyback:100] Out: 2010 [Urine:1960; Blood:50] Intake/Output this shift: No intake/output data recorded.  General appearance: alert and no distress Cardio: regular rate and rhythm, S1, S2 normal, no murmur, click, rub or gallop Extremities: Left: warm, gangrene great toe, erythema- stable over dorsum of foot. Right: Vac in place with good seal. Pulses: +palpable PT  Lab Results:  Recent Labs    09/12/17 0945 09/13/17 0405  WBC 8.8 7.1  HGB 11.1* 9.2*  HCT 33.1* 26.5*  PLT 186 148*   BMET Recent Labs    09/13/17 0405  NA 132*  K 3.6  CL 99*  CO2 25  GLUCOSE 78  BUN 7  CREATININE 0.61  CALCIUM 7.4*   PT/INR No results for input(s): LABPROT, INR in the last 72 hours. ABG No results for input(s): PHART, HCO3 in the last 72 hours.  Invalid input(s): PCO2, PO2  Studies/Results: No results found.  Anti-infectives: Anti-infectives (From admission, onward)   Start     Dose/Rate Route Frequency Ordered Stop   09/10/17 1628  cefUROXime (ZINACEF) 1.5 g in dextrose 5 % 50 mL IVPB     1.5 g 100 mL/hr over 30 Minutes Intravenous 30 min pre-op 09/10/17 1628 09/11/17 1132   09/10/17 1500  Ampicillin-Sulbactam (UNASYN) 3 g in sodium chloride 0.9 % 100 mL IVPB     3 g 200 mL/hr over 30 Minutes Intravenous Every 6 hours 09/10/17 1405     09/09/17 1630  piperacillin-tazobactam (ZOSYN) IVPB 3.375 g     3.375 g 100 mL/hr  over 30 Minutes Intravenous  Once 09/09/17 1629 09/09/17 1849   09/09/17 1630  vancomycin (VANCOCIN) IVPB 1000 mg/200 mL premix     1,000 mg 200 mL/hr over 60 Minutes Intravenous  Once 09/09/17 1629 09/09/17 1919      Assessment/Plan: s/p Procedure(s): Lower Extremity Angiography (Left) S/P stenting SFA/Pop  Continue Eliquis and ASA Podiatry for Great toe amputation OK from Vascular to discharge when ready.  LOS: 5 days    Eli Hosesco, Brinn Westby A 09/14/2017

## 2017-09-14 NOTE — Progress Notes (Signed)
[facility] [place] [facility]   PATIENT NAME: Stanley Nguyen    MR#:  [id number]  DATE OF BIRTH:  11/07/1945  SUBJECTIVE: Patient is still having a lot left foot pain secondary to ischemia.    status post angioplasty and stent placement, mechanical thrombectomy by vascular today.  REVIEW OF SYSTEMS:    Review of Systems  Constitutional: Negative for chills and fever.  HENT: Negative for congestion and tinnitus.   Eyes: Negative for blurred vision and double vision.  Respiratory: Negative for cough, shortness of breath and wheezing.   Cardiovascular: Negative for chest pain, orthopnea and PND.  Gastrointestinal: Negative for abdominal pain, diarrhea, nausea and vomiting.  Genitourinary: Negative for dysuria and hematuria.  Musculoskeletal: Positive for joint pain (left foot/great Toe. ).  Neurological: Negative for dizziness, sensory change and focal weakness.  All other systems reviewed and are negative.   Nutrition: Heart healthy Tolerating Diet: Yes Tolerating PT: Await Eval.    DRUG ALLERGIES:   Allergies  Allergen Reactions  . Keppra [Levetiracetam] Rash    VITALS:  Blood pressure (!) 141/49, pulse 76, temperature 98.8 F (37.1 C), temperature source Oral, resp. rate 18, height 5\' 10"  (1.778 m), weight 70.9 kg (156 lb 4.8 oz), SpO2 96 %.  PHYSICAL EXAMINATION:   Physical Exam  GENERAL:  [age] y.o.-year-old patient lying in bed in mild distress with left foot pain.   EYES: Pupils equal, round, reactive to light and accommodation. No scleral icterus. Extraocular muscles intact.  HEENT: Head atraumatic, normocephalic. Oropharynx and nasopharynx clear.  NECK:  Supple, no jugular venous distention. No thyroid enlargement, no tenderness.  LUNGS: Normal breath sounds bilaterally, no wheezing, rales, rhonchi. No use of accessory muscles of respiration.  CARDIOVASCULAR: S1, S2 normal. No murmurs, rubs, or gallops.  ABDOMEN: Soft, nontender, nondistended.  Bowel sounds present. No organomegaly or mass.  EXTREMITIES: Right foot with a wound VAC in place, left foot with a great toe that is necrotic in nature with cyanosis noted. Pulses are not palpable bilaterally, no edema.  NEUROLOGIC: Cranial nerves II through XII are intact. No focal Motor or sensory deficits b/l.   PSYCHIATRIC: The patient is alert and oriented x 3.  SKIN: No obvious rash, lesion, or ulcer.    LABORATORY PANEL:   CBC Recent Labs  Lab 09/13/17 0405  WBC 7.1  HGB 9.2*  HCT 26.5*  PLT 148*   ------------------------------------------------------------------------------------------------------------------  Chemistries  Recent Labs  Lab 09/09/17 1206  09/13/17 0405  NA 125*   < > 132*  K 4.1   < > 3.6  CL 90*   < > 99*  CO2 24   < > 25  GLUCOSE 100*   < > 78  BUN 8   < > 7  CREATININE 0.75   < > 0.61  CALCIUM 8.2*   < > 7.4*  AST 49*  --   --   ALT 23  --   --   ALKPHOS 106  --   --   BILITOT 0.8  --   --    < > = values in this interval not displayed.   ------------------------------------------------------------------------------------------------------------------  Cardiac Enzymes No results for input(s): TROPONINI in the last 168 hours. ------------------------------------------------------------------------------------------------------------------  RADIOLOGY:  No results found.   ASSESSMENT AND PLAN:   71 year old male with past medical history of seizure disorder, liver cirrhosis, diabetes, COPD, history of coronary artery disease, atrial fibrillation and presents to the hospital due to left foot pain and swelling.  1. Left foot/great toe gangrene - appreciate vascular surgery input, status post left leg angiogram, stenting SFA, popliteal.  Continue Eliquis, aspirin, still has a lot of left pain and requesting IV Dilaudid.  Continue p.o. Norco, started to p.o. Dilaudid to see if they have otherwise start on OxyContin . Podiatry today, he would  like to wait to allow demarcation to have current plan for amputation in the future on the left, okay for weightbearing on the right as per podiatry.  Patient advised to follow with podiatry in 1 week.  Patient has left great toe and partial second toe gangrenous changes, patient will be following with vascular as an outpatient, podiatry also has an outpatient.  Podiatry is not planning on right great toe amputation this hospitalization because of gangrenous changes starts started on the left also since he has been here and Dr. Ether GriffinsFowler wants t oallow demarcation to occur for amputation.  2. Essential hypertension-continue Norvasc, lisinopril.  3. History of atrial fibrillation-rate controlled. Continue sotalol.  Continue Eliquis 5 mg p.o. twice daily  4. COPD-no acute exacerbation-continue Spiriva  6. History of seizures-continue Lacosamide.   7. Neuropathy-continue gabapentin.  8. Anxiety-continue BuSpar.  #9 .recent  Right leg osteomyelitis: Continue Unasyn, wound VAC as per ID instructions. 10.  Deconditioning physical therapy consult today, possible disposition as per PT recommendation.  All the records are reviewed and case discussed with Care Management/Social Worker. Management plans discussed with the patient, family and they are in agreement.  CODE STATUS: Full code  DVT Prophylaxis: Eliquis  TOTAL TIME TAKING CARE OF THIS PATIENT: 25 minutes.   POSSIBLE D/C IN 2-3 DAYS, DEPENDING ON CLINICAL CONDITION.   Katha HammingSnehalatha Aubrionna Istre M.D on 09/14/2017 at 11:35 AM  Between 7am to 6pm - Pager - [phone]  After 6pm go to [email or web address] - Social research officer, governmentpassword EPAS [facility]  [facility] [place] [facility]  Office  [phone]  CC: Primary care physician; Sherron Mondayejan-Sie, S Ahmed, MD

## 2017-09-15 LAB — GLUCOSE, CAPILLARY: GLUCOSE-CAPILLARY: 84 mg/dL (ref 65–99)

## 2017-09-15 NOTE — Plan of Care (Signed)
  Progressing Cardiovascular: Ability to achieve and maintain adequate cardiovascular perfusion will improve 09/15/2017 0124 - Progressing by Stefan Churchogers, Audrionna Lampton M, RN Vascular access site(s) Level 0-1 will be maintained 09/15/2017 0124 - Progressing by Stefan Churchogers, Mercer Stallworth M, RN

## 2017-09-15 NOTE — [place] FL2 (Signed)
[place] MEDICAID FL2 LEVEL OF CARE SCREENING TOOL     IDENTIFICATION  Patient Name: Stanley Nguyen Birthdate: 1946/07/20 Sex: male Admission Date (Current Location): 09/09/2017  [place]   Facility and Address:  [facility], [address], [place], [place] [address]      Provider Number: [id number]  Attending Physician Name and Address:  Katha HammingKonidena, Snehalatha, MD  Relative Name and Phone Number:       Current Level of Care: Hospital Recommended Level of Care: Skilled Nursing Facility Prior Approval Number:    Date Approved/Denied:   PASRR Number: [id number]  Discharge Plan: SNF    Current Diagnoses: Patient Active Problem List   Diagnosis Date Noted  . Gangrene of toe of left foot (HCC) 09/09/2017  . Tobacco use disorder 07/26/2017  . Peripheral artery disease (HCC) 07/26/2017  . Acute osteomyelitis of metatarsal bone of right foot (HCC) 07/10/2017    Orientation RESPIRATION BLADDER Height & Weight     Self, Time, Situation  Normal Continent Weight: 156 lb 14.4 oz (71.2 kg) Height:  5\' 10"  (177.8 cm)  BEHAVIORAL SYMPTOMS/MOOD NEUROLOGICAL BOWEL NUTRITION STATUS      Continent Diet(Carb modified)  AMBULATORY STATUS COMMUNICATION OF NEEDS Skin   Extensive Assist Verbally Normal                       Personal Care Assistance Level of Assistance  Feeding, Dressing, Bathing Bathing Assistance: Limited assistance Feeding assistance: Independent Dressing Assistance: Limited assistance     Functional Limitations Info             SPECIAL CARE FACTORS FREQUENCY  PT (By licensed PT)     PT Frequency: Up to 5X per day, 5 days per week              Contractures Contractures Info: Not present    Additional Factors Info  Code Status, Allergies, Psychotropic Code Status Info: Full Allergies Info: Keppra Levetiracetam Psychotropic Info: Buspar, Celebrex, Gabapentin         Current  Medications (09/15/2017):  This is the current hospital active medication list Current Facility-Administered Medications  Medication Dose Route Frequency Provider Last Rate Last Dose  . acetaminophen (TYLENOL) tablet 650 mg  650 mg Oral Q6H PRN Katha HammingKonidena, Snehalatha, MD   650 mg at 09/10/17 [id number]   Or  . acetaminophen (TYLENOL) suppository 650 mg  650 mg Rectal Q6H PRN Katha HammingKonidena, Snehalatha, MD      . amLODipine (NORVASC) tablet 10 mg  10 mg Oral Daily Katha HammingKonidena, Snehalatha, MD   10 mg at 09/15/17 1005  . Ampicillin-Sulbactam (UNASYN) 3 g in sodium chloride 0.9 % 100 mL IVPB  3 g Intravenous Q6H Mick SellFitzgerald, David P, MD 200 mL/hr at 09/15/17 1544 3 g at 09/15/17 1544  . apixaban (ELIQUIS) tablet 5 mg  5 mg Oral BID Annice Needyew, Jason S, MD   5 mg at 09/15/17 1005  . aspirin EC tablet 81 mg  81 mg Oral Daily Katha HammingKonidena, Snehalatha, MD   81 mg at 09/15/17 1006  . bisacodyl (DULCOLAX) EC tablet 5 mg  5 mg Oral Daily PRN Katha HammingKonidena, Snehalatha, MD      . busPIRone (BUSPAR) tablet 7.5 mg  7.5 mg Oral BID Katha HammingKonidena, Snehalatha, MD   7.5 mg at 09/15/17 1007  . celecoxib (CELEBREX) capsule 100 mg  100 mg Oral Daily Katha HammingKonidena, Snehalatha, MD   100 mg at 09/15/17 1005  . docusate sodium (COLACE) capsule  100 mg  100 mg Oral BID Katha HammingKonidena, Snehalatha, MD   100 mg at 09/15/17 1005  . gabapentin (NEURONTIN) capsule 300 mg  300 mg Oral TID Katha HammingKonidena, Snehalatha, MD   300 mg at 09/15/17 1544  . HYDROcodone-acetaminophen (NORCO/VICODIN) 5-325 MG per tablet 1-2 tablet  1-2 tablet Oral Q4H PRN Katha HammingKonidena, Snehalatha, MD   2 tablet at 09/13/17 0108  . HYDROmorphone (DILAUDID) injection 1 mg  1 mg Intravenous Q2H PRN Katha HammingKonidena, Snehalatha, MD   1 mg at 09/15/17 1204  . HYDROmorphone (DILAUDID) tablet 2 mg  2 mg Oral QID Katha HammingKonidena, Snehalatha, MD   2 mg at 09/15/17 1544  . lacosamide (VIMPAT) tablet 50 mg  50 mg Oral BID Katha HammingKonidena, Snehalatha, MD   50 mg at 09/15/17 1006  . lisinopril (PRINIVIL,ZESTRIL) tablet 10 mg  10 mg Oral Daily Katha HammingKonidena,  Snehalatha, MD   10 mg at 09/15/17 1006  . nicotine (NICODERM CQ - dosed in mg/24 hours) patch 21 mg  21 mg Transdermal Daily Katha HammingKonidena, Snehalatha, MD   21 mg at 09/15/17 1005  . ondansetron (ZOFRAN) tablet 4 mg  4 mg Oral Q6H PRN Katha HammingKonidena, Snehalatha, MD       Or  . ondansetron (ZOFRAN) injection 4 mg  4 mg Intravenous Q6H PRN Katha HammingKonidena, Snehalatha, MD   4 mg at 09/10/17 0220  . ondansetron (ZOFRAN) injection 4 mg  4 mg Intravenous Q6H PRN Annice Needyew, Jason S, MD      . oxyCODONE (OXYCONTIN) 12 hr tablet 40 mg  40 mg Oral Q8H Katha HammingKonidena, Snehalatha, MD   40 mg at 09/15/17 1409  . polyethylene glycol (MIRALAX / GLYCOLAX) packet 17 g  17 g Oral Daily Houston SirenSainani, Vivek J, MD   17 g at 09/14/17 1414  . potasium chloride 40 mEq in lactated ringers 1000 mL infusion   Intravenous Continuous Merwyn KatosSimonds, David B, MD 50 mL/hr at 09/14/17 1100    . potassium chloride SA (K-DUR,KLOR-CON) CR tablet 20 mEq  20 mEq Oral Daily [place] SirenSainani, Vivek J, MD   20 mEq at 09/14/17 1038  . sodium chloride flush (NS) 0.9 % injection 3 mL  3 mL Intravenous Q12H Annice Needyew, Jason S, MD   3 mL at 09/14/17 2253  . sodium chloride flush (NS) 0.9 % injection 3 mL  3 mL Intravenous PRN Annice Needyew, Jason S, MD      . sotalol (BETAPACE) tablet 80 mg  80 mg Oral Daily Katha HammingKonidena, Snehalatha, MD   80 mg at 09/15/17 1006  . tiotropium (SPIRIVA) inhalation capsule 18 mcg  1 capsule Inhalation Daily Katha HammingKonidena, Snehalatha, MD   18 mcg at 09/15/17 1023  . zolpidem (AMBIEN) tablet 5 mg  5 mg Oral QHS PRN Katha HammingKonidena, Snehalatha, MD   5 mg at 09/14/17 2249     Discharge Medications: Please see discharge summary for a list of discharge medications.  Relevant Imaging Results:  Relevant Lab Results:   Additional Information SS# [id number]  Judi CongKaren M Mehtab Dolberry, LCSW

## 2017-09-15 NOTE — Evaluation (Addendum)
Physical Therapy Evaluation Patient Details Name: Stanley LaudBruce J Sivley MRN: [id number] DOB: 03/31/1946 Today's Date: 09/15/2017   History of Present Illness  71 y.o. male who was recently [facility] with osteomylitis of the right great Metatarsal, wearing wound vac. Now admitted for pain on L foot with great toe and second toe gangrene, after stenting and angiogram.  PMHx:  angiogram, and 2 stents placed in the left common iliac artery, A-Fib, seizure disorder, HTN, COPD, osteomyelitis, deconditioning, neuropathy, and anxiety.  Clinical Impression  Pt is up to side of bed with PT but not able to take steps or stand due to severe pain on LLE.  Has new clearance from podiatrist to stand on RLE and so with AD should be able to walk but not due to pain.  Will follow acutely for mobility and strengthening as tolerated, to progress to standing with walker for longer times and increase distance as able.      Follow Up Recommendations SNF    Equipment Recommendations  None recommended by PT    Recommendations for Other Services       Precautions / Restrictions Precautions Precautions: Fall Precaution Comments: pain on L foot with dependent posture Restrictions Weight Bearing Restrictions: No RLE Weight Bearing: Weight bearing as tolerated Other Position/Activity Restrictions: new note with WBAT Dr Ether GriffinsFowler      Mobility  Bed Mobility Overal bed mobility: Needs Assistance Bed Mobility: Supine to Sit;Sit to Supine     Supine to sit: Min guard Sit to supine: Min guard   General bed mobility comments: pt uses bed rail and mobilized himself with care due to leg pain  Transfers Overall transfer level: Needs assistance               General transfer comment: refused to attempt due to pain  Ambulation/Gait             General Gait Details: declined to attempt  Stairs            Wheelchair Mobility    Modified Rankin (Stroke Patients Only)       Balance Overall  balance assessment: Needs assistance Sitting-balance support: Bilateral upper extremity supported Sitting balance-Leahy Scale: Fair                                       Pertinent Vitals/Pain Pain Assessment: 0-10 Pain Score: 8  Pain Location: L foot when dependent Pain Descriptors / Indicators: Sharp;Stabbing Pain Intervention(s): Limited activity within patient's tolerance;Monitored during session;Premedicated before session;Repositioned    Home Living Family/patient expects to be discharged to:: Private residence Living Arrangements: Spouse/significant other Available Help at Discharge: Family;Available 24 hours/day Type of Home: House Home Access: Stairs to enter Entrance Stairs-Rails: Right Entrance Stairs-Number of Steps: 2 Home Layout: One level Home Equipment: Walker - 2 wheels;Wheelchair - manual Additional Comments: pt asking to go to rehab due to pain LLE    Prior Function Level of Independence: Needs assistance   Gait / Transfers Assistance Needed: was using wc to mobilize at home  ADL's / Homemaking Assistance Needed: wife assists with house and pt can dress himself        Hand Dominance   Dominant Hand: Right    Extremity/Trunk Assessment   Upper Extremity Assessment Upper Extremity Assessment: Overall WFL for tasks assessed    Lower Extremity Assessment Lower Extremity Assessment: LLE deficits/detail;RLE deficits/detail RLE Deficits / Details: wound vac  on R foot due to osteomyelitis RLE: Unable to fully assess due to pain;Unable to fully assess due to immobilization RLE Coordination: decreased fine motor;decreased gross motor LLE Deficits / Details: has severe foot pain and cannot tolerate touching LLE LLE: Unable to fully assess due to pain LLE Coordination: decreased fine motor;decreased gross motor    Cervical / Trunk Assessment Cervical / Trunk Assessment: Normal  Communication   Communication: No difficulties  Cognition  Arousal/Alertness: Awake/alert Behavior During Therapy: WFL for tasks assessed/performed Overall Cognitive Status: Within Functional Limits for tasks assessed                                        General Comments General comments (skin integrity, edema, etc.): has vac on R foot and tolerating no touch on LLE but noted dry dark skin on L toes, R is healthier with fac in center of foot    Exercises     Assessment/Plan    PT Assessment Patient needs continued PT services  PT Problem List Decreased strength;Decreased range of motion;Decreased activity tolerance;Decreased balance;Decreased mobility;Decreased coordination;Impaired sensation;Decreased skin integrity;Pain       PT Treatment Interventions DME instruction;Gait training;Functional mobility training;Stair training;Therapeutic activities;Therapeutic exercise;Balance training;Neuromuscular re-education;Patient/family education    PT Goals (Current goals can be found in the Care Plan section)  Acute Rehab PT Goals Patient Stated Goal: to get pain managed better PT Goal Formulation: With patient Time For Goal Achievement: 09/29/17 Potential to Achieve Goals: Fair    Frequency Min 2X/week   Barriers to discharge Inaccessible home environment has stairs to enter house    Co-evaluation               AM-PAC PT "6 Clicks" Daily Activity  Outcome Measure Difficulty turning over in bed (including adjusting bedclothes, sheets and blankets)?: A Little Difficulty moving from lying on back to sitting on the side of the bed? : A Little Difficulty sitting down on and standing up from a chair with arms (e.g., wheelchair, bedside commode, etc,.)?: A Little Help needed moving to and from a bed to chair (including a wheelchair)?: Total Help needed walking in hospital room?: Total Help needed climbing 3-5 steps with a railing? : Total 6 Click Score: 12    End of Session   Activity Tolerance: Patient limited by  pain Patient left: in bed;with call bell/phone within reach;with bed alarm set Nurse Communication: Mobility status PT Visit Diagnosis: Muscle weakness (generalized) (M62.81);Pain Pain - Right/Left: Left Pain - part of body: Ankle and joints of foot    Time: [id number] PT Time Calculation (min) (ACUTE ONLY): 29 min   Charges:   PT Evaluation $PT Eval Moderate Complexity: 1 Mod PT Treatments $Therapeutic Activity: 8-22 mins   PT G Codes:   PT G-Codes **NOT FOR INPATIENT CLASS** Functional Assessment Tool Used: AM-PAC 6 Clicks Basic Mobility    Ivar DrapeRuth E Kinesha Auten 09/15/2017, 1:34 PM  Samul Dadauth Kealan Buchan, PT MS Acute Rehab Dept. Number: [facility] [phone] and [facility] [phone]

## 2017-09-15 NOTE — Progress Notes (Signed)
[facility] [place] [facility]   PATIENT NAME: Stanley Nguyen    MR#:  [id number]  DATE OF BIRTH:  06/08/1946  SUBJECTIVE: Patient still has severe left foot  Pain. unable to ambulate due to that.    status post angioplasty and stent placement, mechanical thrombectomy by vascular today.  REVIEW OF SYSTEMS:    Review of Systems  Constitutional: Negative for chills and fever.  HENT: Negative for congestion and tinnitus.   Eyes: Negative for blurred vision and double vision.  Respiratory: Negative for cough, shortness of breath and wheezing.   Cardiovascular: Negative for chest pain, orthopnea and PND.  Gastrointestinal: Negative for abdominal pain, diarrhea, nausea and vomiting.  Genitourinary: Negative for dysuria and hematuria.  Musculoskeletal: Positive for joint pain (left foot/great Toe. ).  Neurological: Negative for dizziness, sensory change and focal weakness.  All other systems reviewed and are negative.   Nutrition: Heart healthy Tolerating Diet: Yes Tolerating PT: Await Eval.    DRUG ALLERGIES:   Allergies  Allergen Reactions  . Keppra [Levetiracetam] Rash    VITALS:  Blood pressure (!) 118/53, pulse 68, temperature 98.1 F (36.7 C), temperature source Oral, resp. rate 18, height 5\' 10"  (1.778 m), weight 71.2 kg (156 lb 14.4 oz), SpO2 94 %.  PHYSICAL EXAMINATION:   Physical Exam  GENERAL:  71 y.o.-year-old patient lying in bed in mild distress with left foot pain.   EYES: Pupils equal, round, reactive to light and accommodation. No scleral icterus. Extraocular muscles intact.  HEENT: Head atraumatic, normocephalic. Oropharynx and nasopharynx clear.  NECK:  Supple, no jugular venous distention. No thyroid enlargement, no tenderness.  LUNGS: Normal breath sounds bilaterally, no wheezing, rales, rhonchi. No use of accessory muscles of respiration.  CARDIOVASCULAR: S1, S2 normal. No murmurs, rubs, or gallops.  ABDOMEN: Soft, nontender,  nondistended. Bowel sounds present. No organomegaly or mass.  EXTREMITIES: Right foot with a wound VAC in place, left foot with a great toe  And second toe that is necrotic in nature with cyanosis noted. Pulses are not palpable bilaterally, no edema.  NEUROLOGIC: Cranial nerves II through XII are intact. No focal Motor or sensory deficits b/l.   PSYCHIATRIC: The patient is alert and oriented x 3.  SKIN: No obvious rash, lesion, or ulcer.    LABORATORY PANEL:   CBC Recent Labs  Lab 09/13/17 0405  WBC 7.1  HGB 9.2*  HCT 26.5*  PLT 148*   ------------------------------------------------------------------------------------------------------------------  Chemistries  Recent Labs  Lab 09/09/17 1206  09/13/17 0405  NA 125*   < > 132*  K 4.1   < > 3.6  CL 90*   < > 99*  CO2 24   < > 25  GLUCOSE 100*   < > 78  BUN 8   < > 7  CREATININE 0.75   < > 0.61  CALCIUM 8.2*   < > 7.4*  AST 49*  --   --   ALT 23  --   --   ALKPHOS 106  --   --   BILITOT 0.8  --   --    < > = values in this interval not displayed.   ------------------------------------------------------------------------------------------------------------------  Cardiac Enzymes No results for input(s): TROPONINI in the last 168 hours. ------------------------------------------------------------------------------------------------------------------  RADIOLOGY:  No results found.   ASSESSMENT AND PLAN:   71 year old male with past medical history of seizure disorder, liver cirrhosis, diabetes, COPD, history of coronary artery disease, atrial fibrillation and presents to the hospital due  to left foot pain and swelling.  1. Left foot/great toe gangrene - gangrene  spead on the left foot second toe  status post left leg angiogram, stenting SFA, popliteal.  Continue Eliquis, aspirin, still has a lot of left pain and requesting IV Dilaudid.   New OxyContin 40 mg every 8 hours, continue Dilaudid 2 mg 4 times daily, patient  already on Norco for moderate pain still having a lot of pain issues and unable to participate in physical therapy.  Continue IV pain p.o. pain medicines and watch him for at least another 24 hours and see if left foot pain get sbetter for him to have therapy DR.Varney BaasFowler  Says he would like to wait to allow demarcation to have current plan for amputation in the future on the left, okay for weightbearing on the right as per podiatry.  Patient advised to follow with podiatry in 1 week.  Patient has left great toe and partial second toe gangrenous changes, patient will be following with vascular as an outpatient, podiatry also has an outpatient.  Podiatry is not planning on right great toe amputation this hospitalization because of gangrenous changes starts started on the left also since he has been here and Dr. Ether GriffinsFowler wants t oallow demarcation to occur for amputation.   2. Essential hypertension-continue Norvasc, lisinopril.  3. History of atrial fibrillation-rate controlled. Continue sotalol.  Continue Eliquis 5 mg p.o. twice daily  4. COPD-no acute exacerbation-continue Spiriva  6. History of seizures-continue Lacosamide.   7. Neuropathy-continue gabapentin.  8. Anxiety-continue BuSpar. 9. insomnia: Patient requesting Ambien 10 mg at night.  #9 .recent  Right leg osteomyelitis: Continue Unasyn, wound VAC as per ID instructions.  10.  Deconditioning ; possible disposition as per PT recommendation.  All the records are reviewed and case discussed with Care Management/Social Worker. Management plans discussed with the patient, family and they are in agreement.  CODE STATUS: Full code  DVT Prophylaxis: Eliquis  TOTAL TIME TAKING CARE OF THIS PATIENT: 25 minutes.   POSSIBLE D/C IN 2-3 DAYS, DEPENDING ON CLINICAL CONDITION.   Katha HammingSnehalatha Kameela Leipold M.D on 09/15/2017 at 12:50 PM  Between 7am to 6pm - Pager - [phone]  After 6pm go to [email or web address] - Social research officer, governmentpassword EPAS [facility]  [facility] [place] [facility]  Office  [phone]  CC: Primary care physician; Sherron Mondayejan-Sie, S Ahmed, MD

## 2017-09-16 ENCOUNTER — Encounter: Payer: Self-pay | Admitting: Vascular Surgery

## 2017-09-16 LAB — GLUCOSE, CAPILLARY
GLUCOSE-CAPILLARY: 104 mg/dL — AB (ref 65–99)
Glucose-Capillary: 89 mg/dL (ref 65–99)

## 2017-09-16 MED ORDER — RIVAROXABAN 20 MG PO TABS
20.0000 mg | ORAL_TABLET | Freq: Every day | ORAL | Status: DC
  Administered 2017-09-16: 20 mg via ORAL
  Filled 2017-09-16: qty 1

## 2017-09-16 NOTE — Care Management Important Message (Signed)
Important Message  Patient Details  Name: Stanley LaudBruce J Weimann MRN: [id number] Date of Birth: [birth year]   Medicare Important Message Given:  Yes  Signed IM notice given   Eber HongGreene, Ardena Gangl R, RN 09/16/2017, 5:08 PM

## 2017-09-16 NOTE — Care Management (Signed)
Patient is declining skilled nursing recommendation.  Says he is receiving home health but " I don't know who it is.. call my wife-she knows all of that."  Spoke with patient's wife who is tearful at times.  Says patient is very demanding and hard headed.  He wants to come home and there is no changing his mind.  Patient must navigate steps to get inside the house.  Says she will "call Greggory StallionGeorge" to help get patient in the house.  CM discussed ems transport. Documentation show patient was on xarelto 05/2017 and started Eliquis  11/16.  Patient does not know if he was on xarelto or if Eliquis is new.  Wife says she "can not think."  CM will readdress tomorrow to determine if 30 day trial coupon will be needed. Notified [facility] of potential discharge 09/17/2017.  Will obtain orders for SN PT OT Aide and SW.  Patient has chronic wound vac

## 2017-09-16 NOTE — Progress Notes (Signed)
[facility]  Daily Progress Note   Subjective  - 3 Days Post-Op  Foot warm, less pain.  First and second toes are demarcating  Objective Vitals:   09/15/17 0434 09/15/17 1937 09/16/17 0526 09/16/17 0855  BP: (!) 118/53 (!) 144/56 (!) 124/46 (!) 173/57  Pulse: 68 66 66 73  Resp: 18 17 17 18   Temp: 98.1 F (36.7 C) 97.8 F (36.6 C) 98.2 F (36.8 C) 97.8 F (36.6 C)  TempSrc: Oral Oral Oral   SpO2: 94% 98% 96% 96%  Weight: 71.2 kg (156 lb 14.4 oz)  71 kg (156 lb 8 oz)   Height:        Intake/Output Summary (Last 24 hours) at 09/16/2017 1313 Last data filed at 09/16/2017 1312 Gross per 24 hour  Intake 614.67 ml  Output 2275 ml  Net -1660.33 ml    PULM  CTAB CV  RRR VASC  1+ left PT pulse, foot warm with good capillary refill except toes one and two which have dry gangrene  Laboratory CBC    Component Value Date/Time   WBC 7.1 09/13/2017 0405   HGB 9.2 (L) 09/13/2017 0405   HGB 8.1 (L) 08/04/2013 0333   HCT 26.5 (L) 09/13/2017 0405   HCT 24.9 (L) 08/03/2013 0456   PLT 148 (L) 09/13/2017 0405   PLT 142 (L) 08/03/2013 0456    BMET    Component Value Date/Time   NA 132 (L) 09/13/2017 0405   NA 132 (L) 08/04/2013 0333   K 3.6 09/13/2017 0405   K 4.4 08/04/2013 0333   CL 99 (L) 09/13/2017 0405   CL 100 08/04/2013 0333   CO2 25 09/13/2017 0405   CO2 27 08/04/2013 0333   GLUCOSE 78 09/13/2017 0405   GLUCOSE 69 08/04/2013 0333   BUN 7 09/13/2017 0405   BUN 3 (L) 08/04/2013 0333   CREATININE 0.61 09/13/2017 0405   CREATININE 0.87 08/04/2013 0333   CALCIUM 7.4 (L) 09/13/2017 0405   CALCIUM 7.6 (L) 08/04/2013 0333   GFRNONAA >60 09/13/2017 0405   GFRNONAA >60 08/04/2013 0333   GFRAA >60 09/13/2017 0405   GFRAA >60 08/04/2013 0333    Assessment/Planning: POD #3 s/p LLE revascularization   Continue current medical regimen  Okay for toe amputations whenever felt appropriate by podiatry.  That can be done on this admission or later  when the toes demarcate further at their discretion.  The patient prefers to be performed sooner, but allowing further demarcation could also be helpful.  The patient also complains of insomnia and says he takes more Ambien at home that he is given here.  Certainly okay from a vascular standpoint if this is increased if the primary service feels this to be safe.  No other recommendations from a vascular standpoint so will sign off.  Please follow-up with me in the office in 3-4 weeks with ABIs    Festus BarrenJason Dew  09/16/2017, 1:13 PM

## 2017-09-16 NOTE — Anesthesia Postprocedure Evaluation (Signed)
Anesthesia Post Note  Patient: Quentin CornwallBruce J Baglio  Procedure(s) Performed: Lower Extremity Angiography (Left )  Patient location during evaluation: PACU Anesthesia Type: General Level of consciousness: awake and alert Pain management: pain level controlled Vital Signs Assessment: post-procedure vital signs reviewed and stable Respiratory status: spontaneous breathing, nonlabored ventilation, respiratory function stable and patient connected to nasal cannula oxygen Cardiovascular status: blood pressure returned to baseline and stable Postop Assessment: no apparent nausea or vomiting Anesthetic complications: no     Last Vitals:  Vitals:   09/16/17 0526 09/16/17 0855  BP: (!) 124/46 (!) 173/57  Pulse: 66 73  Resp: 17 18  Temp: 36.8 C 36.6 C  SpO2: 96% 96%    Last Pain:  Vitals:   09/16/17 1818  TempSrc:   PainSc: 7                  Lenard SimmerAndrew Kayah Hecker

## 2017-09-16 NOTE — Progress Notes (Signed)
[facility] - [place] at [facility]   PATIENT NAME: Stanley CairoBruce Nguyen    MR#:  [id number]  DATE OF BIRTH:  June 20, 1946  SUBJECTIVE:  CHIEF COMPLAINT:   Chief Complaint  Patient presents with  . Wound Infection  no new issues. Wants to go home tomorrow, refuses STR/SNF, not happy for not getting his amputation this admission REVIEW OF SYSTEMS:  Review of Systems  Constitutional: Negative for chills, fever and weight loss.  HENT: Negative for nosebleeds and sore throat.   Eyes: Negative for blurred vision.  Respiratory: Negative for cough, shortness of breath and wheezing.   Cardiovascular: Negative for chest pain, orthopnea, leg swelling and PND.  Gastrointestinal: Negative for abdominal pain, constipation, diarrhea, heartburn, nausea and vomiting.  Genitourinary: Negative for dysuria and urgency.  Musculoskeletal: Positive for joint pain. Negative for back pain.  Skin: Negative for rash.  Neurological: Negative for dizziness, speech change, focal weakness and headaches.  Endo/Heme/Allergies: Does not bruise/bleed easily.  Psychiatric/Behavioral: Negative for depression.    DRUG ALLERGIES:   Allergies  Allergen Reactions  . Keppra [Levetiracetam] Rash   VITALS:  Blood pressure (!) 173/57, pulse 73, temperature 97.8 F (36.6 C), resp. rate 18, height 5\' 10"  (1.778 m), weight 71 kg (156 lb 8 oz), SpO2 96 %. PHYSICAL EXAMINATION:  Physical Exam  Constitutional: He is oriented to person, place, and time and well-developed, well-nourished, and in no distress.  HENT:  Head: Normocephalic and atraumatic.  Eyes: Conjunctivae and EOM are normal. Pupils are equal, round, and reactive to light.  Neck: Normal range of motion. Neck supple. No tracheal deviation present. No thyromegaly present.  Cardiovascular: Normal rate, regular rhythm and normal heart sounds.  Pulmonary/Chest: Effort normal and breath sounds normal. No respiratory distress. He has no wheezes. He exhibits  no tenderness.  Abdominal: Soft. Bowel sounds are normal. He exhibits no distension. There is no tenderness.  Musculoskeletal: Normal range of motion.  Neurological: He is alert and oriented to person, place, and time. No cranial nerve deficit.  Skin: Skin is warm and dry. Rash noted.  Rt foot in dressing + Lt great toe and 2nd toe gangrenous changes  Psychiatric: Mood and affect normal.   LABORATORY PANEL:  Male CBC Recent Labs  Lab 09/13/17 0405  WBC 7.1  HGB 9.2*  HCT 26.5*  PLT 148*   ------------------------------------------------------------------------------------------------------------------ Chemistries  Recent Labs  Lab 09/13/17 0405  NA 132*  K 3.6  CL 99*  CO2 25  GLUCOSE 78  BUN 7  CREATININE 0.61  CALCIUM 7.4*   RADIOLOGY:  No results found. ASSESSMENT AND PLAN:  71 year old male with past medical history of seizure disorder, liver cirrhosis, diabetes, COPD, history of coronary artery disease, atrial fibrillation and presents to the hospital due to left foot pain and swelling.  1. Left foot/great toe gangrene - gangrene  spead on the left foot second toe  status post left leg angiogram, stenting SFA, popliteal.  Continue aspirin, still has a lot of left pain  - Dr.Fowler & Vascular - would like to wait to allow demarcation to have current plan for amputation in the future on the left, okay for weightbearing on the right as per podiatry.  - Unsure When Xarelto was switched over to Eliquis (likely during this Hospitalization, He was started on heparin drip for Angio and Xarelto was held, He could have been started on Eliquis without knowing he was on Xarelto at home). WIll switch him back to Xarelto as that's his home  meds - ID recommends changing to PO Augmentin at D/C  2. Essential hypertension-continue Norvasc, lisinopril.  3. History of atrial fibrillation-rate controlled. Continue sotalol.  Continue Xarelto  4. COPD-no acute exacerbation-continue  Spiriva  6. History of seizures-continue Lacosamide.   7. Neuropathy-continue gabapentin.  8. Anxiety-continue BuSpar.  9. insomnia: Patient requesting Ambien 10 mg at night.  #9 .recent  Right leg osteomyelitis: Continue Unasyn, wound VAC as per ID instructions.  10.  Deconditioning ; possible disposition as per PT recommendation.       All the records are reviewed and case discussed with Care Management/Social Worker. Management plans discussed with the patient, family and they are in agreement.  CODE STATUS: Full Code  TOTAL TIME TAKING CARE OF THIS PATIENT: 35 minutes.   More than 50% of the time was spent in counseling/coordination of care: YES  POSSIBLE D/C IN 1 DAYS, DEPENDING ON CLINICAL CONDITION.   Delfino LovettVipul Hatim Homann M.D on 09/16/2017 at 7:39 PM  Between 7am to 6pm - Pager - [phone]  After 6pm go to [email or web address] - Social research officer, governmentpassword EPAS [facility]  [facility] [place] [facility]  Office  [phone]  CC: Primary care physician; Sherron Mondayejan-Sie, S Ahmed, MD  Note: This dictation was prepared with Dragon dictation along with smaller phrase technology. Any transcriptional errors that result from this process are unintentional.

## 2017-09-16 NOTE — Plan of Care (Signed)
  Not Progressing Pain Managment: General experience of comfort will improve 09/16/2017 0315 - Not Progressing by Stefan Churchogers, Luvina Poirier M, RN

## 2017-09-16 NOTE — Consult Note (Signed)
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  NPWT was discontinued and NS gauze dressing started.  Re-application of NPWT initiated at this time.  Measurement: 3.3 cm x 2.4 cm x 0.3 cm with visible tendon in wound bed.  Applied silicone contact layer Wound bed:75% pale pink nongranulating Drainage (amount, consistency, odor) minimal serosanguinous  No odor Periwound:itnact.  Will bridge dressing to anterior lower leg due to nonintact seal and alarms yesterday.  Dressing procedure/placement/frequency: Cleanse wound with NS.  Pat dry.  Apply silicone contact layer to wound bed.  Fill wound bed with drape and bridge to lower leg.  Cover with drape.  Change Monday/Wednesday/Friday.  WOC team will follow.  Maple HudsonKaren Ece Cumberland RN BSN CWON Pager [phone]

## 2017-09-16 NOTE — Clinical Social Work Note (Addendum)
CSW received consult for patient, he is wanting to go home with home health, case manager is aware.  Patient is in copay days and he does not feel like SNF PT will help him.  [facility].  CSW to continue to follow in case patient changes his mind.  Formal assessment to follow.  Stanley KnackEric R. Kalan Nguyen, MSW, Theresia MajorsLCSWA [phone]  09/16/2017 6:12 PM

## 2017-09-17 LAB — BASIC METABOLIC PANEL
Anion gap: 5 (ref 5–15)
BUN: 7 mg/dL (ref 6–20)
CALCIUM: 7.7 mg/dL — AB (ref 8.9–10.3)
CHLORIDE: 98 mmol/L — AB (ref 101–111)
CO2: 27 mmol/L (ref 22–32)
CREATININE: 0.67 mg/dL (ref 0.61–1.24)
Glucose, Bld: 83 mg/dL (ref 65–99)
Potassium: 4.3 mmol/L (ref 3.5–5.1)
SODIUM: 130 mmol/L — AB (ref 135–145)

## 2017-09-17 LAB — GLUCOSE, CAPILLARY
GLUCOSE-CAPILLARY: 87 mg/dL (ref 65–99)
GLUCOSE-CAPILLARY: 110 mg/dL — AB (ref 65–99)

## 2017-09-17 LAB — CBC
HCT: 26.1 % — ABNORMAL LOW (ref 40.0–52.0)
HEMOGLOBIN: 9.1 g/dL — AB (ref 13.0–18.0)
MCH: 30.2 pg (ref 26.0–34.0)
MCHC: 34.9 g/dL (ref 32.0–36.0)
MCV: 86.7 fL (ref 80.0–100.0)
Platelets: 175 10*3/uL (ref 150–440)
RBC: 3.01 MIL/uL — AB (ref 4.40–5.90)
RDW: 13.5 % (ref 11.5–14.5)
WBC: 8.5 10*3/uL (ref 3.8–10.6)

## 2017-09-17 MED ORDER — OXYCODONE HCL ER 40 MG PO T12A: 40.0000 mg | EXTENDED_RELEASE_TABLET | Freq: Three times a day (TID) | ORAL | 0 refills | Status: DC

## 2017-09-17 MED ORDER — ZOLPIDEM TARTRATE 10 MG PO TABS: 10.0000 mg | ORAL_TABLET | Freq: Every evening | ORAL | 0 refills | Status: DC | PRN

## 2017-09-17 MED ORDER — OCUVITE-LUTEIN PO CAPS
1.0000 | ORAL_CAPSULE | Freq: Every day | ORAL | Status: DC
  Filled 2017-09-17: qty 1

## 2017-09-17 MED ORDER — AMOXICILLIN-POT CLAVULANATE 875-125 MG PO TABS: 1.0000 | ORAL_TABLET | Freq: Two times a day (BID) | ORAL | 0 refills | Status: AC

## 2017-09-17 NOTE — Progress Notes (Signed)
F/u left great toe gangrene.   Demarcation is slowly occurring at this time.  Will likely be limited to great toe and 2nd toe.  Surrounding skin is slightly erythematous.  No purulence. D/W pt that I still recommend waiting 1 more week to allow demarcation to occur.  Pt expressed understanding. C/W wound vac on right foot. Dry dressing on left. WBAT to left  F/u with me next week.

## 2017-09-17 NOTE — Progress Notes (Signed)
[facility] INFECTIOUS DISEASE PROGRESS NOTE Date of Admission:  09/09/2017     ID: Quentin CornwallBruce J Gawlik is a 71 y.o. male with L foot and toe gangrene, R foot osteomyelitis Active Problems:   Gangrene of toe of left foot (HCC)   Subjective: Ready for dc  No fevers   ROS  Eleven systems are reviewed and negative except per hpi  Medications:  Antibiotics Given (last 72 hours)    Date/Time Action Medication Dose Rate   09/14/17 1414 New Bag/Given   Ampicillin-Sulbactam (UNASYN) 3 g in sodium chloride 0.9 % 100 mL IVPB  200 mL/hr   09/14/17 2037 New Bag/Given   Ampicillin-Sulbactam (UNASYN) 3 g in sodium chloride 0.9 % 100 mL IVPB 3 g 200 mL/hr   09/15/17 0220 New Bag/Given   Ampicillin-Sulbactam (UNASYN) 3 g in sodium chloride 0.9 % 100 mL IVPB 3 g 200 mL/hr   09/15/17 1000 New Bag/Given   Ampicillin-Sulbactam (UNASYN) 3 g in sodium chloride 0.9 % 100 mL IVPB 3 g 200 mL/hr   09/15/17 1544 New Bag/Given   Ampicillin-Sulbactam (UNASYN) 3 g in sodium chloride 0.9 % 100 mL IVPB 3 g 200 mL/hr   09/15/17 2018 New Bag/Given   Ampicillin-Sulbactam (UNASYN) 3 g in sodium chloride 0.9 % 100 mL IVPB 3 g 200 mL/hr   09/16/17 0307 New Bag/Given   Ampicillin-Sulbactam (UNASYN) 3 g in sodium chloride 0.9 % 100 mL IVPB 3 g 200 mL/hr   09/16/17 0949 New Bag/Given   Ampicillin-Sulbactam (UNASYN) 3 g in sodium chloride 0.9 % 100 mL IVPB 3 g 200 mL/hr   09/16/17 1511 New Bag/Given   Ampicillin-Sulbactam (UNASYN) 3 g in sodium chloride 0.9 % 100 mL IVPB 3 g 200 mL/hr   09/16/17 2025 New Bag/Given   Ampicillin-Sulbactam (UNASYN) 3 g in sodium chloride 0.9 % 100 mL IVPB 3 g 200 mL/hr   09/17/17 0504 New Bag/Given   Ampicillin-Sulbactam (UNASYN) 3 g in sodium chloride 0.9 % 100 mL IVPB 3 g 200 mL/hr     . amLODipine  10 mg Oral Daily  . aspirin EC  81 mg Oral Daily  . busPIRone  7.5 mg Oral BID  . docusate sodium  100 mg Oral BID  . gabapentin  300 mg Oral TID  . HYDROmorphone  2 mg Oral QID  .  lacosamide  50 mg Oral BID  . lisinopril  10 mg Oral Daily  . multivitamin-lutein  1 capsule Oral Daily  . nicotine  21 mg Transdermal Daily  . oxyCODONE  40 mg Oral Q8H  . polyethylene glycol  17 g Oral Daily  . potassium chloride  20 mEq Oral Daily  . rivaroxaban  20 mg Oral Q supper  . sodium chloride flush  3 mL Intravenous Q12H  . sotalol  80 mg Oral Daily  . tiotropium  1 capsule Inhalation Daily    Objective: Vital signs in last 24 hours: Temp:  [98.2 F (36.8 C)-98.6 F (37 C)] 98.2 F (36.8 C) (11/20 1412) Pulse Rate:  [63-75] 75 (11/20 1412) Resp:  [18-20] 20 (11/20 0901) BP: (143-168)/(53-66) 168/66 (11/20 1412) SpO2:  [96 %-98 %] 98 % (11/20 1412) Weight:  [70.8 kg (156 lb)] 70.8 kg (156 lb) (11/20 0419) Constitutional: He is oriented to person, place, and time.thin, frail  HENT: anicteric  Mouth/Throat: Oropharynx is clear and moist. No oropharyngeal exudate.  Cardiovascular: Normal rate, regular rhythm and normal heart sounds. Pulmonary/Chest: Effort normal and breath sounds normal. No respiratory distress. He  has no wheezes.  Abdominal: Soft. Bowel sounds are normal. He exhibits no distension. There is no tenderness.  Lymphadenopathy: He has no cervical adenopathy.  Neurological: He is alert and oriented to person, place, and time.  Skin: Derm:The dorsal aspect of his right foot has wound vac L foot with gangrene of great toe Psychiatric: He has a normal mood and affect. His behavior is normal.   Lab Results Recent Labs    09/17/17 0503  WBC 8.5  HGB 9.1*  HCT 26.1*  NA 130*  K 4.3  CL 98*  CO2 27  BUN 7  CREATININE 0.67    Microbiology: Results for orders placed or performed during the hospital encounter of 07/10/17  Wound or Superficial Culture     Status: None   Collection Time: 07/10/17  4:30 PM  Result Value Ref Range Status   Specimen Description FOOT  Final   Special Requests NONE  Final   Gram Stain   Final    RARE WBC SEEN FEW GRAM  POSITIVE COCCI Performed at [facility], [address], [place], [place] [address]    Culture MODERATE STAPHYLOCOCCUS AUREUS  Final   Report Status 07/13/2017 FINAL  Final   Organism ID, Bacteria STAPHYLOCOCCUS AUREUS  Final      Susceptibility   Staphylococcus aureus - MIC*    CIPROFLOXACIN 1 SENSITIVE Sensitive     ERYTHROMYCIN <=0.25 SENSITIVE Sensitive     GENTAMICIN <=0.5 SENSITIVE Sensitive     OXACILLIN 0.5 SENSITIVE Sensitive     TETRACYCLINE <=1 SENSITIVE Sensitive     VANCOMYCIN <=0.5 SENSITIVE Sensitive     TRIMETH/SULFA <=10 SENSITIVE Sensitive     CLINDAMYCIN <=0.25 SENSITIVE Sensitive     RIFAMPIN <=0.5 SENSITIVE Sensitive     Inducible Clindamycin NEGATIVE Sensitive     * MODERATE STAPHYLOCOCCUS AUREUS  Culture, blood (routine x 2)     Status: None   Collection Time: 07/10/17  4:30 PM  Result Value Ref Range Status   Specimen Description BLOOD BLOOD LEFT FOREARM  Final   Special Requests   Final    BOTTLES DRAWN AEROBIC AND ANAEROBIC Blood Culture results may not be optimal due to an excessive volume of blood received in culture bottles   Culture NO GROWTH 5 DAYS  Final   Report Status 07/15/2017 FINAL  Final  Culture, blood (routine x 2)     Status: None   Collection Time: 07/10/17  4:30 PM  Result Value Ref Range Status   Specimen Description BLOOD LEFT ANTECUBITAL  Final   Special Requests   Final    BOTTLES DRAWN AEROBIC AND ANAEROBIC Blood Culture results may not be optimal due to an excessive volume of blood received in culture bottles   Culture NO GROWTH 5 DAYS  Final   Report Status 07/15/2017 FINAL  Final  Surgical pcr screen     Status: Abnormal   Collection Time: 07/10/17  9:05 PM  Result Value Ref Range Status   MRSA, PCR NEGATIVE NEGATIVE Final   Staphylococcus aureus POSITIVE (A) NEGATIVE Final    Comment: (NOTE) The Xpert SA Assay (FDA approved for NASAL specimens in patients 87 years of age and older), is one component of a  comprehensive surveillance program. It is not intended to diagnose infection nor to guide or monitor treatment.   Aerobic/Anaerobic Culture (surgical/deep wound)     Status: None   Collection Time: 07/12/17  9:57 AM  Result Value Ref Range Status   Specimen  Description FOOT RIGHT first metatarsal SWAB  Final   Special Requests NONE  Final   Gram Stain   Final    FEW WBC PRESENT, PREDOMINANTLY PMN FEW GRAM POSITIVE COCCI IN CLUSTERS    Culture   Final    FEW STAPHYLOCOCCUS AUREUS FEW STREPTOCOCCUS ANGINOSIS SEE STAPH AUREUS SUSCEPTIBILITIES IN RESULTS REVIEW / OTHER TESTS/ MISC LAB TEST SECTION FOR 08/05/17 Performed at [facility], [address] [place], [place] [address]    Report Status 08/05/2017 FINAL  Final   Organism ID, Bacteria STREPTOCOCCUS ANGINOSIS  Final      Susceptibility   Streptococcus anginosis - MIC*    PENICILLIN <=0.06 SENSITIVE Sensitive     CEFTRIAXONE 0.25 SENSITIVE Sensitive     ERYTHROMYCIN <=0.12 SENSITIVE Sensitive     LEVOFLOXACIN 0.5 SENSITIVE Sensitive     VANCOMYCIN 1 SENSITIVE Sensitive     * FEW STREPTOCOCCUS ANGINOSIS  Susceptibility, Aer + Anaerob     Status: None   Collection Time: 07/12/17  9:57 AM  Result Value Ref Range Status   Suscept, Aer + Anaerob Final report  Corrected    Comment: (NOTE) Performed At: [facility] [address] [place], [place] [id number] Mila HomerHancock William F MD XB:[id number]:[phone] CORRECTED ON 09/20 AT 1731: PREVIOUSLY REPORTED AS Preliminary report    Source of Sample WOUND  Final    Comment: STAPHYLOCOCCUS AUREUS Performed at [facility], [address] [place], [place] [address]   Susceptibility Result     Status: None   Collection Time: 07/12/17  9:57 AM  Result Value Ref Range Status   Suscept Result 1 Staphylococcus aureus  Final    Comment: (NOTE) Identification performed by account, not confirmed by this laboratory. Based on resistance to penicillin and susceptibility to  oxacillin this isolate would be susceptible to: * Penicillinase-stable penicillins; such as:    Cloxacillin    Dicloxacillin    Nafcillin * Beta-lactam/beta-lactamase inhibitor combinations; such as:    Amoxicillin-clavulanic acid    Ampicillin-sulbactam * Antistaphylococcal cephems; such as:    Cefaclor    Cefuroxime * Antistaphylococcal carbapenems; such as:    Imipenem    Meropenem Clindamycin      S Erythromycin     S    Antimicrobial Suscept Comment  Final    Comment: (NOTE)      ** S = Susceptible; I = Intermediate; R = Resistant **                   P = Positive; N = Negative            MICS are expressed in micrograms per mL   Antibiotic                 RSLT#1    RSLT#2    RSLT#3    RSLT#4 Ciprofloxacin                  S Clindamycin                    S Erythromycin                   S Gentamicin                     S Levofloxacin                   S Linezolid  S Moxifloxacin                   S Nitrofurantoin                 S Oxacillin                      S Penicillin                     R Quinupristin/Dalfopristin      S Rifampin                       S Tetracycline                   S Trimethoprim/Sulfa             S Vancomycin                     S Performed At: [facility] [address] [place], [place] [id number] Mila Homer MD WU:[id number] Performed at [facility], [address], [place], [place] [address]     Studies/Results: No results found.  Assessment/Plan: MALCOM SELMER is a 71 y.o. male with L foot gangrene mainly of great toe while also being treated for R foot osteomyelitis.  Prior cx from R foot with MSSA and Strep anginosis and he had been treated with IV abx and recently changed to augmentin (was going to use keflex in place of augmentin if he was improving). His R foot seems to be improving but L foot developed gangrene and has had extensive revascularization. Plan if for later amputation  after demarcation.   Recommendations Continue unasyn while inpatient but can change to augmentin at dc Would plan on continuing abx until after toe amputation and for a 10 day period following that.  Thank you very much for the consult. Will follow with you.  Mick Sell   09/17/2017, 2:13 PM

## 2017-09-17 NOTE — Clinical Social Work Note (Signed)
CSW received referral for SNF.  Case discussed with case manager and plan is to discharge home with home health.  CSW to sign off please re-consult if social work needs arise.  Ossiel Marchio R. Saaya Procell, MSW, LCSWA [phone]  

## 2017-09-17 NOTE — Progress Notes (Signed)
IV and tele removed from patient. Discharge instructions given to patient. Verbalized understanding. No distress at this time. Wife given hard copy prescriptions. EMS transporting patient home.

## 2017-09-17 NOTE — Discharge Instructions (Signed)
Gangrene °Gangrene is a medical condition caused by a lack of blood supply to an area of your body. Oxygen travels through your blood, so less blood means less oxygen. Without oxygen, your body tissue will start to die. °Gangrene can affect many parts of your body. Gangrene is most common on the skin (external gangrene), but it can also affect internal parts of the body (internal gangrene). °Gangrene requires emergency treatment. °What are the causes? °Any condition that cuts off blood supply can cause gangrene. Common causes include: °· Injuries. °· Blood vessel disease. °· Diabetes. °· Infections. ° °What increases the risk? °You may be at increased risk for gangrene if you have: °· A serious injury. °· Recent surgery. °· Diabetes. °· A weak body defense system (immune system). °· Narrowing of your arteries (arteriosclerosis). °· An immune system disease that causes your arteries to constrict (Raynaud disease). °· A history of IV drug use. ° °What are the signs or symptoms? °The signs and symptoms depend on what part of your body is affected. If you have external gangrene, you may have: °· Severe pain followed by loss of feeling. °· Redness and swelling. °· Crackling sounds when you press on a swollen area of skin. °· A wound with bad-smelling drainage. °· Changes in the color of your skin. It may turn red, blue, black, or white. °· Fever and chills. ° °If you have internal gangrene, you may have: °· Fever and chills. °· Confusion. °· Dizziness. °· Severe pain. °· Rapid heartbeat and breathing. °· Loss of appetite. °· Nausea or vomiting. ° °How is this diagnosed? °To diagnose gangrene, your health care provider will take your medical history and do a physical exam. Tests may also be done to help in making the diagnosis. These may include: °· X-rays of your blood vessels after injection of a dye (arteriogram). °· Blood tests to look for an infection in your blood. °· Imaging studies such as a CT scan or  MRI. °· Taking a swab from a draining wound to look for bacteria in the laboratory (culture). °· Taking a piece of tissue (biopsy) to look for cell death in the laboratory. °· A surgical procedure to look for gangrene inside your body. ° °How is this treated? °Treatment for gangrene depends on the cause and the area of your body that is affected. Gangrene is usually treated in the hospital. It is very important to start treatment early before gangrene spreads. Treatment may include the following: °· Medicine. You may get high doses of antibiotic medicine for gangrene caused by infection. The antibiotics may be given directly into a vein through an IV tube. °· Surgery. Surgical options may include: °? Debridement. This is surgery to remove dead tissue. You may need to have debridement several times. °? Bypass or angioplasty. This surgery improves the blood supply to an affected area. °? Amputation. Sometimes it is necessary to remove a body part. °· Oxygen therapy. This involves treatment in a chamber designed to provide high levels of oxygen (hyperbaric oxygen therapy). It can improve the oxygen supply to an affected area. °· Supportive care. This may include: °? IV fluids and nutrients. °? Pain medicines. °? Blood thinners to prevent blood clots. ° °Follow these instructions at home: °· Take medicines only as directed by your health care provider. °· If you were prescribed an antibiotic medicine, finish it all even if you start to feel better. °· Clean any cuts or scratches with an antiseptic. °· Cover any open wounds. °·   Check wounds for signs of infection. Watch for redness or swelling. °· Do not use any tobacco products, including cigarettes, chewing tobacco, or electronic cigarettes. If you need help quitting, ask your health care provider. °· Limit alcohol intake to no more than 1 drink per day for nonpregnant women and 2 drinks per day for men. One drink equals 12 ounces of beer, 5 ounces of wine, or 1½  ounces of hard liquor. °· Eat a healthy diet and maintain a healthy weight. °· Get some exercise on most days of the week. Ask your health care provider to suggest activities that are safe for you. °· If you have diabetes or another blood vessel disease, check your feet often for signs of injury or infection. °· After any surgery you have, follow your health care provider's instructions carefully. °· Keep all follow-up visits as directed by your health care provider. This is important. °Contact a health care provider if: °· You have a wound or sore that is not healing. °· You have color changes (white, red, blue, or black) in an area of your skin. °· You have a wound or sore with smelly drainage. °Get help right away if: °· You have rapidly worsening pain at the site of a skin infection or wound. °· You lose sensation at the site of a skin infection or wound. °· You have unexplained: °? Fever. °? Chills. °? Confusion. °? Fainting. °This information is not intended to replace advice given to you by your health care provider. Make sure you discuss any questions you have with your health care provider. °Document Released: 08/16/2004 Document Revised: 03/22/2016 Document Reviewed: 12/09/2013 °[employer] Interactive Patient Education © 2018 [employer] ° °

## 2017-09-17 NOTE — Discharge Summary (Signed)
[facility] [place] at [facility]   PATIENT NAME: Stanley Nguyen    MR#:  [id number]  DATE OF BIRTH:  Aug 22, 1946  DATE OF ADMISSION:  09/09/2017   ADMITTING PHYSICIAN: Katha Hamming, MD  DATE OF DISCHARGE: 09/17/2017  2:22 PM  PRIMARY CARE PHYSICIAN: [name], MD   ADMISSION DIAGNOSIS:  Hyponatremia [E87.1] Gangrenous toe (HCC) [I96] Cellulitis of left foot [L03.116] DISCHARGE DIAGNOSIS:  Active Problems:   Gangrene of toe of left foot (HCC)  SECONDARY DIAGNOSIS:   Past Medical History:  Diagnosis Date  . Atrial fibrillation (HCC)   . CAD (coronary artery disease)   . COPD (chronic obstructive pulmonary disease) (HCC)   . DM (diabetes mellitus) (HCC)    diet controlled  . Liver cirrhosis (HCC)   . Liver cirrhosis (HCC)   . Seizure disorder [facility])    HOSPITAL COURSE:  71 year old male with past medical history of seizure disorder, liver cirrhosis, diabetes, COPD, history of coronary artery disease, atrial fibrillation admitted to the hospital due to left foot pain and swelling.  1. Left foot/great toe gangrene -L foot gangrene mainly of great toe while also being treated for R foot osteomyelitis.  Prior cx from R foot with MSSA and Strep anginosis and he had been treated with IV abx and being D/C on augmentin. - His R foot seems to be improving but L foot developed gangrene and has had extensive revascularization. Plan if for later amputation after demarcation.  - Demarcation is slowly occurring at this time.  Will likely be limited to great toe and 2nd toe.  Surrounding skin is slightly erythematous.  No purulence. D/W pt that I still recommend waiting 1 more week to allow demarcation to occur.  Pt expressed understanding. C/W wound vac on right foot. Dry dressing on left. WBAT to left  F/u with Podiatry Dr Ether Griffins next week.  2. Essential hypertension-continue Norvasc, lisinopril.  3. History of atrial fibrillation-rate controlled.  Continue sotalol. Continue Xarelto  4. COPD-no acute exacerbation-continue Spiriva  6. History of seizures-continue Lacosamide.   7. Neuropathy-continue gabapentin.  8. Anxiety-continue BuSpar.  9.insomnia: Patient requesting Ambien 10 mg at night.  #9 .recent Right leg osteomyelitis: Augmentin per ID at D/C, wound VAC by Home Health Nurse  10. Deconditioning ;possible disposition as per PT recommendation.  DISCHARGE CONDITIONS:  stable CONSULTS OBTAINED:  Treatment Team:  Enedina Finner, MD Mick Sell, MD Schnier, Latina Craver, MD Laurier Nancy, MD DRUG ALLERGIES:   Allergies  Allergen Reactions  . Keppra [Levetiracetam] Rash   DISCHARGE MEDICATIONS:   Allergies as of 09/17/2017      Reactions   Keppra [levetiracetam] Rash      Medication List    TAKE these medications   amLODipine 10 MG tablet Commonly known as:  NORVASC Take 1 tablet (10 mg total) by mouth daily.   amoxicillin-clavulanate 875-125 MG tablet Commonly known as:  AUGMENTIN Take 1 tablet by mouth every 12 (twelve) hours for 7 days.   busPIRone 7.5 MG tablet Commonly known as:  BUSPAR Take 7.5 mg by mouth 2 (two) times daily.   gabapentin 300 MG capsule Commonly known as:  NEURONTIN Take 300 mg by mouth 3 (three) times daily.   lisinopril 10 MG tablet Commonly known as:  PRINIVIL,ZESTRIL Take 1 tablet (10 mg total) by mouth daily.   nicotine 21 mg/24hr patch Commonly known as:  NICODERM CQ - dosed in mg/24 hours Place 1 patch (21 mg total) onto the skin daily.  oxyCODONE 40 mg 12 hr tablet Commonly known as:  OXYCONTIN Take 1 tablet (40 mg total) by mouth every 8 (eight) hours. What changed:    medication strength  how much to take   polyethylene glycol packet Commonly known as:  MIRALAX / GLYCOLAX Take 17 g by mouth daily.   Potassium Chloride ER 20 MEQ Tbcr Take 1 tablet daily by mouth.   sotalol 80 MG tablet Commonly known as:  BETAPACE Take 80 mg by  mouth daily.   SPIRIVA HANDIHALER 18 MCG inhalation capsule Generic drug:  tiotropium Place 1 capsule into inhaler and inhale daily.   VIMPAT 50 MG Tabs tablet Generic drug:  lacosamide Take 50 mg by mouth 2 (two) times daily.   XARELTO 20 MG Tabs tablet Generic drug:  rivaroxaban Take 20 mg by mouth daily.   zolpidem 10 MG tablet Commonly known as:  AMBIEN Take 1 tablet (10 mg total) by mouth at bedtime as needed for sleep. What changed:    medication strength  how much to take        DISCHARGE INSTRUCTIONS:  C/W wound vac on right foot. Dry dressing on left. WBAT to left   Clean wound with NS.  Pat dry.  Apply silicone contact layer to wound bed.  Fill wound bed with drape and bridge to lower leg.  Cover with drape.  Change Monday/Wednesday/Friday.  DIET:  Regular diet DISCHARGE CONDITION:  Good ACTIVITY:  Activity as tolerated OXYGEN:  Home Oxygen: No.  Oxygen Delivery: room air DISCHARGE LOCATION:  home - Home Health, RN  If you experience worsening of your admission symptoms, develop shortness of breath, life threatening emergency, suicidal or homicidal thoughts you must seek medical attention immediately by calling 911 or calling your MD immediately  if symptoms less severe.  You Must read complete instructions/literature along with all the possible adverse reactions/side effects for all the Medicines you take and that have been prescribed to you. Take any new Medicines after you have completely understood and accpet all the possible adverse reactions/side effects.   Please note  You were cared for by a hospitalist during your hospital stay. If you have any questions about your discharge medications or the care you received while you were in the hospital after you are discharged, you can call the unit and asked to speak with the hospitalist on call if the hospitalist that took care of you is not available. Once you are discharged, your primary care physician  will handle any further medical issues. Please note that NO REFILLS for any discharge medications will be authorized once you are discharged, as it is imperative that you return to your primary care physician (or establish a relationship with a primary care physician if you do not have one) for your aftercare needs so that they can reassess your need for medications and monitor your lab values.    On the day of Discharge:  VITAL SIGNS:  Blood pressure (!) 168/66, pulse 75, temperature 98.2 F (36.8 C), temperature source Oral, resp. rate 20, height 5\' 10"  (1.778 m), weight 70.8 kg (156 lb), SpO2 98 %. PHYSICAL EXAMINATION:  GENERAL:  71 y.o.-year-old patient lying in the bed with no acute distress.  EYES: Pupils equal, round, reactive to light and accommodation. No scleral icterus. Extraocular muscles intact.  HEENT: Head atraumatic, normocephalic. Oropharynx and nasopharynx clear.  NECK:  Supple, no jugular venous distention. No thyroid enlargement, no tenderness.  LUNGS: Normal breath sounds bilaterally, no wheezing, rales,rhonchi or crepitation.  No use of accessory muscles of respiration.  CARDIOVASCULAR: S1, S2 normal. No murmurs, rubs, or gallops.  ABDOMEN: Soft, non-tender, non-distended. Bowel sounds present. No organomegaly or mass.  EXTREMITIES: No pedal edema, cyanosis, or clubbing.  NEUROLOGIC: Cranial nerves II through XII are intact. Muscle strength 5/5 in all extremities. Sensation intact. Gait not checked.  PSYCHIATRIC: The patient is alert and oriented x 3.  SKIN: Chronic nonhealing wound to right anterior foot. 3.3 cm x 2.4 cm x 0.3 cm with visible tendon in wound bed.   Right foot wound vac in place. No surrounding erythema or ulcers Left foot with cyanotic great toe with severe pain.   No purulence or lymphangitic streaking. DATA REVIEW:   CBC Recent Labs  Lab 09/17/17 0503  WBC 8.5  HGB 9.1*  HCT 26.1*  PLT 175    Chemistries  Recent Labs  Lab 09/17/17 0503    NA 130*  K 4.3  CL 98*  CO2 27  GLUCOSE 83  BUN 7  CREATININE 0.67  CALCIUM 7.7*     Follow-up Information    Annice Needyew, Jason S, MD. Go on 09/25/2017.   Specialties:  Vascular Surgery, Radiology, Interventional Cardiology Why:  at 7:30am Contact information: [address] [place] [address] [phone]        Gwyneth RevelsFowler, Justin, DPM. Go on 09/24/2017.   Specialty:  Podiatry Why:  at 2:30pm Contact information: [address] [place] [address] [phone]        Sherron Mondayejan-Sie, S Ahmed, MD. Go on 09/23/2017.   Specialty:  Internal Medicine Why:  at 2:45pm Contact information: [address] [place] [address] [phone]           Management plans discussed with the patient, family and they are in agreement.  CODE STATUS: Prior   TOTAL TIME TAKING CARE OF THIS PATIENT: 45 minutes.    Delfino LovettVipul Kathey Simer M.D on 09/17/2017 at 8:26 PM  Between 7am to 6pm - Pager - [phone]  After 6pm go to [email or web address] - Social research officer, governmentpassword [email or web address]  [facility]  Office  [phone]  CC: Primary care physician; Sherron Mondayejan-Sie, S Ahmed, MD   Note: This dictation was prepared with Dragon dictation along with smaller phrase technology. Any transcriptional errors that result from this process are unintentional.

## 2017-09-17 NOTE — Care Management (Signed)
Notified [facility] of discharge and patient requests ems tranposrt.

## 2017-09-24 ENCOUNTER — Other Ambulatory Visit: Payer: Self-pay | Admitting: Podiatry

## 2017-09-25 ENCOUNTER — Encounter (INDEPENDENT_AMBULATORY_CARE_PROVIDER_SITE_OTHER): Payer: Medicare HMO

## 2017-09-25 ENCOUNTER — Ambulatory Visit (INDEPENDENT_AMBULATORY_CARE_PROVIDER_SITE_OTHER): Payer: Medicare HMO | Admitting: Vascular Surgery

## 2017-09-25 ENCOUNTER — Other Ambulatory Visit (INDEPENDENT_AMBULATORY_CARE_PROVIDER_SITE_OTHER): Payer: Self-pay | Admitting: Vascular Surgery

## 2017-09-25 DIAGNOSIS — I739 Peripheral vascular disease, unspecified: Secondary | ICD-10-CM

## 2017-09-26 ENCOUNTER — Encounter
Admission: RE | Admit: 2017-09-26 | Discharge: 2017-09-26 | Disposition: A | Discharge status: Home / Self Care | Payer: Medicare HMO | Source: Ambulatory Visit | Attending: Podiatry | Admitting: Podiatry

## 2017-09-26 ENCOUNTER — Other Ambulatory Visit: Payer: Self-pay

## 2017-09-26 DIAGNOSIS — F17210 Nicotine dependence, cigarettes, uncomplicated: Secondary | ICD-10-CM | POA: Diagnosis not present

## 2017-09-26 DIAGNOSIS — J449 Chronic obstructive pulmonary disease, unspecified: Secondary | ICD-10-CM | POA: Diagnosis not present

## 2017-09-26 DIAGNOSIS — I96 Gangrene, not elsewhere classified: Secondary | ICD-10-CM | POA: Diagnosis present

## 2017-09-26 DIAGNOSIS — I10 Essential (primary) hypertension: Secondary | ICD-10-CM | POA: Diagnosis not present

## 2017-09-26 DIAGNOSIS — M868X7 Other osteomyelitis, ankle and foot: Secondary | ICD-10-CM | POA: Diagnosis not present

## 2017-09-26 HISTORY — DX: Essential (primary) hypertension: I10

## 2017-09-26 HISTORY — DX: Peripheral vascular disease, unspecified: I73.9

## 2017-09-26 HISTORY — DX: Prediabetes: R73.03

## 2017-09-26 LAB — HEMOGLOBIN AND HEMATOCRIT, BLOOD
HCT: 31.5 % — ABNORMAL LOW (ref 40.0–52.0)
Hemoglobin: 10.5 g/dL — ABNORMAL LOW (ref 13.0–18.0)

## 2017-09-26 LAB — SURGICAL PCR SCREEN
MRSA, PCR: NEGATIVE
Staphylococcus aureus: POSITIVE — AB

## 2017-09-26 MED ORDER — CEFAZOLIN SODIUM-DEXTROSE 2-4 GM/100ML-% IV SOLN
2.0000 g | INTRAVENOUS | Status: AC
  Administered 2017-09-27: 2 g via INTRAVENOUS

## 2017-09-26 NOTE — Pre-Procedure Instructions (Signed)
Incentive Spirometer & written instructions reviewed with& given to pt, he returned a correct demo of same.

## 2017-09-26 NOTE — Patient Instructions (Signed)
  Your procedure is scheduled NW:GNFAOZHYon:tomorrow Nov. 30 , 2018 at 9:00 am. Report to Same Day Surgery.   Remember: Instructions that are not followed completely may result in serious medical risk, up to and including death, or upon the discretion of your surgeon and anesthesiologist your surgery may need to be rescheduled.    _x___ 1. Do not eat food after midnight night prior to surgery. No gum   chewing or hard candies, snacks or breakfast.    May drink the following: water, Gatorade, clear apple juice, black coffee     or black tea up until 2 hours prior to ARRIVAL time.     ____ 2. No Alcohol for 24 hours before or after surgery.   ____ 3. Bring all medications with you on the day of surgery if instructed.    __x__ 4. Notify your doctor if there is any change in your medical condition     (cold, fever, infections).    _____ 5.   Do Not Smoke or use e-cigarettes For 24 Hours Prior to Your   Surgery.  Do not use any chewable tobacco products for at least 6   hours prior to  surgery.                      Do not wear jewelry, make-up, hairpins, clips or nail polish.  Do not wear lotions, powders, or perfumes.   Do not shave 48 hours prior to surgery. Men may shave face and neck.  Do not bring valuables to the hospital.    [facility] is not responsible for any belongings or valuables.               Contacts, dentures or bridgework may not be worn into surgery.  Leave your suitcase in the car. After surgery it may be brought to your room.  For patients admitted to the hospital, discharge time is determined by your  treatment team.   Patients discharged the day of surgery will not be allowed to drive home.    Please read over the following fact sheets that you were given:   [facility] Preparing for Surgery  __x__ Take these medicines the morning of surgery with A SIP OF WATER:    1. amLODipine (NORVASC)  2. busPIRone (BUSPAR)   3. gabapentin (NEURONTIN)   4. oxyCODONE (OXYCONTIN)  optional  5. sotalol (BETAPACE)  6. VIMPAT     ____ Fleet Enema (as directed)   _x__ Use SAGE wipes as directed on instruction sheet  _x___ Use inhalers on the day of surgery and bring to hospital day of surgery  ____ Stop metformin 2 days prior to surgery    ____ Take 1/2 of usual insulin dose the night before surgery and none on the morning of          surgery.   ____ Stop Eliquis/Coumadin/Plavix/aspirin on does not applhy.  __x__ Stop Anti-inflammatories such as Advil, Aleve, Ibuprofen, Motrin, Naproxen,  Naprosyn, Goodies powders or aspirin products. OK to take Tylenol.   ____ Stop supplements until after surgery.    ____ Bring C-Pap to the hospital.

## 2017-09-27 ENCOUNTER — Encounter
Admission: RE | Disposition: A | Discharge status: Home / Self Care | Payer: Self-pay | Source: Ambulatory Visit | Attending: Podiatry

## 2017-09-27 ENCOUNTER — Ambulatory Visit: Payer: Medicare HMO | Admitting: Registered Nurse

## 2017-09-27 ENCOUNTER — Ambulatory Visit
Admission: RE | Admit: 2017-09-27 | Discharge: 2017-09-27 | Disposition: A | Discharge status: Home / Self Care | Payer: Medicare HMO | Source: Ambulatory Visit | Attending: Podiatry | Admitting: Podiatry

## 2017-09-27 ENCOUNTER — Encounter: Payer: Self-pay | Admitting: *Deleted

## 2017-09-27 DIAGNOSIS — I96 Gangrene, not elsewhere classified: Secondary | ICD-10-CM

## 2017-09-27 DIAGNOSIS — J449 Chronic obstructive pulmonary disease, unspecified: Secondary | ICD-10-CM | POA: Insufficient documentation

## 2017-09-27 DIAGNOSIS — I10 Essential (primary) hypertension: Secondary | ICD-10-CM | POA: Insufficient documentation

## 2017-09-27 DIAGNOSIS — M868X7 Other osteomyelitis, ankle and foot: Secondary | ICD-10-CM | POA: Insufficient documentation

## 2017-09-27 DIAGNOSIS — F17210 Nicotine dependence, cigarettes, uncomplicated: Secondary | ICD-10-CM | POA: Insufficient documentation

## 2017-09-27 HISTORY — DX: Osteomyelitis, unspecified: M86.9

## 2017-09-27 HISTORY — DX: Cardiac arrhythmia, unspecified: I49.9

## 2017-09-27 HISTORY — PX: AMPUTATION TOE: SHX6595

## 2017-09-27 HISTORY — DX: Gangrene, not elsewhere classified: I96

## 2017-09-27 LAB — GLUCOSE, CAPILLARY
Glucose-Capillary: 80 mg/dL (ref 65–99)
Glucose-Capillary: 77 mg/dL (ref 65–99)

## 2017-09-27 SURGERY — AMPUTATION, TOE
Anesthesia: General | Site: Foot | Laterality: Left | Wound class: Dirty or Infected

## 2017-09-27 MED ORDER — OXYCODONE-ACETAMINOPHEN 7.5-325 MG PO TABS: ORAL_TABLET | ORAL | 0 refills | Status: DC

## 2017-09-27 MED ORDER — DEXAMETHASONE SODIUM PHOSPHATE 10 MG/ML IJ SOLN
INTRAMUSCULAR | Status: DC | PRN
  Administered 2017-09-27: 5 mg via INTRAVENOUS

## 2017-09-27 MED ORDER — BUPIVACAINE HCL 0.5 % IJ SOLN: INTRAMUSCULAR | Status: DC | PRN

## 2017-09-27 MED ORDER — POVIDONE-IODINE 7.5 % EX SOLN
Freq: Once | CUTANEOUS | Status: DC
  Filled 2017-09-27: qty 118

## 2017-09-27 MED ORDER — BUPIVACAINE HCL (PF) 0.25 % IJ SOLN
INTRAMUSCULAR | Status: AC
  Filled 2017-09-27: qty 30

## 2017-09-27 MED ORDER — BUPIVACAINE LIPOSOME 1.3 % IJ SUSP
INTRAMUSCULAR | Status: AC
  Filled 2017-09-27: qty 20

## 2017-09-27 MED ORDER — ONDANSETRON HCL 4 MG/2ML IJ SOLN
INTRAMUSCULAR | Status: DC | PRN
  Administered 2017-09-27: 4 mg via INTRAVENOUS

## 2017-09-27 MED ORDER — CEFAZOLIN SODIUM-DEXTROSE 2-4 GM/100ML-% IV SOLN
INTRAVENOUS | Status: AC
  Filled 2017-09-27: qty 100

## 2017-09-27 MED ORDER — MIDAZOLAM HCL 2 MG/2ML IJ SOLN: INTRAMUSCULAR | Status: DC | PRN

## 2017-09-27 MED ORDER — LIDOCAINE HCL (CARDIAC) 20 MG/ML IV SOLN
INTRAVENOUS | Status: DC | PRN
  Administered 2017-09-27: 60 mg via INTRAVENOUS

## 2017-09-27 MED ORDER — BUPIVACAINE LIPOSOME 1.3 % IJ SUSP
INTRAMUSCULAR | Status: DC | PRN
  Administered 2017-09-27: 20 mL

## 2017-09-27 MED ORDER — FAMOTIDINE 20 MG PO TABS
ORAL_TABLET | ORAL | Status: AC
  Filled 2017-09-27: qty 1

## 2017-09-27 MED ORDER — PHENYLEPHRINE HCL 10 MG/ML IJ SOLN
INTRAMUSCULAR | Status: DC | PRN
  Administered 2017-09-27 (×2): 100 ug via INTRAVENOUS

## 2017-09-27 MED ORDER — FAMOTIDINE 20 MG PO TABS: 20.0000 mg | ORAL_TABLET | Freq: Once | ORAL | Status: DC

## 2017-09-27 MED ORDER — LIDOCAINE HCL (PF) 2 % IJ SOLN
INTRAMUSCULAR | Status: AC
  Filled 2017-09-27: qty 10

## 2017-09-27 MED ORDER — PROPOFOL 10 MG/ML IV BOLUS
INTRAVENOUS | Status: DC | PRN
  Administered 2017-09-27: 100 mg via INTRAVENOUS
  Administered 2017-09-27: 20 mg via INTRAVENOUS

## 2017-09-27 MED ORDER — BUPIVACAINE HCL (PF) 0.25 % IJ SOLN
INTRAMUSCULAR | Status: DC | PRN
  Administered 2017-09-27: 20 mL

## 2017-09-27 MED ORDER — FENTANYL CITRATE (PF) 100 MCG/2ML IJ SOLN
INTRAMUSCULAR | Status: DC | PRN
  Administered 2017-09-27: 25 ug via INTRAVENOUS

## 2017-09-27 MED ORDER — EPHEDRINE SULFATE 50 MG/ML IJ SOLN
INTRAMUSCULAR | Status: DC | PRN
  Administered 2017-09-27 (×2): 5 mg via INTRAVENOUS

## 2017-09-27 MED ORDER — FENTANYL CITRATE (PF) 100 MCG/2ML IJ SOLN
INTRAMUSCULAR | Status: AC
  Filled 2017-09-27: qty 2

## 2017-09-27 MED ORDER — SODIUM CHLORIDE 0.9 % IV SOLN
INTRAVENOUS | Status: DC
  Administered 2017-09-27: 10:00:00 via INTRAVENOUS

## 2017-09-27 SURGICAL SUPPLY — 44 items
BANDAGE ELASTIC 4 LF NS (GAUZE/BANDAGES/DRESSINGS) ×2 IMPLANT
BANDAGE STRETCH 3X4.1 STRL (GAUZE/BANDAGES/DRESSINGS) ×4 IMPLANT
BLADE MED AGGRESSIVE (BLADE) ×2 IMPLANT
BLADE OSC/SAGITTAL MD 5.5X18 (BLADE) IMPLANT
BLADE SURG MINI STRL (BLADE) ×2 IMPLANT
BNDG ESMARK 4X12 TAN STRL LF (GAUZE/BANDAGES/DRESSINGS) ×2 IMPLANT
BNDG GAUZE 4.5X4.1 6PLY STRL (MISCELLANEOUS) ×2 IMPLANT
CANISTER SUCT 1200ML W/VALVE (MISCELLANEOUS) ×2 IMPLANT
DRAPE FLUOR MINI C-ARM 54X84 (DRAPES) IMPLANT
DRAPE XRAY CASSETTE 23X24 (DRAPES) IMPLANT
DURAPREP 26ML APPLICATOR (WOUND CARE) ×2 IMPLANT
ELECT REM PT RETURN 9FT ADLT (ELECTROSURGICAL) ×2
ELECTRODE REM PT RTRN 9FT ADLT (ELECTROSURGICAL) ×1 IMPLANT
GAUZE PACKING IODOFORM 1/2 (PACKING) ×2 IMPLANT
GAUZE PETRO XEROFOAM 1X8 (MISCELLANEOUS) ×2 IMPLANT
GAUZE SPONGE 4X4 12PLY STRL (GAUZE/BANDAGES/DRESSINGS) ×2 IMPLANT
GAUZE STRETCH 2X75IN STRL (MISCELLANEOUS) ×2 IMPLANT
GLOVE BIO SURGEON STRL SZ7.5 (GLOVE) ×2 IMPLANT
GLOVE INDICATOR 8.0 STRL GRN (GLOVE) ×2 IMPLANT
GOWN STRL REUS W/ TWL LRG LVL3 (GOWN DISPOSABLE) ×2 IMPLANT
GOWN STRL REUS W/TWL LRG LVL3 (GOWN DISPOSABLE) ×2
KIT RM TURNOVER STRD PROC AR (KITS) ×2 IMPLANT
LABEL OR SOLS (LABEL) IMPLANT
NDL SAFETY ECLIPSE 18X1.5 (NEEDLE) ×1 IMPLANT
NEEDLE FILTER BLUNT 18X 1/2SAF (NEEDLE) ×1
NEEDLE FILTER BLUNT 18X1 1/2 (NEEDLE) ×1 IMPLANT
NEEDLE HYPO 18GX1.5 SHARP (NEEDLE) ×1
NEEDLE HYPO 25X1 1.5 SAFETY (NEEDLE) ×2 IMPLANT
NS IRRIG 500ML POUR BTL (IV SOLUTION) ×2 IMPLANT
PACK EXTREMITY ARMC (MISCELLANEOUS) ×2 IMPLANT
PAD ABD DERMACEA PRESS 5X9 (GAUZE/BANDAGES/DRESSINGS) ×4 IMPLANT
PULSAVAC PLUS IRRIG FAN TIP (DISPOSABLE)
SHIELD FULL FACE ANTIFOG 7M (MISCELLANEOUS) ×2 IMPLANT
SOL .9 NS 3000ML IRR  AL (IV SOLUTION)
SOL .9 NS 3000ML IRR UROMATIC (IV SOLUTION) IMPLANT
SPONGE XRAY 4X4 16PLY STRL (MISCELLANEOUS) ×2 IMPLANT
STOCKINETTE M/LG 89821 (MISCELLANEOUS) ×2 IMPLANT
STRAP SAFETY BODY (MISCELLANEOUS) ×2 IMPLANT
SUT ETHILON 3-0 FS-10 30 BLK (SUTURE) ×4
SUT ETHILON 5-0 FS-2 18 BLK (SUTURE) ×2 IMPLANT
SUT VIC AB 4-0 FS2 27 (SUTURE) ×2 IMPLANT
SUTURE EHLN 3-0 FS-10 30 BLK (SUTURE) ×2 IMPLANT
SYR 10ML LL (SYRINGE) ×2 IMPLANT
TIP FAN IRRIG PULSAVAC PLUS (DISPOSABLE) IMPLANT

## 2017-09-27 NOTE — Op Note (Signed)
Operative note   Surgeon:Austin Pongratz Armed forces logistics/support/administrative officerowler    Assistant: None    Preop diagnosis:1. Left great toe gangrene 2.  Left second toe gangrene    Postop diagnosis: Same    Procedure: 1.  Left great toe amputation with removal of the metatarsal head 2.  Left second toe amputation metatarsophalangeal joint    EBL: 10 mL's    Anesthesia:local and general    Hemostasis: None    Specimen: Gangrene left great toe and second time    Complications: None    Operative indications:Stanley Nguyen is an 71 y.o. that presents today for surgical intervention.  The risks/benefits/alternatives/complications have been discussed and consent has been given.    Procedure:  Patient was brought into the OR and placed on the operating table in thesupine position. After anesthesia was obtained theleft lower extremity was prepped and draped in usual sterile fashion.  Local anesthetic consisted of 20 mL's of 0.25% bupivacaine plain and 20 mL's of Exparel long-acting Marcaine.  After sterile prep and drape attention was directed to the distal left foot where the gangrenous changes were noted.  The area of demarcation was mapped out.  Full-thickness incision was taken circumferential around the great toe at the level of the metatarsophalangeal joint and the second toe just distal to the metatarsophalangeal joint.  The toes were then disarticulated at the MTPJ's and removed from the surgical field in toto.  At this time good bleeding was noted to all residual skin.  The first metatarsal head was still exposed and was unable to close the wound.  At this time I decided to remove the distal portion of the first metatarsal head and the sesamoids.  With a power saw was able to remove the distal metatarsal head.  Wound was flushed with copious amounts of irrigation.  I was unable to perform a closure of the wound at this time.  All bleeders were Bovie cauterized prior to closure.  Closure was performed with a 3-0 nylon.  A bulky sterile  dressing was applied to the left foot.    Patient tolerated the procedure and anesthesia well.  Was transported from the OR to the PACU with all vital signs stable and vascular status intact. To be discharged per routine protocol.  Will follow up in approximately 1 week in the outpatient clinic.

## 2017-09-27 NOTE — Anesthesia Procedure Notes (Signed)
Procedure Name: LMA Insertion Date/Time: 09/27/2017 10:35 AM Performed by: Karoline CaldwellStarr, Mckennah Kretchmer, CRNA Pre-anesthesia Checklist: Patient identified, Patient being monitored, Timeout performed, Emergency Drugs available and Suction available Patient Re-evaluated:Patient Re-evaluated prior to induction Oxygen Delivery Method: Circle system utilized Preoxygenation: Pre-oxygenation with 100% oxygen Induction Type: IV induction Ventilation: Mask ventilation without difficulty LMA: LMA inserted LMA Size: 4.0 Tube type: Oral Number of attempts: 1 Placement Confirmation: positive ETCO2 and breath sounds checked- equal and bilateral Tube secured with: Tape Dental Injury: Teeth and Oropharynx as per pre-operative assessment

## 2017-09-27 NOTE — Anesthesia Postprocedure Evaluation (Signed)
Anesthesia Post Note  Patient: Stanley Nguyen  Procedure(s) Performed: AMPUTATION TOE-LEFT GREAT TOE, LEFT SECOND TOE (Left Foot)  Patient location during evaluation: PACU Anesthesia Type: General Level of consciousness: awake and alert and oriented Pain management: pain level controlled Vital Signs Assessment: post-procedure vital signs reviewed and stable Respiratory status: spontaneous breathing, nonlabored ventilation and respiratory function stable Cardiovascular status: blood pressure returned to baseline and stable Postop Assessment: no signs of nausea or vomiting Anesthetic complications: no     Last Vitals:  Vitals:   09/27/17 1228 09/27/17 1238  BP: (!) 144/67 (!) 145/91  Pulse: (!) 56 61  Resp: (!) 9 18  Temp:  36.5 C  SpO2: 99% 100%    Last Pain:  Vitals:   09/27/17 1158  TempSrc:   PainSc: Asleep                 Taos Tapp

## 2017-09-27 NOTE — OR Nursing (Signed)
Patient removed wound vac from right foot this morning.  Arrived here with the foot wrapped in gauze. Denies any drainage and states that it was closed.  Dr. Ether GriffinsFowler aware and was okay with this. Directed surgeon to visualize prior results from September from the wound. Patient was positive for Staph Aureus at that time. Dr. Ether GriffinsFowler saw what antibiotics were sensitive to this and felt okay to continue with Ancef.  Dr.Kephart in to speak with patient and requested a current EKG be performed today since a history of afib is listed in chart. Patient has had several vascular surgeries in last few months with stents implanted and confirmation of rhythm was needed.  Dr. Henrene HawkingKephart aware of ekg results from this am.

## 2017-09-27 NOTE — Progress Notes (Signed)
Dr. Ether GriffinsFowler notified that dressing was starting to saturate with blood. Dr. Ether GriffinsFowler came and requested the dressing be rewrapped. This RN used 4x4, ABD, and Kerlix to redress the wound. Cincere Zorn E 12:46 PM 09/27/2017

## 2017-09-27 NOTE — H&P (Signed)
HISTORY AND PHYSICAL INTERVAL NOTE:  09/27/2017  9:55 AM  Quentin CornwallBruce J Ugarte  has presented today for surgery, with the diagnosis of N/A.  The various methods of treatment have been discussed with the patient.  No guarantees were given.  After consideration of risks, benefits and other options for treatment, the patient has consented to surgery.  I have reviewed the patients' chart and labs.    Patient Vitals for the past 24 hrs:  BP Temp Temp src Pulse Resp SpO2 Height Weight  09/27/17 0935 (!) 137/59 (!) 97 F (36.1 C) Tympanic 60 15 100 % 5\' 10"  (1.778 m) 70.8 kg (156 lb)    A history and physical examination was performed in my office.  The patient was reexamined.  There have been no changes to this history and physical examination.  Gwyneth RevelsFowler, Sascha Baugher A

## 2017-09-27 NOTE — Anesthesia Preprocedure Evaluation (Signed)
Anesthesia Evaluation  Patient identified by MRN, date of birth, ID band Patient awake    Reviewed: Allergy & Precautions, NPO status , Patient's Chart, lab work & pertinent test results  History of Anesthesia Complications Negative for: history of anesthetic complications  Airway Mallampati: II       Dental  (+) Upper Dentures, Lower Dentures   Pulmonary neg sleep apnea, COPD,  COPD inhaler, Current Smoker,           Cardiovascular hypertension, Pt. on medications + CAD and + Peripheral Vascular Disease  + dysrhythmias Atrial Fibrillation      Neuro/Psych Seizures -, Well Controlled,     GI/Hepatic Neg liver ROS, neg GERD  ,  Endo/Other  neg diabetes  Renal/GU negative Renal ROS     Musculoskeletal   Abdominal   Peds  Hematology   Anesthesia Other Findings   Reproductive/Obstetrics                             Anesthesia Physical Anesthesia Plan  ASA: III  Anesthesia Plan: General   Post-op Pain Management:    Induction: Intravenous  PONV Risk Score and Plan: 1 and Midazolam, Treatment may vary due to age or medical condition and Ondansetron  Airway Management Planned: LMA  Additional Equipment:   Intra-op Plan:   Post-operative Plan:   Informed Consent: I have reviewed the patients History and Physical, chart, labs and discussed the procedure including the risks, benefits and alternatives for the proposed anesthesia with the patient or authorized representative who has indicated his/her understanding and acceptance.     Plan Discussed with:   Anesthesia Plan Comments:         Anesthesia Quick Evaluation

## 2017-09-27 NOTE — Discharge Instructions (Signed)
[facility] °[facility] ° °POST OPERATIVE INSTRUCTIONS FOR DR. TROXLER AND DR. FOWLER °[facility] ° ° °1. Take your medication as prescribed.  Pain medication should be taken only as needed. ° °2. Keep the dressing clean, dry and intact. ° °3. Keep your foot elevated above the heart level for the first 48 hours. ° °4. Walking to the bathroom and brief periods of walking are acceptable, unless we have instructed you to be non-weight bearing. ° °5. Always wear your post-op shoe when walking.  Always use your crutches if you are to be non-weight bearing. ° °6. Do not take a shower. Baths are permissible as long as the foot is kept out of the water.  ° °7. Every hour you are awake:  °- Bend your knee 15 times. °- Flex foot 15 times °- Massage calf 15 times ° °8. Call [facility] ([phone]) if any of the following problems occur: °- You develop a temperature or fever. °- The bandage becomes saturated with blood. °- Medication does not stop your pain. °- Injury of the foot occurs. °- Any symptoms of infection including redness, odor, or red streaks running from wound. ° ° ° ° °AMBULATORY SURGERY  °DISCHARGE INSTRUCTIONS ° ° °1) The drugs that you were given will stay in your system until tomorrow so for the next 24 hours you should not: ° °A) Drive an automobile °B) Make any legal decisions °C) Drink any alcoholic beverage ° ° °2) You may resume regular meals tomorrow.  Today it is better to start with liquids and gradually work up to solid foods. ° °You may eat anything you prefer, but it is better to start with liquids, then soup and crackers, and gradually work up to solid foods. ° ° °3) Please notify your doctor immediately if you have any unusual bleeding, trouble breathing, redness and pain at the surgery site, drainage, fever, or pain not relieved by medication. ° ° ° °4) Additional Instructions: ° ° ° ° ° ° ° °Please contact your physician with any  problems or Same Day Surgery at [phone], Monday through Friday 6 am to 4 pm, or [place] [facility] number at [phone]. ° °

## 2017-09-27 NOTE — Progress Notes (Signed)
Dressing dry on discharge. Extra dressing materials sent home with pt if needed

## 2017-09-27 NOTE — Anesthesia Post-op Follow-up Note (Signed)
Anesthesia QCDR form completed.        

## 2017-09-27 NOTE — Transfer of Care (Signed)
Immediate Anesthesia Transfer of Care Note  Patient: Stanley Nguyen  Procedure(s) Performed: AMPUTATION TOE-LEFT GREAT TOE, LEFT SECOND TOE (Left Foot)  Patient Location: PACU  Anesthesia Type:General  Level of Consciousness: awake, alert  and oriented  Airway & Oxygen Therapy: Patient Spontanous Breathing and Patient connected to face mask oxygen  Post-op Assessment: Report given to RN and Post -op Vital signs reviewed and stable  Post vital signs: Reviewed and stable  Last Vitals:  Vitals:   09/27/17 0935 09/27/17 1143  BP: (!) 137/59 135/65  Pulse: 60 (!) 55  Resp: 15 10  Temp: (!) 36.1 C (!) 36.4 C  SpO2: 100% 100%    Last Pain:  Vitals:   09/27/17 0935  TempSrc: Tympanic  PainSc: 7       Patients Stated Pain Goal: 1 (09/27/17 0935)  Complications: No apparent anesthesia complications

## 2017-10-02 LAB — SURGICAL PATHOLOGY

## 2019-12-18 ENCOUNTER — Other Ambulatory Visit: Payer: Self-pay

## 2019-12-18 ENCOUNTER — Emergency Department: Payer: Medicare Other

## 2019-12-18 ENCOUNTER — Inpatient Hospital Stay
Admission: EM | Admit: 2019-12-18 | Discharge: 2019-12-19 | DRG: 917 | Disposition: A | Discharge status: Home Health | Payer: Medicare Other | Attending: Hospitalist | Admitting: Hospitalist

## 2019-12-18 ENCOUNTER — Encounter: Payer: Self-pay | Admitting: Emergency Medicine

## 2019-12-18 DIAGNOSIS — R4182 Altered mental status, unspecified: Secondary | ICD-10-CM | POA: Diagnosis present

## 2019-12-18 DIAGNOSIS — T50904A Poisoning by unspecified drugs, medicaments and biological substances, undetermined, initial encounter: Secondary | ICD-10-CM

## 2019-12-18 DIAGNOSIS — I10 Essential (primary) hypertension: Secondary | ICD-10-CM | POA: Diagnosis not present

## 2019-12-18 DIAGNOSIS — Z888 Allergy status to other drugs, medicaments and biological substances status: Secondary | ICD-10-CM | POA: Diagnosis not present

## 2019-12-18 DIAGNOSIS — Z87891 Personal history of nicotine dependence: Secondary | ICD-10-CM | POA: Diagnosis not present

## 2019-12-18 DIAGNOSIS — K7460 Unspecified cirrhosis of liver: Secondary | ICD-10-CM | POA: Diagnosis not present

## 2019-12-18 DIAGNOSIS — G92 Toxic encephalopathy: Secondary | ICD-10-CM | POA: Diagnosis not present

## 2019-12-18 DIAGNOSIS — J189 Pneumonia, unspecified organism: Secondary | ICD-10-CM | POA: Insufficient documentation

## 2019-12-18 DIAGNOSIS — R410 Disorientation, unspecified: Secondary | ICD-10-CM

## 2019-12-18 DIAGNOSIS — I2510 Atherosclerotic heart disease of native coronary artery without angina pectoris: Secondary | ICD-10-CM | POA: Diagnosis not present

## 2019-12-18 DIAGNOSIS — Z6822 Body mass index (BMI) 22.0-22.9, adult: Secondary | ICD-10-CM

## 2019-12-18 DIAGNOSIS — G9340 Encephalopathy, unspecified: Secondary | ICD-10-CM | POA: Diagnosis not present

## 2019-12-18 DIAGNOSIS — J449 Chronic obstructive pulmonary disease, unspecified: Secondary | ICD-10-CM | POA: Diagnosis present

## 2019-12-18 DIAGNOSIS — H9190 Unspecified hearing loss, unspecified ear: Secondary | ICD-10-CM | POA: Diagnosis present

## 2019-12-18 DIAGNOSIS — G40909 Epilepsy, unspecified, not intractable, without status epilepticus: Secondary | ICD-10-CM | POA: Diagnosis present

## 2019-12-18 DIAGNOSIS — Z79899 Other long term (current) drug therapy: Secondary | ICD-10-CM

## 2019-12-18 DIAGNOSIS — R627 Adult failure to thrive: Secondary | ICD-10-CM | POA: Diagnosis present

## 2019-12-18 DIAGNOSIS — I739 Peripheral vascular disease, unspecified: Secondary | ICD-10-CM | POA: Diagnosis present

## 2019-12-18 DIAGNOSIS — T402X1A Poisoning by other opioids, accidental (unintentional), initial encounter: Secondary | ICD-10-CM | POA: Diagnosis not present

## 2019-12-18 DIAGNOSIS — R918 Other nonspecific abnormal finding of lung field: Secondary | ICD-10-CM | POA: Diagnosis not present

## 2019-12-18 DIAGNOSIS — Z89422 Acquired absence of other left toe(s): Secondary | ICD-10-CM | POA: Diagnosis not present

## 2019-12-18 DIAGNOSIS — I482 Chronic atrial fibrillation, unspecified: Secondary | ICD-10-CM | POA: Diagnosis present

## 2019-12-18 DIAGNOSIS — Z7901 Long term (current) use of anticoagulants: Secondary | ICD-10-CM

## 2019-12-18 DIAGNOSIS — T426X1A Poisoning by other antiepileptic and sedative-hypnotic drugs, accidental (unintentional), initial encounter: Principal | ICD-10-CM | POA: Diagnosis present

## 2019-12-18 DIAGNOSIS — Z20822 Contact with and (suspected) exposure to covid-19: Secondary | ICD-10-CM | POA: Diagnosis present

## 2019-12-18 DIAGNOSIS — T50901A Poisoning by unspecified drugs, medicaments and biological substances, accidental (unintentional), initial encounter: Secondary | ICD-10-CM | POA: Insufficient documentation

## 2019-12-18 DIAGNOSIS — G894 Chronic pain syndrome: Secondary | ICD-10-CM | POA: Diagnosis present

## 2019-12-18 DIAGNOSIS — G629 Polyneuropathy, unspecified: Secondary | ICD-10-CM | POA: Diagnosis not present

## 2019-12-18 LAB — COMPREHENSIVE METABOLIC PANEL
ALT: 9 U/L (ref 0–44)
AST: 19 U/L (ref 15–41)
Albumin: 2.5 g/dL — ABNORMAL LOW (ref 3.5–5.0)
Alkaline Phosphatase: 65 U/L (ref 38–126)
Anion gap: 9 (ref 5–15)
BUN: 16 mg/dL (ref 8–23)
CO2: 28 mmol/L (ref 22–32)
Calcium: 8.8 mg/dL — ABNORMAL LOW (ref 8.9–10.3)
Chloride: 98 mmol/L (ref 98–111)
Creatinine, Ser: 1.23 mg/dL (ref 0.61–1.24)
GFR calc Af Amer: >60 mL/min (ref >60)
GFR calc non Af Amer: 58 mL/min — ABNORMAL LOW (ref >60)
Glucose, Bld: 104 mg/dL — ABNORMAL HIGH (ref 70–99)
Potassium: 3.6 mmol/L (ref 3.5–5.1)
Sodium: 135 mmol/L (ref 135–145)
Total Bilirubin: 0.9 mg/dL (ref 0.3–1.2)
Total Protein: 7.3 g/dL (ref 6.5–8.1)

## 2019-12-18 LAB — CBC
HCT: 32 % — ABNORMAL LOW (ref 39.0–52.0)
Hemoglobin: 10.8 g/dL — ABNORMAL LOW (ref 13.0–17.0)
MCH: 31.3 pg (ref 26.0–34.0)
MCHC: 33.8 g/dL (ref 30.0–36.0)
MCV: 92.8 fL (ref 80.0–100.0)
Platelets: 239 10*3/uL (ref 150–400)
RBC: 3.45 MIL/uL — ABNORMAL LOW (ref 4.22–5.81)
RDW: 13.7 % (ref 11.5–15.5)
WBC: 12.8 10*3/uL — ABNORMAL HIGH (ref 4.0–10.5)
nRBC: 0 % (ref 0.0–0.2)

## 2019-12-18 LAB — ACETAMINOPHEN LEVEL: Acetaminophen (Tylenol), Serum: <10 ug/mL — ABNORMAL LOW (ref 10–30)

## 2019-12-18 LAB — CK: Total CK: 16 U/L — ABNORMAL LOW (ref 49–397)

## 2019-12-18 LAB — SALICYLATE LEVEL: Salicylate Lvl: <7 mg/dL — ABNORMAL LOW (ref 7.0–30.0)

## 2019-12-18 LAB — SARS CORONAVIRUS 2 (TAT 6-24 HRS): SARS Coronavirus 2: NEGATIVE

## 2019-12-18 MED ORDER — SENNA 8.6 MG PO TABS
1.0000 | ORAL_TABLET | Freq: Two times a day (BID) | ORAL | Status: DC
  Administered 2019-12-18 – 2019-12-19 (×2): 8.6 mg via ORAL
  Filled 2019-12-18 (×2): qty 1

## 2019-12-18 MED ORDER — PIPERACILLIN-TAZOBACTAM 3.375 G IVPB 30 MIN
3.3750 g | Freq: Once | INTRAVENOUS | Status: AC
  Administered 2019-12-18: 3.375 g via INTRAVENOUS
  Filled 2019-12-18: qty 50

## 2019-12-18 MED ORDER — SODIUM CHLORIDE 0.9 % IV BOLUS
500.0000 mL | Freq: Once | INTRAVENOUS | Status: AC
  Administered 2019-12-18: 14:00:00 500 mL via INTRAVENOUS

## 2019-12-18 MED ORDER — ACETAMINOPHEN 650 MG RE SUPP: 650.0000 mg | Freq: Four times a day (QID) | RECTAL | Status: DC | PRN

## 2019-12-18 MED ORDER — ACETAMINOPHEN 325 MG PO TABS
650.0000 mg | ORAL_TABLET | Freq: Four times a day (QID) | ORAL | Status: DC | PRN
  Administered 2019-12-19: 650 mg via ORAL
  Filled 2019-12-18: qty 2

## 2019-12-18 MED ORDER — SODIUM CHLORIDE 0.9 % IV SOLN: INTRAVENOUS | Status: DC

## 2019-12-18 MED ORDER — HYDRALAZINE HCL 20 MG/ML IJ SOLN
10.0000 mg | Freq: Four times a day (QID) | INTRAMUSCULAR | Status: DC | PRN
  Administered 2019-12-18: 10 mg via INTRAVENOUS
  Filled 2019-12-18: qty 1
  Filled 2019-12-18: qty 0.5

## 2019-12-18 MED ORDER — RIVAROXABAN 20 MG PO TABS
20.0000 mg | ORAL_TABLET | Freq: Every evening | ORAL | Status: DC
  Administered 2019-12-18: 20 mg via ORAL
  Filled 2019-12-18 (×2): qty 1

## 2019-12-18 MED ORDER — AMLODIPINE BESYLATE 5 MG PO TABS
5.0000 mg | ORAL_TABLET | Freq: Every day | ORAL | Status: DC
  Administered 2019-12-18 – 2019-12-19 (×2): 5 mg via ORAL
  Filled 2019-12-18 (×2): qty 1

## 2019-12-18 MED ORDER — LACOSAMIDE 50 MG PO TABS
50.0000 mg | ORAL_TABLET | Freq: Two times a day (BID) | ORAL | Status: DC
  Administered 2019-12-18 – 2019-12-19 (×2): 50 mg via ORAL
  Filled 2019-12-18 (×2): qty 1

## 2019-12-18 MED ORDER — SOTALOL HCL 80 MG PO TABS
80.0000 mg | ORAL_TABLET | Freq: Every day | ORAL | Status: DC
  Administered 2019-12-19: 80 mg via ORAL
  Filled 2019-12-18 (×2): qty 1

## 2019-12-18 MED ORDER — SODIUM CHLORIDE 0.9 % IV SOLN
200.0000 mg | Freq: Once | INTRAVENOUS | Status: DC
  Filled 2019-12-18: qty 20

## 2019-12-18 MED ORDER — TIOTROPIUM BROMIDE MONOHYDRATE 18 MCG IN CAPS
18.0000 ug | ORAL_CAPSULE | Freq: Every day | RESPIRATORY_TRACT | Status: DC
  Administered 2019-12-18 – 2019-12-19 (×2): 18 ug via RESPIRATORY_TRACT
  Filled 2019-12-18: qty 5

## 2019-12-18 MED ORDER — BISACODYL 10 MG RE SUPP
10.0000 mg | Freq: Every day | RECTAL | Status: DC | PRN
  Filled 2019-12-18: qty 1

## 2019-12-18 MED ORDER — NITROGLYCERIN 0.4 MG SL SUBL: 0.4000 mg | SUBLINGUAL_TABLET | SUBLINGUAL | Status: DC | PRN

## 2019-12-18 MED ORDER — SODIUM CHLORIDE 0.9 % IV SOLN
3.0000 g | Freq: Three times a day (TID) | INTRAVENOUS | Status: DC
  Administered 2019-12-18 – 2019-12-19 (×2): 3 g via INTRAVENOUS
  Filled 2019-12-18: qty 8
  Filled 2019-12-18 (×2): qty 3
  Filled 2019-12-18: qty 8

## 2019-12-18 NOTE — Consult Note (Signed)
PHARMACY -  BRIEF ANTIBIOTIC NOTE   Pharmacy has received consult(s) for pip/tazo (aspiration pneumonia) from an ED provider.  The patient's profile has been reviewed for ht/wt/allergies/indication/available labs. No antibiotic drug allergies.   One time order(s) placed for: -pip/tazo 3.375 g over 30 minutes  Further antibiotics/pharmacy consults should be ordered by admitting physician if indicated.                       Thank you, Tressie Ellis  Pharmacy Resident 12/18/2019  2:39 PM

## 2019-12-18 NOTE — ED Provider Notes (Signed)
[facility] called; requested info and will reach back out to the hospital to see if [facility] involvment needs to be reengaged.    Sharyn Creamer, MD 12/18/19 1455

## 2019-12-18 NOTE — Plan of Care (Signed)
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
## 2019-12-18 NOTE — Progress Notes (Signed)
Report given to [facility] on 1A. Patient in NAD at this time. Patient to be transported to room.

## 2019-12-18 NOTE — TOC Initial Note (Signed)
Transition of Care [facility]) - Initial/Assessment Note    Patient Details  Name: Stanley Nguyen MRN: [id number] Date of Birth: 05-Nov-1945  [facility] CM/SW Contact:    Broomfield Cellar, RN Phone Number: 12/18/2019, 4:12 PM  Clinical Narrative:                 Spoke with patient and daughter-Stanley Nguyen. Daughter states patients wife is coming home from [employer] tomorrow however family is concerned because patient has gotten very weak and requiring more assistance than wife can provide. Patient was confused to the year but oriented otherwise. Daughter states they have been working with [place] Nguyen DSS to seek placement or other resources to assist family. Family is in process of completing Medicaid application for ALF or LTC placement.   Expected Discharge Plan: Skilled Nursing Facility     Patient Goals and CMS Choice Patient states their goals for this hospitalization and ongoing recovery are:: get placed into SNF or ALF      Expected Discharge Plan and Services Expected Discharge Plan: Skilled Nursing Facility       Living arrangements for the past 2 months: Single Family Home                                      Prior Living Arrangements/Services Living arrangements for the past 2 months: Single Family Home Lives with:: Spouse Patient language and need for interpreter reviewed:: Yes Do you feel safe going back to the place where you live?: No   seeking placement  Need for Family Participation in Patient Care: Yes (Comment) Care giver support system in place?: Yes (comment) Current home services: DME(walker) Criminal Activity/Legal Involvement Pertinent to Current Situation/Hospitalization: No - Comment as needed  Activities of Daily Living      Permission Sought/Granted Permission sought to share information with : Facility Medical sales representative, Other (comment)(Active with Stanley Nguyen-DSS APS) Permission granted to share information with : Yes,  Verbal Permission Granted              Emotional Assessment Appearance:: Appears older than stated age, Disheveled Attitude/Demeanor/Rapport: Inconsistent Affect (typically observed): Accepting Orientation: : Oriented to Self, Oriented to Situation Alcohol / Substance Use: Tobacco Use(Previous Alcoholic-Sober 10 years ago) Psych Involvement: No (comment)  Admission diagnosis:  Overdose Patient Active Problem List   Diagnosis Date Noted  . Gangrene of toe of left foot ([facility]) 09/09/2017  . Tobacco use disorder 07/26/2017  . Peripheral artery disease ([facility]) 07/26/2017  . Acute osteomyelitis of metatarsal bone of right foot ([facility]) 07/10/2017   PCP:  Stanley Monday, MD Pharmacy:   [facility] [phone] - [place], [place] - [address] [place] [address] Phone: [phone] Fax: [phone]     Social Determinants of Health (SDOH) Interventions    Readmission Risk Interventions No flowsheet data found.

## 2019-12-18 NOTE — Consult Note (Signed)
Patient is a 74 year old male with no apparent psychiatric history who presents following an Ambien overdose.  From chart review daughter is reporting that patient took approximately 20 Ambien over the course of 2 days.  Are made to see the patient however he remained very somnolent and additionally is hard of hearing making interview during this time difficult.  It was able to be ascertained from patient that he did deny any thoughts or intentions of self-harm.  He was explained that he may have taken too much of his medication which he reports is a possibility.  No psychiatric involvement at this time, we will wait for the medication effects to wear off before further evaluating the patient if necessary.

## 2019-12-18 NOTE — ED Triage Notes (Signed)
Pt ems from home for altered mental status and possible Ambien overdose. Per daughter pt has taken approx 20 ambiens over 2 days.

## 2019-12-18 NOTE — ED Provider Notes (Signed)
[facility] Emergency Department Provider Note   ____________________________________________   First MD Initiated Contact with Patient 12/18/19 1247     (approximate)  I have reviewed the triage vital signs and the nursing notes.   HISTORY  Chief Complaint Drug Overdose  EM caveat: Altered mental status confusion  HPI Stanley Nguyen is a 74 y.o. male   history of atrial fibrillation coronary disease COPD previous gangrene  Patient presents for evaluation of possible overdose.  Discussed with the patient's daughter here for evaluation as she reports they found him confused, weak and fatigued today at his home.  They also noted that over the last 2 days he has consumed about 20 Ambien tablets.  Believe that this was of unknown intent.  Additionally, he has recently been living at a hotel for a night with his daughter because he did not have power in his home, they returned him to his home and had to leave his medication with him which she normally does not administer to himself but they noted that he had gone through about 20 Ambien tablets in a day.  Today they found him confused and weak  Patient himself cannot provide any history, he denies being in pain, he is not able to provide sustained history  Daughter does report that he had a fall couple days ago while he was staying at the hotel with her but did not obviously suffer any significant injury.  Past Medical History:  Diagnosis Date  . Atrial fibrillation (HCC)   . CAD (coronary artery disease)   . COPD (chronic obstructive pulmonary disease) (HCC)   . Dysrhythmia    af. patient unaware of a diagnosis of a fib  . Gangrene (HCC) 09/27/2017   left foot/toes  . Hypertension   . Liver cirrhosis (HCC)   . Liver cirrhosis (HCC)   . Osteomyelitis (HCC) 2018   right ankle and foot  . Peripheral vascular disease (HCC)   . Pre-diabetes   . Seizure disorder (HCC) 2015   last seizure 2-3 years ago. takes  vimpat    Patient Active Problem List   Diagnosis Date Noted  . Gangrene of toe of left foot (HCC) 09/09/2017  . Tobacco use disorder 07/26/2017  . Peripheral artery disease (HCC) 07/26/2017  . Acute osteomyelitis of metatarsal bone of right foot (HCC) 07/10/2017    Past Surgical History:  Procedure Laterality Date  . AMPUTATION TOE Left 09/27/2017   Procedure: AMPUTATION TOE-LEFT GREAT TOE, LEFT SECOND TOE;  Surgeon: Gwyneth Revels, DPM;  Location: [facility] ORS;  Service: Podiatry;  Laterality: Left;  . CARDIAC CATHETERIZATION     no stents  . ENDARTERECTOMY FEMORAL Right 07/17/2017   Procedure: ENDARTERECTOMY FEMORAL;  Surgeon: Annice Needy, MD;  Location: [facility] ORS;  Service: Vascular;  Laterality: Right;  . HERNIA REPAIR  1998   umbilical  . IRRIGATION AND DEBRIDEMENT FOOT Right 07/12/2017   Procedure: IRRIGATION AND DEBRIDEMENT FOOT;  Surgeon: Gwyneth Revels, DPM;  Location: [facility] ORS;  Service: Podiatry;  Laterality: Right;  . LOWER EXTREMITY ANGIOGRAM Right 07/17/2017   Procedure: LOWER EXTREMITY ANGIOGRAM ( FSA STENT PLACEMENT );  Surgeon: Annice Needy, MD;  Location: [facility] ORS;  Service: Vascular;  Laterality: Right;  . LOWER EXTREMITY ANGIOGRAPHY Right 07/15/2017   Procedure: Lower Extremity Angiography;  Surgeon: Annice Needy, MD;  Location: [facility] INVASIVE CV LAB;  Service: Cardiovascular;  Laterality: Right;  . LOWER EXTREMITY ANGIOGRAPHY Left 09/11/2017   Procedure: Lower Extremity Angiography;  Surgeon: Algernon Huxley, MD;  Location: [employer] CV LAB;  Service: Cardiovascular;  Laterality: Left;  . LOWER EXTREMITY ANGIOGRAPHY Left 09/13/2017   Procedure: Lower Extremity Angiography;  Surgeon: Algernon Huxley, MD;  Location: [place] CV LAB;  Service: Cardiovascular;  Laterality: Left;  . LOWER EXTREMITY INTERVENTION  09/11/2017   Procedure: LOWER EXTREMITY INTERVENTION;  Surgeon: Algernon Huxley, MD;  Location: [place] CV LAB;  Service: Cardiovascular;;    Prior to  Admission medications   Medication Sig Start Date End Date Taking? Authorizing Provider  busPIRone (BUSPAR) 7.5 MG tablet Take 7.5 mg by mouth daily.  07/09/17   [provider]  gabapentin (NEURONTIN) 300 MG capsule Take 300 mg by mouth 3 (three) times daily. 07/09/17   [provider]  lisinopril (PRINIVIL,ZESTRIL) 10 MG tablet Take 1 tablet (10 mg total) by mouth daily. 07/19/17   Bettey Costa, MD  Multiple Vitamin (MULTIVITAMIN WITH MINERALS) TABS tablet Take 1 tablet by mouth daily.    [provider]  nitroGLYCERIN (NITROSTAT) 0.4 MG SL tablet Place 0.4 mg under the tongue every 5 (five) minutes as needed for chest pain.    [provider]  oxyCODONE-acetaminophen (PERCOCET) 7.5-325 MG tablet Take 1 tablet by mouth every 6 (six) hours as needed for moderate pain or severe pain.    [provider]  oxyCODONE-acetaminophen (PERCOCET) 7.5-325 MG tablet 1-2 tabs q 6 hours prn pain 09/27/17   Samara Deist, DPM  Potassium Chloride ER 20 MEQ TBCR Take 1 tablet daily by mouth.    [provider]  sotalol (BETAPACE) 80 MG tablet Take 80 mg by mouth daily. 07/02/17   [provider]  SPIRIVA HANDIHALER 18 MCG inhalation capsule Place 1 capsule into inhaler and inhale daily. 07/03/17   [provider]  VIMPAT 50 MG TABS tablet Take 50 mg by mouth 2 (two) times daily. 07/02/17   [provider]  XARELTO 20 MG TABS tablet Take 20 mg by mouth daily. 06/24/17   [provider]  zolpidem (AMBIEN) 10 MG tablet Take 1 tablet (10 mg total) by mouth at bedtime as needed for sleep. Patient taking differently: Take 10 mg by mouth at bedtime.  09/17/17   Max Sane, MD    Allergies Keppra [levetiracetam]  History reviewed. No pertinent family history.  Social History Social History   Tobacco Use  . Smoking status: Former Smoker    Packs/day: 1.00    Types: Cigarettes    Quit date: 09/04/2017    Years since quitting: 2.2    . Smokeless tobacco: Never Used  . Tobacco comment: still smokes but less  Substance Use Topics  . Alcohol use: No    Comment: previously a drinker. stopped 6 years ago.  . Drug use: No    Review of Systems  Caveat   ____________________________________________   PHYSICAL EXAM:  VITAL SIGNS: ED Triage Vitals  Enc Vitals Group     BP 12/18/19 1335 (!) 168/56     Pulse Rate 12/18/19 1335 69     Resp 12/18/19 1357 14     Temp 12/18/19 1335 97.6 F (36.4 C)     Temp Source 12/18/19 1335 Oral     SpO2 12/18/19 1335 97 %     Weight 12/18/19 1341 160 lb (72.6 kg)     Height --      Head Circumference --      Peak Flow --      Pain Score --  Pain Loc --      Pain Edu? --      Excl. in GC? --     Constitutional: Alert and fatigued in appearance, oriented to self but not to year or location. Eyes: Conjunctivae are normal. Head: Atraumatic. Nose: No congestion/rhinnorhea. Mouth/Throat: Mucous membranes are somewhat dry. Neck: No stridor.  Cardiovascular: Normal rate, regular rhythm. Grossly normal heart sounds.  Good peripheral circulation. Respiratory: Normal respiratory effort.  No retractions. Lungs CTAB. Gastrointestinal: Soft and nontender. No distention. Musculoskeletal: No lower extremity tenderness nor edema.  Moves all of his extremities equally.  There are no focal deficits. Neurologic: Mostly 1-2 words that are clear, very fatigued and disoriented.  No obvious facial asymmetry or droop. Skin:  Skin is warm, dry and intact. No rash noted. Psychiatric: Mood and affect are calm, slightly somnolent.  ____________________________________________   LABS (all labs ordered are listed, but only abnormal results are displayed)  Labs Reviewed  CBC - Abnormal; Notable for the following components:      Result Value   WBC 12.8 (*)    RBC 3.45 (*)    Hemoglobin 10.8 (*)    HCT 32.0 (*)    All other components within normal limits  COMPREHENSIVE METABOLIC PANEL  - Abnormal; Notable for the following components:   Glucose, Bld 104 (*)    Calcium 8.8 (*)    Albumin 2.5 (*)    GFR calc non Af Amer 58 (*)    All other components within normal limits  ACETAMINOPHEN LEVEL - Abnormal; Notable for the following components:   Acetaminophen (Tylenol), Serum <10 (*)    All other components within normal limits  SALICYLATE LEVEL - Abnormal; Notable for the following components:   Salicylate Lvl <7.0 (*)    All other components within normal limits  CK - Abnormal; Notable for the following components:   Total CK 16 (*)    All other components within normal limits  SARS CORONAVIRUS 2 (TAT 6-24 HRS)  CULTURE, BLOOD (ROUTINE X 2)  CULTURE, BLOOD (ROUTINE X 2)  URINE DRUG SCREEN, QUALITATIVE ([facility] ONLY)  URINALYSIS, COMPLETE (UACMP) WITH MICROSCOPIC  CBG MONITORING, ED   ____________________________________________  EKG  ED ECG REPORT I, Sharyn Creamer, the attending physician, personally viewed and interpreted this ECG.  Date: 12/18/2019 EKG Time: 1310 Rate: 70 Rhythm: normal sinus rhythm QRS Axis: normal Intervals: normal ST/T Wave abnormalities: normal Narrative Interpretation: no evidence of acute ischemia  ____________________________________________  RADIOLOGY  DG Chest 1 View  Result Date: 12/18/2019 CLINICAL DATA:  Altered mental status, confusion. Additional history provided by technologist: Altered mental status, history of atrial fibrillation, CAD, COPD EXAM: CHEST  1 VIEW COMPARISON:  Chest radiograph 07/11/2017 FINDINGS: Heart size within normal limits. Aortic atherosclerosis. Ill-defined opacity at the right lung base suspicious for pneumonia. Small right pleural effusion. The left lung is clear. No evidence of pneumothorax. Extensive vascular calcifications. No acute bony abnormality. IMPRESSION: Ill-defined opacity at the right lung base suspicious for pneumonia. Correlate for aspiration. Radiographic follow-up to resolution is  recommended. A small right pleural effusion is also present. The left lung is clear. Aortic atherosclerosis and extensive vascular calcifications. Electronically Signed   By: Jackey Loge DO   On: 12/18/2019 13:32   CT Head Wo Contrast  Result Date: 12/18/2019 CLINICAL DATA:  Altered mental status EXAM: CT HEAD WITHOUT CONTRAST TECHNIQUE: Contiguous axial images were obtained from the base of the skull through the vertex without intravenous contrast. COMPARISON:  CT head 07/29/2013 FINDINGS:  Brain: Moderate atrophy, with progression. Negative for hydrocephalus. Negative for acute infarct, hemorrhage, mass. Vascular: Atherosclerotic calcification in the carotid and vertebral arteries bilaterally. Negative for hyperdense vessel Skull: Negative Sinuses/Orbits: Negative Other: None IMPRESSION: Moderate atrophy.  No acute abnormality Electronically Signed   By: Marlan Palau M.D.   On: 12/18/2019 13:23    Imaging reviewed CT of the head negative for acute.  Chest x-ray concerning for right lung base opacity. ____________________________________________   PROCEDURES  Procedure(s) performed: None  Procedures  Critical Care performed: No  ____________________________________________   INITIAL IMPRESSION / ASSESSMENT AND PLAN / ED COURSE  Pertinent labs & imaging results that were available during my care of the patient were reviewed by me and considered in my medical decision making (see chart for details).   Patient comes for evaluation of altered mental status confusion, and certainly zolpidem overuse or overdose could contribute to these symptoms, concerning that his chest x-ray shows an infiltrate and his white count slightly elevated.  I would question if he could have possibly aspirated.  Will cover appropriately.  No known Covid exposure, will test for.  Clinical Course as of Dec 17 1445  Fri Dec 18, 2019  1431 Lawana Pai (daughter) at [phone]   [MQ]    Clinical Course User  Index [MQ] Sharyn Creamer, MD   EKG reassuring.  Hemodynamics are relatively reassuring as well, etiology of altered mental status cannot be clearly identified at this point though suspect to be related to Ambien and possible aspiration.  Does appear slightly dry, will hydrate, observe closely.  Plan to admit to the hospital discussed with Dr. Allena Katz for further work-up and care.  Patient's daughter in agreement.  ____________________________________________   FINAL CLINICAL IMPRESSION(S) / ED DIAGNOSES  Final diagnoses:  Pneumonia of right lower lobe due to infectious organism  Drug overdose, undetermined intent, initial encounter  Delirium        Note:  This document was prepared using Dragon voice recognition software and may include unintentional dictation errors       Sharyn Creamer, MD 12/18/19 1448

## 2019-12-18 NOTE — Progress Notes (Signed)
CODE SEPSIS - PHARMACY COMMUNICATION  **Broad Spectrum Antibiotics should be administered within 1 hour of Sepsis diagnosis**  Time Code Sepsis Called/Page Received: 1432  Antibiotics Ordered: pip/tazo  Time of 1st antibiotic administration: 1529  Additional action taken by pharmacy: Spoke with RN at [phone]. Blood cultures being drawn at that time.   Tressie Ellis Pharmacy Resident 12/18/2019  2:45 PM

## 2019-12-18 NOTE — Progress Notes (Signed)
Received patient fro ED vis stretcher.  Completed VS, skin check with Net LPN and candace RN Notified MD by sure chat patient arrived to unit room 144

## 2019-12-19 DIAGNOSIS — G9340 Encephalopathy, unspecified: Secondary | ICD-10-CM

## 2019-12-19 DIAGNOSIS — R4182 Altered mental status, unspecified: Secondary | ICD-10-CM | POA: Diagnosis not present

## 2019-12-19 DIAGNOSIS — T426X1A Poisoning by other antiepileptic and sedative-hypnotic drugs, accidental (unintentional), initial encounter: Secondary | ICD-10-CM | POA: Diagnosis not present

## 2019-12-19 LAB — BASIC METABOLIC PANEL
Anion gap: 10 (ref 5–15)
BUN: 15 mg/dL (ref 8–23)
CO2: 24 mmol/L (ref 22–32)
Calcium: 8.3 mg/dL — ABNORMAL LOW (ref 8.9–10.3)
Chloride: 103 mmol/L (ref 98–111)
Creatinine, Ser: 1.19 mg/dL (ref 0.61–1.24)
GFR calc Af Amer: >60 mL/min (ref >60)
GFR calc non Af Amer: >60 mL/min (ref >60)
Glucose, Bld: 106 mg/dL — ABNORMAL HIGH (ref 70–99)
Potassium: 3.1 mmol/L — ABNORMAL LOW (ref 3.5–5.1)
Sodium: 137 mmol/L (ref 135–145)

## 2019-12-19 LAB — PROCALCITONIN: Procalcitonin: <0.1 ng/mL

## 2019-12-19 MED ORDER — OXYCONTIN 30 MG PO T12A: EXTENDED_RELEASE_TABLET | ORAL | Status: AC

## 2019-12-19 NOTE — Plan of Care (Signed)
Pt came in somnolent after having overdosed on his home oxycontin and Ambien.    This morning, pt woke up, alert, oriented, back to baseline, and wanting to go home.  Pt has no other medical needs.  Pt is discharged, however, got call from daughter and wife that they didn't want pt to go home.  I talked to the wife on the phone, who was concerned about pt "hollering for his pain meds" because he is not due for pain clinic appointment until 12/22/19.  I explained to wife pt has no medical needs to stay in the hospital, and we can't keep someone in the hospital just to give them their home narcotics because they had taken too many.  PT rec home with HHPT, so pt currently doesn't qualify for SNF rehab.  Pt has the right to go back to his own home.  Wife then said, if he dies, I am laying it on you, and then she hung up on me.  Will arrange for taxi ride for pt to go home.

## 2019-12-19 NOTE — Progress Notes (Signed)
Pt's is for DC today. Social worker was notified about the family situation. Home health was arranged by Child psychotherapist as per recommendation of PT.

## 2019-12-19 NOTE — Evaluation (Signed)
Physical Therapy Evaluation Patient Details Name: Stanley Nguyen MRN: [id number] DOB: 02-Nov-1945 Today's Date: 12/19/2019   History of Present Illness  74 yo Male admitted on 12/18/19 secondary to acute encephalopthy from unintentional ambien overdose; PMH significant: afib, CAD, COPD, chronic pain syndrome, PVD, liver cirrhosis, seizure disorder, right lower lobe pneumonia secondary to possible aspiration;  Clinical Impression  74 yo Male admitted with acute encephalopthy secondary to unintentional ambien overdose, reports that his daughter doesn't know where all his medication is located. He claims that his daughter was concerned when she saw that his medication was missing but that his wife has put it in other places. Patient lives at home with his wife, however his wife has recently been receiving inpatient rehab at [facility]. She is supposed to be discharged today. Patient reports his daughter recently moved in with them to help out. He states that she is not there all the time as he is working during the day. Patient reports feeling increased fatigue this AM and states, "I just don't think that I can get up and move around. I really need to sleep." However with encouragement, he was agreeable to get up and walk around in his room. He does exhibit increased weakness in BLE grossly 3 to 3+/5 throughout. He was able to stand with supervision using RW and was able to walk approximately 5 feet with RW, min A. Patient does exhibit a heavy forward flexed posture and some unsteadiness upon standing. He would benefit from skilled PT intervention to improve strength, balance and mobility; He reports using wheelchair for most mobility at home at baseline. He would benefit from home health PT upon discharge to assess safety in the home and to improve LE strength and mobility; Patient agreeable.     Follow Up Recommendations Home health PT    Equipment Recommendations  None recommended by PT    Recommendations  for Other Services       Precautions / Restrictions Precautions Precautions: Fall Restrictions Weight Bearing Restrictions: No      Mobility  Bed Mobility Overal bed mobility: Modified Independent             General bed mobility comments: requires increased time  Transfers Overall transfer level: Needs assistance Equipment used: Rolling walker (2 wheeled) Transfers: Sit to/from Stand Sit to Stand: Min guard;Supervision         General transfer comment: transfers with RW, initially required CGA and progressed to supervision x2 reps;  Ambulation/Gait Ambulation/Gait assistance: Min guard Gait Distance (Feet): 5 Feet Assistive device: Rolling walker (2 wheeled)   Gait velocity: decreased   General Gait Details: ambulates with forward flexed posture, short step length, decreased ankle DF at heel strike with shuffled steps, narrow base of support  Stairs            Wheelchair Mobility    Modified Rankin (Stroke Patients Only)       Balance Overall balance assessment: Needs assistance Sitting-balance support: No upper extremity supported;Feet supported Sitting balance-Leahy Scale: Fair Sitting balance - Comments: exhibits posterior loss of balance with LE movement but otherwise is WFL Postural control: Posterior lean Standing balance support: Bilateral upper extremity supported Standing balance-Leahy Scale: Poor Standing balance comment: requires CGA to min A in standing for safety; heavy flexed posture;                             Pertinent Vitals/Pain Pain Assessment: No/denies pain  Home Living Family/patient expects to be discharged to:: Private residence Living Arrangements: Spouse/significant other;Children(pt states daughter just moved in) Available Help at Discharge: Family;Available PRN/intermittently;Other (Comment) Type of Home: House Home Access: Stairs to enter;Ramped entrance   Entrance Stairs-Number of Steps: unsure;  but states he has a ramp in the back that he uses; Home Layout: One level Home Equipment: Walker - 2 wheels;Walker - 4 wheels;Cane - single point;Bedside commode;Wheelchair - manual Additional Comments: pt reports spending most of the time in his bed and will use the wheelchair for mobility in the home;    Prior Function Level of Independence: Needs assistance   Gait / Transfers Assistance Needed: uses wheelchair for mobility in the home  ADL's / Homemaking Assistance Needed: wife assists with cooking/cleaning; She is supposed to be discharged from [facility] rehab today unsure of her current functional mobility; He states his daughter recently moved in to help out but she is working during the day;        Higher education careers adviser   Dominant Hand: Right    Extremity/Trunk Assessment   Upper Extremity Assessment Upper Extremity Assessment: Overall WFL for tasks assessed    Lower Extremity Assessment Lower Extremity Assessment: Generalized weakness;RLE deficits/detail;LLE deficits/detail RLE Deficits / Details: hip grossly 3/5, knee 3+/5, ankle 3/5 LLE Deficits / Details: hip grossly 3/5, knee 3+/5, ankle 3/5    Cervical / Trunk Assessment Cervical / Trunk Assessment: Kyphotic  Communication   Communication: No difficulties  Cognition Arousal/Alertness: Awake/alert Behavior During Therapy: WFL for tasks assessed/performed Overall Cognitive Status: Within Functional Limits for tasks assessed                                 General Comments: oriented to place, date, situation;      General Comments General comments (skin integrity, edema, etc.): intact skin grossly; no swelling observed;    Exercises     Assessment/Plan    PT Assessment Patient needs continued PT services  PT Problem List Decreased strength;Decreased mobility;Decreased safety awareness;Decreased activity tolerance;Decreased balance       PT Treatment Interventions DME instruction;Therapeutic  exercise;Gait training;Balance training;Neuromuscular re-education;Functional mobility training;Therapeutic activities;Patient/family education    PT Goals (Current goals can be found in the Care Plan section)  Acute Rehab PT Goals Patient Stated Goal: to go home PT Goal Formulation: With patient Time For Goal Achievement: 01/02/20 Potential to Achieve Goals: Good    Frequency Min 2X/week   Barriers to discharge Decreased caregiver support wife is recently being discharged home from [facility]- unsure of her functional level; daughter has moved in to assist but is not available during the day as she is working, per patient report.    Co-evaluation               AM-PAC PT "6 Clicks" Mobility  Outcome Measure Help needed turning from your back to your side while in a flat bed without using bedrails?: None Help needed moving from lying on your back to sitting on the side of a flat bed without using bedrails?: None Help needed moving to and from a bed to a chair (including a wheelchair)?: A Little Help needed standing up from a chair using your arms (e.g., wheelchair or bedside chair)?: A Little Help needed to walk in hospital room?: A Little Help needed climbing 3-5 steps with a railing? : A Lot 6 Click Score: 19    End of Session Equipment Utilized During Treatment:  Gait belt Activity Tolerance: Patient limited by fatigue;No increased pain Patient left: in bed;with call bell/phone within reach;with bed alarm set Nurse Communication: Mobility status PT Visit Diagnosis: Unsteadiness on feet (R26.81);Muscle weakness (generalized) (M62.81)    Time: 4287-6811 PT Time Calculation (min) (ACUTE ONLY): 31 min   Charges:   PT Evaluation $PT Eval Low Complexity: 1 Low            Calianna Kim PT, DPT 12/19/2019, 9:41 AM

## 2019-12-19 NOTE — Progress Notes (Signed)
Pt is A&O now and is asking to go home today due to his wife coming home from a rehab??. Pt is upset and tearful about him being there when his wife gets home. Will pass to day shift.

## 2019-12-19 NOTE — TOC Progression Note (Signed)
Transition of Care [facility]) - Progression Note    Patient Details  Name: Stanley Nguyen MRN: [id number] Date of Birth: [birth year]  Transition of Care [facility]) CM/SW Contact  Maud Deed, LCSW Phone Number: 12/19/2019, 2:50 PM  Clinical Narrative:    CSW contacted Rosey Bath at [facility] to setup [facility] and she stated that Pt will not be seen until Monday, pt stated that was fine.    Expected Discharge Plan: Home w Home Health Services Barriers to Discharge: No Barriers Identified  Expected Discharge Plan and Services Expected Discharge Plan: Home w Home Health Services     Post Acute Care Choice: Home Health Living arrangements for the past 2 months: Single Family Home Expected Discharge Date: 12/19/19                         HH Arranged: PT, OT, Social Work [employer] Agency: [facility] [employer] Date HH Agency Contacted: 12/19/19 Time HH Agency Contacted: 1439 Representative spoke with at [facility] Agency: Mellissa Kohut   Social Determinants of Health (SDOH) Interventions    Readmission Risk Interventions No flowsheet data found.

## 2019-12-19 NOTE — TOC Transition Note (Signed)
Transition of Care [facility]) - CM/SW Discharge Note   Patient Details  Name: Stanley Nguyen MRN: [id number] Date of Birth: 03-12-1946  Transition of Care [facility]) CM/SW Contact:  Maud Deed, LCSW Phone Number: 12/19/2019, 2:52 PM   Clinical Narrative:    Pt medically stable for discharge and has been setup with [facility] for [facility]. Pt wife notified of discharge.    Final next level of care: Home w Home Health Services Barriers to Discharge: No Barriers Identified   Patient Goals and CMS Choice Patient states their goals for this hospitalization and ongoing recovery are:: to get stronger CMS Medicare.gov Compare Post Acute Care list provided to:: Patient Choice offered to / list presented to : Patient  Discharge Placement                Patient to be transferred to facility by: Family Friend Name of family member notified: Valeria Batman Patient and family notified of of transfer: 12/19/19  Discharge Plan and Services     Post Acute Care Choice: Home Health                    HH Arranged: PT, OT, Social Work [employer] Agency: [facility] [employer] Date HH Agency Contacted: 12/19/19 Time HH Agency Contacted: 1439 Representative spoke with at [facility] Agency: Mellissa Kohut  Social Determinants of Health (SDOH) Interventions     Readmission Risk Interventions No flowsheet data found.

## 2019-12-19 NOTE — Discharge Summary (Signed)
Physician Discharge Summary   Stanley Nguyen  male DOB: 09-16-1946  LFY:[id number]  PCP: Stanley Marble, MD  Admit date: 12/18/2019 Discharge date: 12/19/2019  Admitted From: home Disposition:  Home After pt was discharged, we received calls from daughter and wife that they didn't want pt to go home.  I talked to the wife on the phone, who was concerned about pt "hollering for his pain meds" because he is not due for pain clinic appointment until 12/22/19.  I explained to wife pt has no medical needs to stay in the hospital, and we can't keep someone in the hospital just to give them their home narcotics because they had taken too many.  PT rec home with HHPT, so pt currently doesn't qualify for SNF rehab.  Pt has the right to go back to his own home.  Wife then said, if he dies, I am laying it on you, and then she hung up on me.  Home Health: Yes Nguyen STATUS: Full Nguyen  Discharge Instructions    Diet - low sodium heart healthy   Complete by: As directed    Discharge instructions   Complete by: As directed    You have had an unintentional overdose event.  You were on narcotics pain medications and Ambien, and overdose can be very dangerous.  Please follow up with your outpatient doctor to taper down these medications.   Dr. Enzo Nguyen - -   Increase activity slowly   Complete by: As directed        Hospital Course:  For full details, please see H&P, progress notes, consult notes and ancillary notes.  Briefly,  Stanley Nguyen  is a 74 y.o. male with a known history of atrial fibrillation on Xarelto, CAD, COPD, chronic pain syndrome on chronic opioids, peripheral vascular disease, history of liver cirrhosis, seizure disorder who presented to the emergency room with possible overdose.   Dr. Jacqualine Nguyen discussed with patient's daughter who reported they found him confused, weak and fatigued at his home. They also noted that over the last two days he had consumed about 20 pills of Ambien.  It seemed unintentional.  Later, wife reported pt also took extra doses of his opioids pain meds.  Patient had a caregiver who had not been coming for few days who used to manage his pain meds.  # Acute metabolic encephalopathy 2/2 unintentional overdose of Ambien and Oxycontin Family reported pt took about 20 pills of Ambien and extra opioids pain meds (oxycontin 30 mg).  CT head shows moderate atrophy but no acute finding.  Pt received IV hydration.  Psych was consulted on admission, but couldn't do a complete evaluation at the time due to pt's somnolence, however, was able to ascertain from the pt that pt didn't have any thoughts or intentions of self-harm.  Pt woke up and was back to his mental baseline the next morning.  PT evaluated and recommended HHPT.  Pt expressed his wish to go home, and was without any further need of medical attention, so pt was discharge home.  # Chronic pain on chronic opioids # Inappropriate overuse of home opioids pain meds Pt has Rx for Oxycontin 30 mg.  Wife reported pt had taken too many and ran out before his appointment for refill scheduled for 12/22/19.  Request by wife to keep pt inpatient until his appointment for pain meds was denied since pt had no medical needs to stay inpatient.  Pt was cautioned about the danger of  taking too many Oxycontin and Ambien at the same time.  # Right lower lobe aspiration pneumonia, ruled out CXR on presentation showed "Ill-defined opacity at the right lung base" so pt was started on IV unasyn for presumed aspiration PNA.  Pt was never hypoxic, and had no fever, dyspnea, cough, or leukocytosis.  Procal was neg.  Abx was therefore d/c'ed.  # Generalized weakness, deconditioning  PT evaluated and recommended HHPT.   # Chronic atrial fibrillation on Xalreto Continued home sotalol and Xarelto  # History of seizure disorder Continued Vimpat  # Hypertension Continued amlodipine  # History of neuropathy On  gabapentin   Discharge Diagnoses:  Active Problems:   Acute encephalopathy    Discharge Instructions:  Allergies as of 12/19/2019      Reactions   Keppra [levetiracetam] Rash      Medication List    STOP taking these medications   zolpidem 10 MG tablet Commonly known as: AMBIEN     TAKE these medications   amLODipine 5 MG tablet Commonly known as: NORVASC Take 5 mg by mouth daily.   gabapentin 300 MG capsule Commonly known as: NEURONTIN Take 300 mg by mouth 3 (three) times daily.   nitroGLYCERIN 0.4 MG SL tablet Commonly known as: NITROSTAT Place 0.4 mg under the tongue every 5 (five) minutes as needed for chest pain.   OxyCONTIN 30 MG 12 hr tablet Generic drug: oxyCODONE Hold until you follow up with your outpatient provider, since you have had an unintentional overdose event. What changed:   how much to take  how to take this  when to take this  additional instructions   sotalol 80 MG tablet Commonly known as: BETAPACE Take 80 mg by mouth daily.   Spiriva HandiHaler 18 MCG inhalation capsule Generic drug: tiotropium Place 18 mcg into inhaler and inhale daily.   Vimpat 50 MG Tabs tablet Generic drug: lacosamide Take 50 mg by mouth 2 (two) times daily.   Xarelto 20 MG Tabs tablet Generic drug: rivaroxaban Take 20 mg by mouth every evening.       Follow-up Information    Stanley Monday, MD. Schedule an appointment as soon as possible for a visit in 1 week(s).   Specialty: Internal Medicine Contact information: [address] [place] [address] [phone]           Allergies  Allergen Reactions  . Keppra [Levetiracetam] Rash     The results of significant diagnostics from this hospitalization (including imaging, microbiology, ancillary and laboratory) are listed below for reference.   Consultations:   Procedures/Studies: DG Chest 1 View  Result Date: 12/18/2019 CLINICAL DATA:  Altered mental status, confusion.  Additional history provided by technologist: Altered mental status, history of atrial fibrillation, CAD, COPD EXAM: CHEST  1 VIEW COMPARISON:  Chest radiograph 07/11/2017 FINDINGS: Heart size within normal limits. Aortic atherosclerosis. Ill-defined opacity at the right lung base suspicious for pneumonia. Small right pleural effusion. The left lung is clear. No evidence of pneumothorax. Extensive vascular calcifications. No acute bony abnormality. IMPRESSION: Ill-defined opacity at the right lung base suspicious for pneumonia. Correlate for aspiration. Radiographic follow-up to resolution is recommended. A small right pleural effusion is also present. The left lung is clear. Aortic atherosclerosis and extensive vascular calcifications. Electronically Signed   By: Jackey Loge DO   On: 12/18/2019 13:32   CT Head Wo Contrast  Result Date: 12/18/2019 CLINICAL DATA:  Altered mental status EXAM: CT HEAD WITHOUT CONTRAST TECHNIQUE: Contiguous axial images were obtained  from the base of the skull through the vertex without intravenous contrast. COMPARISON:  CT head 07/29/2013 FINDINGS: Brain: Moderate atrophy, with progression. Negative for hydrocephalus. Negative for acute infarct, hemorrhage, mass. Vascular: Atherosclerotic calcification in the carotid and vertebral arteries bilaterally. Negative for hyperdense vessel Skull: Negative Sinuses/Orbits: Negative Other: None IMPRESSION: Moderate atrophy.  No acute abnormality Electronically Signed   By: Marlan Palau M.D.   On: 12/18/2019 13:23      Labs: BNP (last 3 results) No results for input(s): BNP in the last 8760 hours. Basic Metabolic Panel: Recent Labs  Lab 12/18/19 1320 12/19/19 0533  NA 135 137  K 3.6 3.1*  CL 98 103  CO2 28 24  GLUCOSE 104* 106*  BUN 16 15  CREATININE 1.23 1.19  CALCIUM 8.8* 8.3*   Liver Function Tests: Recent Labs  Lab 12/18/19 1320  AST 19  ALT 9  ALKPHOS 65  BILITOT 0.9  PROT 7.3  ALBUMIN 2.5*   No results  for input(s): LIPASE, AMYLASE in the last 168 hours. No results for input(s): AMMONIA in the last 168 hours. CBC: Recent Labs  Lab 12/18/19 1320  WBC 12.8*  HGB 10.8*  HCT 32.0*  MCV 92.8  PLT 239   Cardiac Enzymes: Recent Labs  Lab 12/18/19 1320  CKTOTAL 16*   BNP: Invalid input(s): POCBNP CBG: No results for input(s): GLUCAP in the last 168 hours. D-Dimer No results for input(s): DDIMER in the last 72 hours. Hgb A1c No results for input(s): HGBA1C in the last 72 hours. Lipid Profile No results for input(s): CHOL, HDL, LDLCALC, TRIG, CHOLHDL, LDLDIRECT in the last 72 hours. Thyroid function studies No results for input(s): TSH, T4TOTAL, T3FREE, THYROIDAB in the last 72 hours.  Invalid input(s): FREET3 Anemia work up No results for input(s): VITAMINB12, FOLATE, FERRITIN, TIBC, IRON, RETICCTPCT in the last 72 hours. Urinalysis    Component Value Date/Time   COLORURINE YELLOW (A) 09/09/2017 2100   APPEARANCEUR CLEAR (A) 09/09/2017 2100   APPEARANCEUR Clear 07/30/2013 1053   LABSPEC 1.005 09/09/2017 2100   LABSPEC 1.011 07/30/2013 1053   PHURINE 6.0 09/09/2017 2100   GLUCOSEU NEGATIVE 09/09/2017 2100   GLUCOSEU Negative 07/30/2013 1053   HGBUR NEGATIVE 09/09/2017 2100   BILIRUBINUR NEGATIVE 09/09/2017 2100   BILIRUBINUR Negative 07/30/2013 1053   KETONESUR NEGATIVE 09/09/2017 2100   PROTEINUR NEGATIVE 09/09/2017 2100   NITRITE NEGATIVE 09/09/2017 2100   LEUKOCYTESUR NEGATIVE 09/09/2017 2100   LEUKOCYTESUR Negative 07/30/2013 1053   Sepsis Labs Invalid input(s): PROCALCITONIN,  WBC,  LACTICIDVEN Microbiology Recent Results (from the past 240 hour(s))  SARS CORONAVIRUS 2 (TAT 6-24 HRS) Nasopharyngeal Nasopharyngeal Swab     Status: None   Collection Time: 12/18/19  2:00 PM   Specimen: Nasopharyngeal Swab  Result Value Ref Range Status   SARS Coronavirus 2 NEGATIVE NEGATIVE Final    Comment: (NOTE) SARS-CoV-2 target nucleic acids are NOT DETECTED. The  SARS-CoV-2 RNA is generally detectable in upper and lower respiratory specimens during the acute phase of infection. Negative results do not preclude SARS-CoV-2 infection, do not rule out co-infections with other pathogens, and should not be used as the sole basis for treatment or other patient management decisions. Negative results must be combined with clinical observations, patient history, and epidemiological information. The expected result is Negative. Fact Sheet for Patients: HairSlick.no Fact Sheet for Healthcare Providers: quierodirigir.com This test is not yet approved or cleared by the [place] FDA and  has been authorized for detection and/or diagnosis of  SARS-CoV-2 by FDA under an Emergency Use Authorization (EUA). This EUA will remain  in effect (meaning this test can be used) for the duration of the COVID-19 declaration under Section 56 4(b)(1) of the Act, 21 U.S.C. section 360bbb-3(b)(1), unless the authorization is terminated or revoked sooner. Performed at [facility], [address] [place], [place] [address]   Blood Culture (routine x 2)     Status: None (Preliminary result)   Collection Time: 12/18/19  3:16 PM   Specimen: BLOOD  Result Value Ref Range Status   Specimen Description BLOOD R FA  Final   Special Requests   Final    BOTTLES DRAWN AEROBIC AND ANAEROBIC Blood Culture adequate volume   Culture   Final    NO GROWTH < 24 HOURS Performed at [facility], [address] [place], [place] [address]    Report Status PENDING  Incomplete  Blood Culture (routine x 2)     Status: None (Preliminary result)   Collection Time: 12/18/19  3:16 PM   Specimen: BLOOD  Result Value Ref Range Status   Specimen Description BLOOD L W  Final   Special Requests   Final    BOTTLES DRAWN AEROBIC AND ANAEROBIC Blood Culture adequate volume   Culture   Final    NO GROWTH < 24 HOURS Performed at  [facility], [address] [place], [place] [address]    Report Status PENDING  Incomplete     Total time spend on discharging this patient, including the last patient exam, discussing the hospital stay, instructions for ongoing care as it relates to all pertinent caregivers, as well as preparing the medical discharge records, prescriptions, and/or referrals as applicable, is 45 minutes.    Darlin Priestly, MD  [facility] 12/19/2019, 11:27 AM  If 7PM-7AM, please contact night-coverage

## 2019-12-19 NOTE — TOC Initial Note (Addendum)
Transition of Care [facility]) - Initial/Assessment Note    Patient Details  Name: Stanley Nguyen MRN: [id number] Date of Birth: [birth year]  Transition [facility] Care [facility]) CM/SW Contact:    Maud Deed, LCSW Phone Number:[phone] 12/19/2019, 2:43 PM  Clinical Narrative:                 Spoke with Pt family and they are not in agreement with him coming home. MD and PT recommending Home Health with PT. Pt stated that he wants to go home. CSW  explained Home Health and provided a list of Home Health agencies via medicare. gov list.  [facility]. CS[facility] will contact [facility] to [facility].  Expected Discharge Plan: Home w Home Health Services Barriers to Discharge: No Barriers Identified   Patient Goals and CMS Choice Patient states their goals for this hospitalization and ongoing recovery are:: to get stronger CMS Medicare.gov Compare Post Acute Care list provided to:: Patient Choice offered to / list presented to : Patient  Expected Discharge Plan and Services Expected Discharge Plan: Home w Home Health Services     Post Acute Care Choice: Home Health Living arrangements for the past 2 months: Single Family Home Expected Discharge Date: 12/19/19                         HH Arranged: PT, OT, Social Work [employer] Agency: [facility] Date HH Agency Contacted: 12/19/19 Time HH Agency Contacted: 1439 Representative spoke with at [facility] Agency: Mellissa Kohut  Prior Living Arrangements/Services Living arrangements for the past 2 months: Single Family Home Lives with:: Spouse Patient language and need for interpreter reviewed:: Yes Do you feel safe going back to the place where you live?: No   seeking placement  Need for Family Participation in Patient Care: Yes (Comment) Care giver support system in place?: Yes (comment) Current home services: DME(walker) Criminal Activity/Legal Involvement Pertinent to Current Situation/Hospitalization: No -  Comment as needed  Activities of Daily Living      Permission Sought/Granted Permission sought to share information with : Family Supports Permission granted to share information with : Yes, Verbal Permission Granted  Share Information with NAME: Stanley Nguyen     Permission granted to share info w Relationship: Wife  Permission granted to share info w Contact Information: [phone]  Emotional Assessment Appearance:: Appears older than stated age, Disheveled Attitude/Demeanor/Rapport: Engaged Affect (typically observed): Blunt Orientation: : Oriented to Self, Oriented to Place, Oriented to  Time, Oriented to Situation Alcohol / Substance Use: Tobacco Use Psych Involvement: No (comment)  Admission diagnosis:  Delirium [R41.0] Acute encephalopathy [G93.40] Drug overdose, undetermined intent, initial encounter [T50.904A] Pneumonia of right lower lobe due to infectious organism [J18.9] Patient Active Problem List   Diagnosis Date Noted  . Acute encephalopathy 12/18/2019  . Pneumonia of right lower lobe due to infectious organism   . Drug overdose   . Chronic atrial fibrillation (HCC)   . Gangrene of toe of left foot (HCC) 09/09/2017  . Tobacco use disorder 07/26/2017  . Peripheral artery disease (HCC) 07/26/2017  . Acute osteomyelitis of metatarsal bone of right foot (HCC) 07/10/2017   PCP:  [name], MD Pharmacy:   [facility] [phone] - [place], [place] - [address] [place] [address] Phone: [phone] Fax: [phone]     Social Determinants of Health (SDOH) Interventions    Readmission Risk Interventions No flowsheet  data found.

## 2019-12-23 LAB — CULTURE, BLOOD (ROUTINE X 2)
Culture: NO GROWTH
Culture: NO GROWTH
Special Requests: ADEQUATE
Special Requests: ADEQUATE

## 2020-10-25 ENCOUNTER — Other Ambulatory Visit: Payer: Self-pay | Admitting: Internal Medicine

## 2020-10-25 DIAGNOSIS — R1310 Dysphagia, unspecified: Secondary | ICD-10-CM

## 2020-10-31 ENCOUNTER — Ambulatory Visit: Payer: Medicare HMO

## 2020-11-03 ENCOUNTER — Ambulatory Visit: Payer: Medicare HMO

## 2020-11-07 ENCOUNTER — Inpatient Hospital Stay: Admission: RE | Admit: 2020-11-07 | Payer: Medicare HMO | Source: Ambulatory Visit

## 2020-11-18 ENCOUNTER — Inpatient Hospital Stay: Payer: Medicare HMO

## 2020-11-18 ENCOUNTER — Other Ambulatory Visit: Payer: Self-pay

## 2020-11-18 ENCOUNTER — Inpatient Hospital Stay
Admission: EM | Admit: 2020-11-18 | Discharge: 2020-11-29 | DRG: 871 | Disposition: E | Payer: Medicare HMO | Attending: Internal Medicine | Admitting: Internal Medicine

## 2020-11-18 ENCOUNTER — Emergency Department: Payer: Medicare HMO

## 2020-11-18 DIAGNOSIS — Z515 Encounter for palliative care: Secondary | ICD-10-CM

## 2020-11-18 DIAGNOSIS — J449 Chronic obstructive pulmonary disease, unspecified: Secondary | ICD-10-CM | POA: Diagnosis present

## 2020-11-18 DIAGNOSIS — I2510 Atherosclerotic heart disease of native coronary artery without angina pectoris: Secondary | ICD-10-CM | POA: Diagnosis present

## 2020-11-18 DIAGNOSIS — R7303 Prediabetes: Secondary | ICD-10-CM | POA: Diagnosis present

## 2020-11-18 DIAGNOSIS — K529 Noninfective gastroenteritis and colitis, unspecified: Secondary | ICD-10-CM | POA: Diagnosis not present

## 2020-11-18 DIAGNOSIS — L02214 Cutaneous abscess of groin: Secondary | ICD-10-CM | POA: Diagnosis present

## 2020-11-18 DIAGNOSIS — I482 Chronic atrial fibrillation, unspecified: Secondary | ICD-10-CM | POA: Diagnosis present

## 2020-11-18 DIAGNOSIS — R7881 Bacteremia: Secondary | ICD-10-CM | POA: Diagnosis not present

## 2020-11-18 DIAGNOSIS — J1282 Pneumonia due to coronavirus disease 2019: Secondary | ICD-10-CM | POA: Diagnosis present

## 2020-11-18 DIAGNOSIS — U071 COVID-19: Secondary | ICD-10-CM | POA: Diagnosis present

## 2020-11-18 DIAGNOSIS — K56600 Partial intestinal obstruction, unspecified as to cause: Secondary | ICD-10-CM | POA: Diagnosis present

## 2020-11-18 DIAGNOSIS — E871 Hypo-osmolality and hyponatremia: Secondary | ICD-10-CM | POA: Diagnosis present

## 2020-11-18 DIAGNOSIS — G9340 Encephalopathy, unspecified: Secondary | ICD-10-CM | POA: Diagnosis not present

## 2020-11-18 DIAGNOSIS — A4102 Sepsis due to Methicillin resistant Staphylococcus aureus: Secondary | ICD-10-CM | POA: Diagnosis present

## 2020-11-18 DIAGNOSIS — J690 Pneumonitis due to inhalation of food and vomit: Secondary | ICD-10-CM | POA: Diagnosis present

## 2020-11-18 DIAGNOSIS — R0602 Shortness of breath: Secondary | ICD-10-CM

## 2020-11-18 DIAGNOSIS — R6520 Severe sepsis without septic shock: Secondary | ICD-10-CM | POA: Diagnosis not present

## 2020-11-18 DIAGNOSIS — N179 Acute kidney failure, unspecified: Secondary | ICD-10-CM | POA: Diagnosis present

## 2020-11-18 DIAGNOSIS — I700 Atherosclerosis of aorta: Secondary | ICD-10-CM | POA: Diagnosis present

## 2020-11-18 DIAGNOSIS — G9341 Metabolic encephalopathy: Secondary | ICD-10-CM | POA: Diagnosis present

## 2020-11-18 DIAGNOSIS — I248 Other forms of acute ischemic heart disease: Secondary | ICD-10-CM | POA: Diagnosis present

## 2020-11-18 DIAGNOSIS — I739 Peripheral vascular disease, unspecified: Secondary | ICD-10-CM | POA: Diagnosis present

## 2020-11-18 DIAGNOSIS — K7030 Alcoholic cirrhosis of liver without ascites: Secondary | ICD-10-CM | POA: Diagnosis present

## 2020-11-18 DIAGNOSIS — Z66 Do not resuscitate: Secondary | ICD-10-CM | POA: Diagnosis not present

## 2020-11-18 DIAGNOSIS — A0839 Other viral enteritis: Secondary | ICD-10-CM | POA: Diagnosis present

## 2020-11-18 DIAGNOSIS — G40909 Epilepsy, unspecified, not intractable, without status epilepticus: Secondary | ICD-10-CM

## 2020-11-18 DIAGNOSIS — G8929 Other chronic pain: Secondary | ICD-10-CM | POA: Diagnosis present

## 2020-11-18 DIAGNOSIS — Z87891 Personal history of nicotine dependence: Secondary | ICD-10-CM

## 2020-11-18 DIAGNOSIS — J440 Chronic obstructive pulmonary disease with acute lower respiratory infection: Secondary | ICD-10-CM | POA: Diagnosis present

## 2020-11-18 DIAGNOSIS — R54 Age-related physical debility: Secondary | ICD-10-CM | POA: Diagnosis present

## 2020-11-18 DIAGNOSIS — Z7189 Other specified counseling: Secondary | ICD-10-CM | POA: Diagnosis not present

## 2020-11-18 DIAGNOSIS — R7989 Other specified abnormal findings of blood chemistry: Secondary | ICD-10-CM | POA: Diagnosis present

## 2020-11-18 DIAGNOSIS — Z7901 Long term (current) use of anticoagulants: Secondary | ICD-10-CM | POA: Diagnosis not present

## 2020-11-18 DIAGNOSIS — I10 Essential (primary) hypertension: Secondary | ICD-10-CM | POA: Diagnosis present

## 2020-11-18 DIAGNOSIS — Z79891 Long term (current) use of opiate analgesic: Secondary | ICD-10-CM

## 2020-11-18 DIAGNOSIS — R4182 Altered mental status, unspecified: Secondary | ICD-10-CM

## 2020-11-18 DIAGNOSIS — J9601 Acute respiratory failure with hypoxia: Secondary | ICD-10-CM | POA: Diagnosis present

## 2020-11-18 DIAGNOSIS — R1110 Vomiting, unspecified: Secondary | ICD-10-CM | POA: Diagnosis not present

## 2020-11-18 DIAGNOSIS — B957 Other staphylococcus as the cause of diseases classified elsewhere: Secondary | ICD-10-CM | POA: Diagnosis not present

## 2020-11-18 DIAGNOSIS — J4489 Other specified chronic obstructive pulmonary disease: Secondary | ICD-10-CM | POA: Diagnosis present

## 2020-11-18 DIAGNOSIS — D689 Coagulation defect, unspecified: Secondary | ICD-10-CM | POA: Diagnosis present

## 2020-11-18 DIAGNOSIS — F1190 Opioid use, unspecified, uncomplicated: Secondary | ICD-10-CM | POA: Diagnosis present

## 2020-11-18 DIAGNOSIS — R778 Other specified abnormalities of plasma proteins: Secondary | ICD-10-CM | POA: Diagnosis present

## 2020-11-18 DIAGNOSIS — J189 Pneumonia, unspecified organism: Secondary | ICD-10-CM | POA: Diagnosis present

## 2020-11-18 DIAGNOSIS — Z888 Allergy status to other drugs, medicaments and biological substances status: Secondary | ICD-10-CM

## 2020-11-18 DIAGNOSIS — A419 Sepsis, unspecified organism: Secondary | ICD-10-CM | POA: Diagnosis present

## 2020-11-18 DIAGNOSIS — Z79899 Other long term (current) drug therapy: Secondary | ICD-10-CM

## 2020-11-18 DIAGNOSIS — R6521 Severe sepsis with septic shock: Secondary | ICD-10-CM | POA: Diagnosis present

## 2020-11-18 DIAGNOSIS — K7460 Unspecified cirrhosis of liver: Secondary | ICD-10-CM | POA: Diagnosis present

## 2020-11-18 LAB — COMPREHENSIVE METABOLIC PANEL
ALT: 12 U/L (ref 0–44)
AST: 47 U/L — ABNORMAL HIGH (ref 15–41)
Albumin: 1.7 g/dL — ABNORMAL LOW (ref 3.5–5.0)
Alkaline Phosphatase: 52 U/L (ref 38–126)
Anion gap: 17 — ABNORMAL HIGH (ref 5–15)
BUN: 29 mg/dL — ABNORMAL HIGH (ref 8–23)
CO2: 24 mmol/L (ref 22–32)
Calcium: 6.5 mg/dL — ABNORMAL LOW (ref 8.9–10.3)
Chloride: 89 mmol/L — ABNORMAL LOW (ref 98–111)
Creatinine, Ser: 2.12 mg/dL — ABNORMAL HIGH (ref 0.61–1.24)
GFR, Estimated: 32 mL/min — ABNORMAL LOW (ref >60)
Glucose, Bld: 162 mg/dL — ABNORMAL HIGH (ref 70–99)
Potassium: 3.1 mmol/L — ABNORMAL LOW (ref 3.5–5.1)
Sodium: 130 mmol/L — ABNORMAL LOW (ref 135–145)
Total Bilirubin: 1.5 mg/dL — ABNORMAL HIGH (ref 0.3–1.2)
Total Protein: 6.3 g/dL — ABNORMAL LOW (ref 6.5–8.1)

## 2020-11-18 LAB — CBC WITH DIFFERENTIAL/PLATELET
Abs Immature Granulocytes: 0.65 10*3/uL — ABNORMAL HIGH (ref 0.00–0.07)
Basophils Absolute: 0.1 10*3/uL (ref 0.0–0.1)
Basophils Relative: 0 %
Eosinophils Absolute: 0 10*3/uL (ref 0.0–0.5)
Eosinophils Relative: 0 %
HCT: 31.6 % — ABNORMAL LOW (ref 39.0–52.0)
Hemoglobin: 11 g/dL — ABNORMAL LOW (ref 13.0–17.0)
Immature Granulocytes: 3 %
Lymphocytes Relative: 4 %
Lymphs Abs: 0.9 10*3/uL (ref 0.7–4.0)
MCH: 29.3 pg (ref 26.0–34.0)
MCHC: 34.8 g/dL (ref 30.0–36.0)
MCV: 84.3 fL (ref 80.0–100.0)
Monocytes Absolute: 1.1 10*3/uL — ABNORMAL HIGH (ref 0.1–1.0)
Monocytes Relative: 5 %
Neutro Abs: 18.9 10*3/uL — ABNORMAL HIGH (ref 1.7–7.7)
Neutrophils Relative %: 88 %
Platelets: 119 10*3/uL — ABNORMAL LOW (ref 150–400)
RBC: 3.75 MIL/uL — ABNORMAL LOW (ref 4.22–5.81)
RDW: 13.2 % (ref 11.5–15.5)
WBC: 21.6 10*3/uL — ABNORMAL HIGH (ref 4.0–10.5)
nRBC: 0 % (ref 0.0–0.2)

## 2020-11-18 LAB — URINALYSIS, COMPLETE (UACMP) WITH MICROSCOPIC
Bilirubin Urine: NEGATIVE
Glucose, UA: NEGATIVE mg/dL
Ketones, ur: NEGATIVE mg/dL
Leukocytes,Ua: NEGATIVE
Nitrite: NEGATIVE
Protein, ur: 100 mg/dL — AB
Specific Gravity, Urine: 1.015 (ref 1.005–1.030)
pH: 5 (ref 5.0–8.0)

## 2020-11-18 LAB — APTT: aPTT: 49 seconds — ABNORMAL HIGH (ref 24–36)

## 2020-11-18 LAB — LIPASE, BLOOD: Lipase: 19 U/L (ref 11–51)

## 2020-11-18 LAB — PROTIME-INR
INR: 2.4 — ABNORMAL HIGH (ref 0.8–1.2)
Prothrombin Time: 25.6 seconds — ABNORMAL HIGH (ref 11.4–15.2)

## 2020-11-18 LAB — POC SARS CORONAVIRUS 2 AG -  ED: SARS Coronavirus 2 Ag: POSITIVE — AB

## 2020-11-18 LAB — TROPONIN I (HIGH SENSITIVITY)
Troponin I (High Sensitivity): 91 ng/L — ABNORMAL HIGH (ref <18)
Troponin I (High Sensitivity): 82 ng/L — ABNORMAL HIGH (ref <18)

## 2020-11-18 LAB — LACTIC ACID, PLASMA
Lactic Acid, Venous: 4.7 mmol/L (ref 0.5–1.9)
Lactic Acid, Venous: 2.6 mmol/L (ref 0.5–1.9)

## 2020-11-18 MED ORDER — ASCORBIC ACID 500 MG PO TABS
500.0000 mg | ORAL_TABLET | Freq: Every day | ORAL | Status: DC
  Administered 2020-11-18: 500 mg via ORAL
  Filled 2020-11-18: qty 1

## 2020-11-18 MED ORDER — SODIUM CHLORIDE 0.9 % IV SOLN
2.0000 g | Freq: Once | INTRAVENOUS | Status: AC
  Administered 2020-11-18: 2 g via INTRAVENOUS
  Filled 2020-11-18: qty 2

## 2020-11-18 MED ORDER — SODIUM CHLORIDE 0.9 % IV BOLUS (SEPSIS)
1000.0000 mL | Freq: Once | INTRAVENOUS | Status: AC
  Administered 2020-11-18: 1000 mL via INTRAVENOUS

## 2020-11-18 MED ORDER — SODIUM CHLORIDE 0.9 % IV BOLUS (SEPSIS)
250.0000 mL | Freq: Once | INTRAVENOUS | Status: AC
  Administered 2020-11-18: 250 mL via INTRAVENOUS

## 2020-11-18 MED ORDER — VANCOMYCIN HCL IN DEXTROSE 1-5 GM/200ML-% IV SOLN: 1000.0000 mg | Freq: Once | INTRAVENOUS | Status: DC

## 2020-11-18 MED ORDER — LACOSAMIDE 50 MG PO TABS
50.0000 mg | ORAL_TABLET | Freq: Two times a day (BID) | ORAL | Status: DC
  Administered 2020-11-18: 50 mg via ORAL
  Filled 2020-11-18: qty 1

## 2020-11-18 MED ORDER — SODIUM CHLORIDE 0.9 % IV SOLN
100.0000 mg | Freq: Every day | INTRAVENOUS | Status: AC
  Administered 2020-11-19 – 2020-11-22 (×4): 100 mg via INTRAVENOUS
  Filled 2020-11-18 (×4): qty 20

## 2020-11-18 MED ORDER — VANCOMYCIN HCL 1500 MG/300ML IV SOLN
1500.0000 mg | Freq: Once | INTRAVENOUS | Status: AC
  Administered 2020-11-18: 1500 mg via INTRAVENOUS
  Filled 2020-11-18: qty 300

## 2020-11-18 MED ORDER — ONDANSETRON HCL 4 MG PO TABS: 4.0000 mg | ORAL_TABLET | Freq: Four times a day (QID) | ORAL | Status: DC | PRN

## 2020-11-18 MED ORDER — LACTATED RINGERS IV SOLN: INTRAVENOUS | Status: DC

## 2020-11-18 MED ORDER — ZINC SULFATE 220 (50 ZN) MG PO CAPS
220.0000 mg | ORAL_CAPSULE | Freq: Every day | ORAL | Status: DC
  Administered 2020-11-18: 220 mg via ORAL
  Filled 2020-11-18: qty 1

## 2020-11-18 MED ORDER — METRONIDAZOLE IN NACL 5-0.79 MG/ML-% IV SOLN
500.0000 mg | Freq: Three times a day (TID) | INTRAVENOUS | Status: DC
  Administered 2020-11-19 – 2020-11-21 (×9): 500 mg via INTRAVENOUS
  Filled 2020-11-18 (×11): qty 100

## 2020-11-18 MED ORDER — SODIUM CHLORIDE 0.9 % IV SOLN
200.0000 mg | Freq: Once | INTRAVENOUS | Status: AC
  Administered 2020-11-18: 200 mg via INTRAVENOUS
  Filled 2020-11-18: qty 200

## 2020-11-18 MED ORDER — GUAIFENESIN-DM 100-10 MG/5ML PO SYRP: 10.0000 mL | ORAL_SOLUTION | ORAL | Status: DC | PRN

## 2020-11-18 MED ORDER — DEXAMETHASONE SODIUM PHOSPHATE 10 MG/ML IJ SOLN
8.0000 mg | Freq: Once | INTRAMUSCULAR | Status: AC
  Administered 2020-11-18: 8 mg via INTRAVENOUS
  Filled 2020-11-18: qty 1

## 2020-11-18 MED ORDER — ONDANSETRON HCL 4 MG/2ML IJ SOLN: 4.0000 mg | Freq: Four times a day (QID) | INTRAMUSCULAR | Status: DC | PRN

## 2020-11-18 MED ORDER — SODIUM CHLORIDE 0.9 % IV SOLN: 2.0000 g | Freq: Once | INTRAVENOUS | Status: DC

## 2020-11-18 MED ORDER — ALBUTEROL SULFATE HFA 108 (90 BASE) MCG/ACT IN AERS
2.0000 | INHALATION_SPRAY | Freq: Four times a day (QID) | RESPIRATORY_TRACT | Status: DC
  Administered 2020-11-18 – 2020-11-19 (×3): 2 via RESPIRATORY_TRACT
  Filled 2020-11-18 (×2): qty 6.7

## 2020-11-18 MED ORDER — RIVAROXABAN 20 MG PO TABS: 20.0000 mg | ORAL_TABLET | Freq: Every evening | ORAL | Status: DC

## 2020-11-18 MED ORDER — SODIUM CHLORIDE 0.9 % IV SOLN: INTRAVENOUS | Status: AC

## 2020-11-18 MED ORDER — ACETAMINOPHEN 650 MG RE SUPP: 650.0000 mg | Freq: Four times a day (QID) | RECTAL | Status: DC | PRN

## 2020-11-18 MED ORDER — HYDROCOD POLST-CPM POLST ER 10-8 MG/5ML PO SUER: 5.0000 mL | Freq: Two times a day (BID) | ORAL | Status: DC | PRN

## 2020-11-18 MED ORDER — ACETAMINOPHEN 325 MG PO TABS: 650.0000 mg | ORAL_TABLET | Freq: Four times a day (QID) | ORAL | Status: DC | PRN

## 2020-11-18 NOTE — ED Provider Notes (Signed)
[facility] Emergency Department Provider Note   ____________________________________________    I have reviewed the triage vital signs and the nursing notes.   HISTORY  Chief Complaint Code Sepsis, Shortness of Breath, and Altered Mental Status  History is severely limited.   HPI Stanley Nguyen is a 75 y.o. male with history of atrial fibrillation, on Xarelto, CAD, COPD, liver cirrhosis, PAD who presents with altered mental status, shortness of breath. Per EMS patient's daughter reported patient has become more short of breath over the last several days and became confused today. He is reportedly not vaccinated against COVID-19  Past Medical History:  Diagnosis Date  . Atrial fibrillation (HCC)   . CAD (coronary artery disease)   . COPD (chronic obstructive pulmonary disease) (HCC)   . Dysrhythmia    af. patient unaware of a diagnosis of a fib  . Gangrene (HCC) 09/27/2017   left foot/toes  . Hypertension   . Liver cirrhosis (HCC)   . Liver cirrhosis (HCC)   . Osteomyelitis (HCC) 2018   right ankle and foot  . Peripheral vascular disease (HCC)   . Pre-diabetes   . Seizure disorder (HCC) 2015   last seizure 2-3 years ago. takes vimpat    Patient Active Problem List   Diagnosis Date Noted  . HTN (hypertension) 11/11/2020  . Cirrhosis (HCC) 11/28/2020  . COPD with chronic bronchitis (HCC) 10/29/2020  . CAD (coronary artery disease) 11/01/2020  . Chronic anticoagulation 11/10/2020  . Chronic, continuous use of opioids 10/30/2020  . AKI (acute kidney injury) (HCC) 11/05/2020  . Hyponatremia 11/17/2020  . Elevated troponin 11/08/2020  . Severe sepsis (HCC) 11/17/2020  . Acute metabolic encephalopathy 10/29/2020  . COVID-19 virus infection 10/31/2020  . Acute respiratory failure with hypoxia (HCC) 11/26/2020  . Seizure disorder (HCC) 11/12/2020  . Acute encephalopathy 12/18/2019  . Pneumonia of right lower lobe due to infectious organism    . Drug overdose   . Chronic atrial fibrillation (HCC)   . Gangrene of toe of left foot (HCC) 09/09/2017  . Tobacco use disorder 07/26/2017  . Peripheral artery disease (HCC) 07/26/2017  . Chronic pain 07/23/2017  . Acute osteomyelitis of metatarsal bone of right foot (HCC) 07/10/2017  . Peripheral vascular disease, unspecified (HCC) 05/13/2014    Past Surgical History:  Procedure Laterality Date  . AMPUTATION TOE Left 09/27/2017   Procedure: AMPUTATION TOE-LEFT GREAT TOE, LEFT SECOND TOE;  Surgeon: Gwyneth Revels, DPM;  Location: [facility] ORS;  Service: Podiatry;  Laterality: Left;  . CARDIAC CATHETERIZATION     no stents  . ENDARTERECTOMY FEMORAL Right 07/17/2017   Procedure: ENDARTERECTOMY FEMORAL;  Surgeon: Annice Needy, MD;  Location: [facility];  Service: Vascular;  Laterality: Right;  . HERNIA REPAIR  1998   umbilical  . IRRIGATION AND DEBRIDEMENT FOOT Right 07/12/2017   Procedure: IRRIGATION AND DEBRIDEMENT FOOT;  Surgeon: Gwyneth Revels, DPM;  Location: [facility];  Service: Podiatry;  Laterality: Right;  . LOWER EXTREMITY ANGIOGRAM Right 07/17/2017   Procedure: LOWER EXTREMITY ANGIOGRAM ( FSA STENT PLACEMENT );  Surgeon: Annice Needy, MD;  Location: [facility];  Service: Vascular;  Laterality: Right;  . LOWER EXTREMITY ANGIOGRAPHY Right 07/15/2017   Procedure: Lower Extremity Angiography;  Surgeon: Annice Needy, MD;  Location: [facility];  Service: Cardiovascular;  Laterality: Right;  . LOWER EXTREMITY ANGIOGRAPHY Left 09/11/2017   Procedure: Lower Extremity Angiography;  Surgeon: Annice Needy, MD;  Location: [facility];  Service:  Cardiovascular;  Laterality: Left;  . LOWER EXTREMITY ANGIOGRAPHY Left 09/13/2017   Procedure: Lower Extremity Angiography;  Surgeon: Annice Needy, MD;  Location: [facility];  Service: Cardiovascular;  Laterality: Left;  . LOWER EXTREMITY INTERVENTION  09/11/2017   Procedure: LOWER EXTREMITY INTERVENTION;  Surgeon: Annice Needy, MD;   Location: [facility];  Service: Cardiovascular;;    Prior to Admission medications   Medication Sig Start Date End Date Taking? Authorizing Provider  amLODipine (NORVASC) 5 MG tablet Take 5 mg by mouth daily.    [provider]  gabapentin (NEURONTIN) 300 MG capsule Take 300 mg by mouth 3 (three) times daily.    [provider]  nitroGLYCERIN (NITROSTAT) 0.4 MG SL tablet Place 0.4 mg under the tongue every 5 (five) minutes as needed for chest pain.    [provider]  OXYCONTIN 30 MG 12 hr tablet Hold until you follow up with your outpatient provider, since you have had an unintentional overdose event. 12/19/19   Darlin Priestly, MD  sotalol (BETAPACE) 80 MG tablet Take 80 mg by mouth daily.    [provider]  SPIRIVA HANDIHALER 18 MCG inhalation capsule Place 18 mcg into inhaler and inhale daily.     [provider]  VIMPAT 50 MG TABS tablet Take 50 mg by mouth 2 (two) times daily.    [provider]  XARELTO 20 MG TABS tablet Take 20 mg by mouth every evening.     [provider]     Allergies Keppra [levetiracetam]  No family history on file.  Social History Social History   Tobacco Use  . Smoking status: Former Smoker    Packs/day: 1.00    Types: Cigarettes    Quit date: 09/04/2017    Years since quitting: 3.2  . Smokeless tobacco: Never Used  . Tobacco comment: still smokes but less  Vaping Use  . Vaping Use: Never used  Substance Use Topics  . Alcohol use: No    Comment: previously a drinker. stopped 6 years ago.  . Drug use: No    Review of Systems     ____________________________________________   PHYSICAL EXAM:  VITAL SIGNS: ED Triage Vitals [11/20/2020 1850]  Enc Vitals Group     BP (!) 121/95     Pulse Rate 88     Resp (!) 24     Temp 98.4 F (36.9 C)     Temp Source Oral     SpO2 95 %     Weight 68 kg (150 lb)     Height 1.778 m (5\' 10" )     Head Circumference      Peak Flow       Pain Score 0     Pain Loc      Pain Edu?      Excl. in GC?     Constitutional: Alert, disoriented Eyes: Conjunctivae are normal.  Head: Atraumatic. Nose: No congestion/rhinnorhea. Mouth/Throat: Mucous membranes are moist.   Neck:  Painless ROM Cardiovascular: Normal rate, regular rhythm. Grossly normal heart sounds.  Good peripheral circulation. Respiratory: Increased respiratory effort with tachypnea, bibasilar Rales Gastrointestinal: Soft and nontender. No distention.    Musculoskeletal: No lower extremity tenderness nor edema.  Warm and well perfused, 1+ pulses DP bilaterally Neurologic:  Normal speech and language. Moving all extremities Skin:  Skin is warm, dry   ____________________________________________   LABS (all labs ordered are listed, but only abnormal results are displayed)  Labs Reviewed  LACTIC ACID, PLASMA - Abnormal; Notable for the following components:      Result Value   Lactic Acid, Venous 4.7 (*)    All other components within normal limits  COMPREHENSIVE METABOLIC PANEL - Abnormal; Notable for the following components:   Sodium 130 (*)    Potassium 3.1 (*)    Chloride 89 (*)    Glucose, Bld 162 (*)    BUN 29 (*)    Creatinine, Ser 2.12 (*)    Calcium 6.5 (*)    Total Protein 6.3 (*)    Albumin 1.7 (*)    AST 47 (*)    Total Bilirubin 1.5 (*)    GFR, Estimated 32 (*)    Anion gap 17 (*)    All other components within normal limits  CBC WITH DIFFERENTIAL/PLATELET - Abnormal; Notable for the following components:   WBC 21.6 (*)    RBC 3.75 (*)    Hemoglobin 11.0 (*)    HCT 31.6 (*)    Platelets 119 (*)    Neutro Abs 18.9 (*)    Monocytes Absolute 1.1 (*)    Abs Immature Granulocytes 0.65 (*)    All other components within normal limits  PROTIME-INR - Abnormal; Notable for the following components:   Prothrombin Time 25.6 (*)    INR 2.4 (*)    All other components within normal limits  APTT - Abnormal; Notable for the following  components:   aPTT 49 (*)    All other components within normal limits  POC SARS CORONAVIRUS 2 AG -  ED - Abnormal; Notable for the following components:   SARS Coronavirus 2 Ag Positive (*)    All other components within normal limits  TROPONIN I (HIGH SENSITIVITY) - Abnormal; Notable for the following components:   Troponin I (High Sensitivity) 91 (*)    All other components within normal limits  CULTURE, BLOOD (ROUTINE X 2)  CULTURE, BLOOD (ROUTINE X 2)  URINE CULTURE  LACTIC ACID, PLASMA  URINALYSIS, COMPLETE (UACMP) WITH MICROSCOPIC  TROPONIN I (HIGH SENSITIVITY)   ____________________________________________  EKG  ED ECG REPORT I, Jene Every, the attending physician, personally viewed and interpreted this ECG.  Date: 11/26/2020  Rhythm: normal sinus rhythm QRS Axis: normal Intervals: normal ST/T Wave abnormalities: Nonspecific change Narrative Interpretation: no evidence of acute ischemia  ____________________________________________  RADIOLOGY  Chest x-ray viewed by me, no clear infiltrate ____________________________________________   PROCEDURES  Procedure(s) performed: No  Procedures   Critical Care performed: yes  CRITICAL CARE Performed by: Jene Every   Total critical care time: 30 minutes  Critical care time was exclusive of separately billable procedures and treating other patients.  Critical care was necessary to treat or prevent imminent or life-threatening deterioration.  Critical care was time spent personally by me on the following activities: development of treatment plan with patient and/or surrogate as well as nursing, discussions with consultants, evaluation of patient's response to treatment, examination of patient, obtaining history from patient or surrogate, ordering and performing treatments and interventions, ordering and review of laboratory studies, ordering and review of radiographic studies, pulse oximetry and  re-evaluation of patient's condition.  ____________________________________________   INITIAL IMPRESSION / ASSESSMENT AND PLAN / ED COURSE  Pertinent labs & imaging results that were available during my care of the patient were reviewed by me and considered in my medical decision making (see chart for details).  Patient presents with altered mental status, increased work of breathing, nonvaccinated against COVID-19  Differential includes sepsis,  COVID pneumonia, bacterial pneumonia, UTI. Abdominal exam is overall reassuring.  Rapid COVID test is positive lessening the likelihood of sepsis, suspect this is COVID related illness.  Chest x-ray is however not consistent with COVID-pneumonia  Lab work is more suspicious for sepsis  given elevated white blood cell count, elevated lactic acid of 4.7. Will cover with broad-spectrum antibiotics. We will give 30 mils per kilogram of fluids.  Discussed with Dr. Para Marchuncan of the hospital service will admit the patient   Sepsis - Repeat Assessment  Performed at:    2040  Vitals     Blood pressure 106/80, pulse 88, temperature 98.1 F (36.7 C), temperature source Oral, resp. rate 18, height 1.778 m (5\' 10" ), weight 68 kg, SpO2 100 %.  Heart:     Regular rate and rhythm  Lungs:    CTA  Capillary Refill:   <2 sec  Peripheral Pulse:   Radial pulse palpable  Skin:     Normal Color       ____________________________________________   FINAL CLINICAL IMPRESSION(S) / ED DIAGNOSES  Final diagnoses:  Severe sepsis (HCC)  AKI (acute kidney injury) (HCC)  Altered mental status, unspecified altered mental status type        Note:  This document was prepared using Dragon voice recognition software and may include unintentional dictation errors.   Jene EveryKinner, Garry Nicolini, MD 12/22/2020 [phone]

## 2020-11-18 NOTE — ED Triage Notes (Signed)
Pt presents to the [facility] via EMS as code sepsis with c/o shortness of breath, generalized weakness, and altered mental status. EMS states that pt's daughter called EMS after he became confused with worsened dyspnea and weakness. Pt was seen by PCP yesterday and diagnosed with pneumonia.

## 2020-11-18 NOTE — Progress Notes (Signed)
Remdesivir - Pharmacy Brief Note   O:  ALT: 12  CXR:  SpO2: % on    A/P:  Remdesivir 200 mg IVPB once followed by 100 mg IVPB daily x 4 days.   Stanley Nguyen D 12/01/20 9:33 PM

## 2020-11-18 NOTE — ED Notes (Signed)
Pt laying in bed on cardiac, bp and pulse ox monitor. Pt on 4lpm via [place] of oxygen. Dr. Para March at bedside and informed of troponin of 82 and lactic acid of 2.6. no new orders provided by provider.

## 2020-11-18 NOTE — Consult Note (Signed)
CODE SEPSIS - PHARMACY COMMUNICATION  **Broad Spectrum Antibiotics should be administered within 1 hour of Sepsis diagnosis**  Time Code Sepsis Called/Page Received: 8341  Antibiotics Ordered: vancomycin and cefepime  Time of 1st antibiotic administration: pending  Additional action taken by pharmacy: Contacted EDP for abx consult/order @ 2000  If necessary, Name of Provider/Nurse Contacted: Earna Coder, EDP Kinner  Reatha Armour, PharmD Pharmacy Resident  11/09/2020 7:11 PM

## 2020-11-18 NOTE — H&P (Addendum)
History and Physical    GIANMARCO ROYE VAN:[id number] DOB: 27-Nov-1945 DOA: 11/16/2020  PCP: Jodi Marble, MD   Patient coming from: Home  I have personally briefly reviewed patient's old medical records in [place]  Chief Complaint: Altered mental status  HPI: AARO MEYERS is a 75 y.o. male with medical history significant for A. fib on Xarelto, seizure disorder on Vimpat, alcoholic cirrhosis(stopped drinking 5 years ago), CAD, COPD, HTN, unvaccinated against COVID, who was in his usual state of health until 5 days ago when he stopped eating.  Most of the history is taken from the wife over the phone due to the patient's change in mental status.  She says usually he will eat his meals but had just been eating snacks.  He had a cough and shortness of breath.  His wife tried to offer him her nebulizer but he would not agree to that.  As the days progressed he became weaker and he appeared confused at times.  On the day of arrival, after getting his morning medication, which his wife administers, he started vomiting and complained of abdominal pain.  The home health nurse advised that he should be checked out in the emergency room.  Wife said when EMS arrived, he was found to have a fever, and had soiled himself and appeared to have diarrhea.  EMS reported O2 sats of 89% on room air ED Course: On arrival, he was afebrile, initially with normal blood pressure of 121/95 with heart rate 88, tachypneic at 24 with O2 sat 95% on 4 L.  During work-up in the ER, blood pressure dropped as low as 83/72, with heart rate of 90.  Blood work significant for leukocytosis of 21,000 with lactic acid of 4.7.  CMP revealed creatinine of 2.12, up from baseline of 1.19 as well as sodium of 130.  Mildly elevated AST of 47 and total bilirubin of 1.5.  Troponin 91.  Urinalysis without pyuria.  COVID PCR positive EKG as reviewed by me : NSR at 88 with no acute Imaging: Chest x-ray, mild bibasilar atelectasis  Patient  started on sepsis fluids, broad-spectrum antibiotics.  Hospitalist consulted for admission.  Review of Systems: Unable to obtain due to altered mental status  Past Medical History:  Diagnosis Date  . Atrial fibrillation ([place])   . CAD (coronary artery disease)   . COPD (chronic obstructive pulmonary disease) ([place])   . Dysrhythmia    af. patient unaware of a diagnosis of a fib  . Gangrene ([place]) 09/27/2017   left foot/toes  . Hypertension   . Liver cirrhosis ([place])   . Liver cirrhosis ([place])   . Osteomyelitis ([place]) 2018   right ankle and foot  . Peripheral vascular disease ([place])   . Pre-diabetes   . Seizure disorder ([place]) 2015   last seizure 2-3 years ago. takes vimpat    Past Surgical History:  Procedure Laterality Date  . AMPUTATION TOE Left 09/27/2017   Procedure: AMPUTATION TOE-LEFT GREAT TOE, LEFT SECOND TOE;  Surgeon: Samara Deist, DPM;  Location: [facility] OR[facility];  Service: Podiatry;  Laterality: Left;  . CARDIAC CATHETERIZATION     no stents  . ENDARTERECTOMY FEMORAL Right 07/17/2017   Procedure: ENDARTERECTOMY FEMORAL;  Surgeon: Algernon Huxley, MD;  Location: [facility] ORS;  Service: Vascular;  Laterality: Right;  . HERNIA REPAIR  4599   umbilical  . IRRIGATION AND DEBRIDEMENT FOOT Right 07/12/2017   Procedure: IRRIGATION AND DEBRIDEMENT FOOT;  Surgeon: Samara Deist, DPM;  Location:  [facility];  Service: Podiatry;  Laterality: Right;  . LOWER EXTREMITY ANGIOGRAM Right 07/17/2017   Procedure: LOWER EXTREMITY ANGIOGRAM ( FSA STENT PLACEMENT );  Surgeon: Algernon Huxley, MD;  Location: [facility] OR[facility];  Service: Vascular;  Laterality: Right;  . LOWER EXTREMITY ANGIOGRAPHY Right 07/15/2017   Procedure: Lower Extremity Angiography;  Surgeon: Algernon Huxley, MD;  Location: [place] [facility];  Service: Cardiovascular;  Laterality: Right;  . LOWER EXTREMITY ANGIOGRAPHY Left 09/11/2017   Procedure: Lower Extremity Angiography;  Surgeon: Algernon Huxley, MD;  Location: [place] [facility];  Service:  Cardiovascular;  Laterality: Left;  . LOWER EXTREMITY ANGIOGRAPHY Left 09/13/2017   Procedure: Lower Extremity Angiography;  Surgeon: Algernon Huxley, MD;  Location: [place] [facility];  Service: Cardiovascular;  Laterality: Left;  . LOWER EXTREMITY INTERVENTION  09/11/2017   Procedure: LOWER EXTREMITY INTERVENTION;  Surgeon: Algernon Huxley, MD;  Location: [place] [facility];  Service: Cardiovascular;;     reports that he quit smoking about 3 years ago. His smoking use included cigarettes. He smoked 1.00 pack per day. He has never used smokeless tobacco. He reports that he does not drink alcohol and does not use drugs.  Allergies  Allergen Reactions  . Keppra [Levetiracetam] Rash    No family history on file. unable to review due to patient's medical condition   Prior to Admission medications   Medication Sig Start Date End Date Taking? Authorizing Provider  amLODipine (NORVASC) 5 MG tablet Take 5 mg by mouth daily.    [provider]  gabapentin (NEURONTIN) 300 MG capsule Take 300 mg by mouth 3 (three) times daily.    [provider]  nitroGLYCERIN (NITROSTAT) 0.4 MG SL tablet Place 0.4 mg under the tongue every 5 (five) minutes as needed for chest pain.    [provider]  OXYCONTIN 30 MG 12 hr tablet Hold until you follow up with your outpatient provider, since you have had an unintentional overdose event. 12/19/19   Enzo Bi, MD  sotalol (BETAPACE) 80 MG tablet Take 80 mg by mouth daily.    [provider]  SPIRIVA HANDIHALER 18 MCG inhalation capsule Place 18 mcg into inhaler and inhale daily.     [provider]  VIMPAT 50 MG TABS tablet Take 50 mg by mouth 2 (two) times daily.    [provider]  XARELTO 20 MG TABS tablet Take 20 mg by mouth every evening.     [provider]    Physical Exam: Vitals:   11/10/2020 1900 11/05/2020 1945 11/07/2020 2010 11/07/2020 2030  BP: (!) 149/131 128/84 98/75 106/80  Pulse: 85 88 90 88   Resp: (!) '21 14 19 18  ' Temp: 98.1 F (36.7 C)     TempSrc: Oral     SpO2: (!) 89% 95% 100% 100%  Weight:      Height:         Vitals:   11/28/2020 1900 11/19/2020 1945 11/27/2020 2010 11/08/2020 2030  BP: (!) 149/131 128/84 98/75 106/80  Pulse: 85 88 90 88  Resp: (!) '21 14 19 18  ' Temp: 98.1 F (36.7 C)     TempSrc: Oral     SpO2: (!) 89% 95% 100% 100%  Weight:      Height:        Constitutional:  Ill-appearing, frail, elderly male, lethargic, drowsy, arousable to gentle shaking but readily falls back asleep . HEENT:      Head: Normocephalic and atraumatic.  Eyes: PERLA, EOMI, Conjunctivae are normal. Sclera is non-icteric.       Mouth/Throat: Mucous membranes are moist.       Neck: Supple with no signs of meningismus. Cardiovascular: Regular rate and rhythm. No murmurs, gallops, or rubs. 2+ symmetrical distal pulses are present . No JVD. No LE edema Respiratory: Respiratory effort normal .Lungs sounds coarse Gastrointestinal: Soft, groans with palpation in all quadrants, and non distended with positive bowel sounds.  Genitourinary: No CVA tenderness. Musculoskeletal: Nontender with normal range of motion in all extremities. No cyanosis, or erythema of extremities. Neurologic:  Face is symmetric. Moving all extremities. No gross focal neurologic deficits . Skin: Skin is warm, dry.  No rash or ulcers Psychiatric: Lethargic   Labs on Admission: I have personally reviewed following labs and imaging studies  CBC: Recent Labs  Lab 11/02/2020 1857  WBC 21.6*  NEUTROABS 18.9*  HGB 11.0*  HCT 31.6*  MCV 84.3  PLT 686*   Basic Metabolic Panel: Recent Labs  Lab 11/10/2020 1857  NA 130*  K 3.1*  CL 89*  CO2 24  GLUCOSE 162*  BUN 29*  CREATININE 2.12*  CALCIUM 6.5*   GFR: Estimated Creatinine Clearance: 29.8 mL/min (A) (by C-G formula based on SCr of 2.12 mg/dL (H)). Liver Function Tests: Recent Labs  Lab 11/15/2020 1857  AST 47*  ALT 12  ALKPHOS 52  BILITOT  1.5*  PROT 6.3*  ALBUMIN 1.7*   No results for input(s): LIPASE, AMYLASE in the last 168 hours. No results for input(s): AMMONIA in the last 168 hours. Coagulation Profile: Recent Labs  Lab 11/02/2020 1857  INR 2.4*   Cardiac Enzymes: No results for input(s): CKTOTAL, CKMB, CKMBINDEX, TROPONINI in the last 168 hours. BNP (last 3 results) No results for input(s): PROBNP in the last 8760 hours. HbA1C: No results for input(s): HGBA1C in the last 72 hours. CBG: No results for input(s): GLUCAP in the last 168 hours. Lipid Profile: No results for input(s): CHOL, HDL, LDLCALC, TRIG, CHOLHDL, LDLDIRECT in the last 72 hours. Thyroid Function Tests: No results for input(s): TSH, T4TOTAL, FREET4, T3FREE, THYROIDAB in the last 72 hours. Anemia Panel: No results for input(s): VITAMINB12, FOLATE, FERRITIN, TIBC, IRON, RETICCTPCT in the last 72 hours. Urine analysis:    Component Value Date/Time   COLORURINE AMBER (A) 10/30/2020 1858   APPEARANCEUR HAZY (A) 11/22/2020 1858   APPEARANCEUR Clear 07/30/2013 1053   LABSPEC 1.015 11/06/2020 1858   LABSPEC 1.011 07/30/2013 1053   PHURINE 5.0 11/21/2020 1858   GLUCOSEU NEGATIVE 10/30/2020 1858   GLUCOSEU Negative 07/30/2013 1053   HGBUR SMALL (A) 11/16/2020 1858   BILIRUBINUR NEGATIVE 11/09/2020 1858   BILIRUBINUR Negative 07/30/2013 1053   [place] 10/29/2020 1858   PROTEINUR 100 (A) 11/09/2020 1858   NITRITE NEGATIVE 11/27/2020 1858   LEUKOCYTESUR NEGATIVE 10/30/2020 1858   LEUKOCYTESUR Negative 07/30/2013 1053    Radiological Exams on Admission: CT ABDOMEN PELVIS WO CONTRAST  Result Date: 11/07/2020 CLINICAL DATA:  COVID pneumonia, generalized weakness, sepsis, vomiting, unspecified abdominal pain EXAM: CT ABDOMEN AND PELVIS WITHOUT CONTRAST TECHNIQUE: Multidetector CT imaging of the abdomen and pelvis was performed following the standard protocol without IV contrast. COMPARISON:  None. FINDINGS: Lower chest: There is  asymmetric peribronchial nodularity within the visualized lung bases with airway impaction noted, best noted within the segmental and subsegmental bronchi of the left lower lobe and basilar lingula in keeping with acute infection or aspiration. Trace bilateral pleural effusions are present. Right basilar parenchymal scarring is  noted. Extensive right coronary artery calcification. Global cardiac size within normal limits. No pericardial effusion. Small gastroesophageal varices are identified. Hepatobiliary: There is hypertrophy of the left hepatic lobe and caudate lobe and the liver contour is mildly nodular in keeping with changes of cirrhosis. Recanalization of the umbilical vein is noted. No definite focal intrahepatic mass identified on this noncontrast examination. No definite intra or extrahepatic biliary ductal dilation. Gallbladder unremarkable. Pancreas: Unremarkable Spleen: Unremarkable Adrenals/Urinary Tract: The adrenal glands are unremarkable. The right kidney is markedly atrophic. The left kidney is normal in size and position and demonstrates preserved cortical thickness. There is no hydronephrosis. Extensive vascular calcifications are noted within the renal hilum. Mild nonspecific perinephric stranding is present. No loculated pararenal fluid collections identified. No ureteral calculi. The bladder is unremarkable. Stomach/Bowel: The stomach is fluid-filled, as is the duodenum. There is asymmetric mesenteric edema and circumferential bowel wall thickening involving the proximal jejunum which appears mildly dilated suggesting changes of a a infectious or inflammatory focal enteritis with resultant partial small bowel obstruction. A discrete focal transition point is not identified. The remainder of the small bowel is unremarkable. There is large volume stool within the distal colon and rectal vault. No free intraperitoneal gas or fluid. The appendix is unremarkable. Vascular/Lymphatic: Extensive  aortoiliac atherosclerotic calcification is present. Particularly prominent atherosclerotic calcification is seen within the a proximal renal arteries and mesenteric arteries with almost certain hemodynamically significant stenosis of the celiac axis and superior mesenteric artery proximally, though this is not well assessed on this noncontrast examination. Bilateral lower extremity arterial inflow stenting has been performed. Inferior vena cava filter is seen within the infrarenal cava. No pathologic adenopathy within the abdomen and pelvis. Reproductive: Prostate is unremarkable. Other: There is infiltrative change within the right inguinal region superficial to the a common femoral artery. This may represent the sequela of a recent vascular intervention with resultant hematoma, a local inflammatory process such as a developing abscess or suppurative lymph node, or an infiltrative mass. This is not well assessed on this noncontrast examination. This measures 2.4 x 3.2 cm on axial image # 77 Musculoskeletal: No lytic or blastic bone lesions are seen. IMPRESSION: Bibasilar pulmonary infiltrates and airway impaction most in keeping with changes of acute infection or aspiration. Given the findings below, aspiration is favored. Circumferential bowel wall thickening and focal mesenteric edema involving the proximal jejunum just beyond the ligament of Treitz suggesting an underlying infectious or inflammatory enteritis. Focal ischemia would be unusual in this location, however, given the degree of vascular disease, segmental vascular thrombosis and/or distal embolization could also result in a similar appearance. Resultant partial small bowel obstruction with fluid-filled duodenum and stomach. Nasogastric intubation may be helpful for further management. Peripheral vascular disease with extensive calcification and almost certain hemodynamically significant stenosis involving the proximal mesenteric arterial vasculature.  The degree of stenosis would be better assessed with CT arteriography, if indicated. Infiltrative change within the right inguinal region, possibly posttraumatic, infectious, or neoplastic. This could be further assessed with dedicated sonography. Aortic Atherosclerosis (ICD10-I70.0). Electronically Signed   By: Fidela Salisbury MD   On: 11/02/2020 22:18   DG Chest Port 1 View  Result Date: 11/20/2020 CLINICAL DATA:  Sepsis EXAM: PORTABLE CHEST 1 VIEW COMPARISON:  12/18/2019 FINDINGS: Cardiac shadow is stable. Aortic calcifications are again seen. Mild bibasilar atelectatic changes are noted. No focal infiltrate or sizable effusion is seen. Skin fold is noted over the lateral chest bilaterally. No bony abnormality is seen. IMPRESSION: Mild bibasilar atelectasis.  Electronically Signed   By: Inez Catalina M.D.   On: 11/11/2020 19:13     Assessment/Plan 75 year old male with a history of A. fib on Xarelto, seizure disorder on Vimpat, alcoholic cirrhosis(stopped drinking 5 years ago), CAD, COPD, HTN, unvaccinated against COVID, presenting decreased appetite x 5 days and I day of nausea and vomiting and abdominal pain and altered mental status.  Met severe sepsis criteria and tested positive for covid.    Severe sepsis/septic shock ([place]) - Patient meeting severe sepsis criteria with fever, hypotension, leukocytosis with lactic acid 4.7 altered mental status, AKI and acute respiratory failure - Septic source uncertain: COVID-positive but chest x-ray with bibasilar atelectasis only, urinalysis sterile. --  Suspecting intra-abdominal source due to vomiting and diarrhea - Continue sepsis fluid bolus started in the emergency room - Broad-spectrum antibiotics for sepsis of unknown source - CT abdomen and pelvis ordered to evaluate for intra-abdominal etiology - Neurologic checks with fall and aspiration precautions    Acute metabolic encephalopathy - Secondary to sepsis.  Treat sepsis as above - Neurologic  checks - Fall and aspiration precautions    Acute respiratory failure with hypoxia (HCC)   Possible pneumonia COVID versus non-COVID/bacterial - Suspect related to severe sepsis, -Possible pneumonia/aspiration pneumonia from vomiting though chest x-ray unremarkable - Exacerbation of COPD not evident on exam - Patient COVID-positive but chest x-ray not typical of COVID - For possible bacterial pneumonia: Continue antibiotics under sepsis of unknown source - O2 sats 95% on 3 L, was 89% on room air with EMS - Supplemental oxygen to keep sats over 92% Addendum: CT abdomen and pelvis showed bibasilar pulmonary infiltrates favoring aspiration    AKI (acute kidney injury) ([place]) - Secondary to vomiting and diarrhea as well as severe sepsis - Continue IV hydration per sepsis management - Monitor renal function and avoid nephrotoxins    Acute gastroenteritis, possibly COVID-related - Patient presents with a several hour history of vomiting, diarrhea and abdominal pain - Possibly COVID gastroenteritis - Given severe leukocytosis and lactic acidosis we will get CT abdomen to evaluate for other acute intra-abdominal pathology - Follow-up CT abdomen and pelvis - Follow lipase - Clear liquid diet - Gastrointestinal panel with enteric precautions Addendum: CT abdomen and pelvis showing circumferential bowel wall thickening and focal mesenteric edema involving the proximal jejunum just beyond the ligament of Treitzsuggesting an underlying infectious or inflammatory enteritis. Focal ischemia would be unusual in this location, however, given thedegree of vascular disease, segmental vascular thrombosis and/ordistal embolization could also result in a similar appearance.Resultant partial small bowel obstruction with fluid-filled duodenumand stomach. Nasogastric intubation may be helpful for further Management.  Partial SBO on CT abdomen and pelvis - CT abdomen resulted after admission outlined above  showing partial small bowel obstruction with fluid-filled duodenum and stomach - NG tube ordered to low intermittent suction - Patient placed n.p.o. after admission      COVID-19 virus infection - COVID PCR positive and patient had cough and shortness of breath, per wife's account but chest x-ray without typical COVID findings - Follow COVID inflammatory biomarkers - We will start remdesivir given onset of illness just 4 days prior    Hyponatremia - Serum sodium 130 - Being bolused with normal saline - Continue to monitor    Elevated troponin, likely demand ischemia   History of CAD - Troponin elevated at 91 - No prior complaints of chest pain, and EKG nonacute    Chronic atrial fibrillation (HCC)   Chronic anticoagulation - Hold rate  control agent due to hypotension on admission - Continue Xarelto    Chronic pain on chronic opiates - Holding OxyContin for now due to altered mental status - As needed short acting pain meds as needed  Hypotension with history of HTN (hypertension) - Hold home antihypertensives for now due to hypotension from severe sepsis and resume as appropriate  Alcoholic cirrhosis ([place]) - Appears stable.  No alcohol use in 5 years    COPD with chronic bronchitis ([place]) - Not acutely exacerbated - Albuterol inhaler as needed.  Will avoid nebulizers given   COVID-positive status - Possibly causing acute gastroenteritis as above, possibly incidental - No evidence of COVID-pneumonia on chest x-ray - Follow inflammatory biomarkers    Seizure disorder (HCC) - Continue Vimpat - Ativan as needed.  IV substitution for Vimpat    DVT prophylaxis: lovenox Code Status: full code, per discussion with wife Family Communication: Wife Disposition Plan: Back to previous home environment Consults called: none  Status:At the time of admission, it appears that the appropriate admission status for this patient is INPATIENT. This is judged to be reasonable and  necessary in order to provide the required intensity of service to ensure the patient's safety given the presenting symptoms, physical exam findings, and initial radiographic and laboratory data in the context of their  Comorbid conditions.   Patient requires inpatient status due to high intensity of service, high risk for further deterioration and high frequency of surveillance required.   I certify that at the point of admission it is my clinical judgment that the patient will require inpatient hospital care spanning beyond [place] MD [facility]     11/17/2020, 9:22 PM

## 2020-11-18 NOTE — Consult Note (Signed)
PHARMACY -  BRIEF ANTIBIOTIC NOTE   Pharmacy has received consult(s) for vancomycin and cefepime from an ED provider.  The patient's profile has been reviewed for ht/wt/allergies/indication/available labs.    One time order(s) placed for vancomycin 1500 mg IV x 1 and cefepime 2 g IV x 1   Further antibiotics/pharmacy consults should be ordered by admitting physician if indicated.                       Thank you,  Reatha Armour, PharmD Pharmacy Resident  11/24/2020 8:22 PM

## 2020-11-18 NOTE — ED Notes (Signed)
Pt noted to be incontinent of stool. Pt cleaned and provided with new linens, gown, and brief.

## 2020-11-18 NOTE — Consult Note (Signed)
CODE SEPSIS - PHARMACY COMMUNICATION  **Broad Spectrum Antibiotics should be administered within 1 hour of Sepsis diagnosis**  Time Code Sepsis Called/Page Received: 4401  Antibiotics Ordered: vancomycin and cefepime  Time of 1st antibiotic administration: cefepime 2 gm IV x 1 given on 1/21 @ 2036.  Additional action taken by pharmacy: Contacted EDP for abx consult/order @ 2000  If necessary, Name of Provider/Nurse Contacted: Earna Coder, [place] Clementeen Graham, PharmD Pharmacy Resident  2020-12-11 9:24 PM

## 2020-11-19 ENCOUNTER — Inpatient Hospital Stay: Payer: Medicare HMO

## 2020-11-19 DIAGNOSIS — U071 COVID-19: Secondary | ICD-10-CM | POA: Diagnosis present

## 2020-11-19 DIAGNOSIS — J9601 Acute respiratory failure with hypoxia: Secondary | ICD-10-CM

## 2020-11-19 DIAGNOSIS — A419 Sepsis, unspecified organism: Secondary | ICD-10-CM

## 2020-11-19 DIAGNOSIS — K529 Noninfective gastroenteritis and colitis, unspecified: Secondary | ICD-10-CM | POA: Diagnosis not present

## 2020-11-19 DIAGNOSIS — G9340 Encephalopathy, unspecified: Secondary | ICD-10-CM | POA: Diagnosis not present

## 2020-11-19 DIAGNOSIS — R6520 Severe sepsis without septic shock: Secondary | ICD-10-CM

## 2020-11-19 DIAGNOSIS — J1282 Pneumonia due to coronavirus disease 2019: Secondary | ICD-10-CM | POA: Diagnosis present

## 2020-11-19 DIAGNOSIS — K56600 Partial intestinal obstruction, unspecified as to cause: Secondary | ICD-10-CM | POA: Diagnosis present

## 2020-11-19 LAB — BLOOD CULTURE ID PANEL (REFLEXED) - BCID2

## 2020-11-19 LAB — CBC WITH DIFFERENTIAL/PLATELET
Abs Immature Granulocytes: 0.71 10*3/uL — ABNORMAL HIGH (ref 0.00–0.07)
Basophils Absolute: 0.1 10*3/uL (ref 0.0–0.1)
Basophils Relative: 0 %
Eosinophils Absolute: 0 10*3/uL (ref 0.0–0.5)
Eosinophils Relative: 0 %
HCT: 29.7 % — ABNORMAL LOW (ref 39.0–52.0)
Hemoglobin: 10.1 g/dL — ABNORMAL LOW (ref 13.0–17.0)
Immature Granulocytes: 4 %
Lymphocytes Relative: 5 %
Lymphs Abs: 0.9 10*3/uL (ref 0.7–4.0)
MCH: 28.9 pg (ref 26.0–34.0)
MCHC: 34 g/dL (ref 30.0–36.0)
MCV: 85.1 fL (ref 80.0–100.0)
Monocytes Absolute: 1 10*3/uL (ref 0.1–1.0)
Monocytes Relative: 6 %
Neutro Abs: 15.1 10*3/uL — ABNORMAL HIGH (ref 1.7–7.7)
Neutrophils Relative %: 85 %
Platelets: 92 10*3/uL — ABNORMAL LOW (ref 150–400)
RBC: 3.49 MIL/uL — ABNORMAL LOW (ref 4.22–5.81)
RDW: 13.4 % (ref 11.5–15.5)
Smear Review: NORMAL
WBC: 17.7 10*3/uL — ABNORMAL HIGH (ref 4.0–10.5)
nRBC: 0 % (ref 0.0–0.2)

## 2020-11-19 LAB — COMPREHENSIVE METABOLIC PANEL
ALT: 9 U/L (ref 0–44)
AST: 29 U/L (ref 15–41)
Albumin: 1.5 g/dL — ABNORMAL LOW (ref 3.5–5.0)
Alkaline Phosphatase: 45 U/L (ref 38–126)
Anion gap: 11 (ref 5–15)
BUN: 31 mg/dL — ABNORMAL HIGH (ref 8–23)
CO2: 24 mmol/L (ref 22–32)
Calcium: 6.1 mg/dL — CL (ref 8.9–10.3)
Chloride: 100 mmol/L (ref 98–111)
Creatinine, Ser: 1.7 mg/dL — ABNORMAL HIGH (ref 0.61–1.24)
GFR, Estimated: 42 mL/min — ABNORMAL LOW (ref >60)
Glucose, Bld: 121 mg/dL — ABNORMAL HIGH (ref 70–99)
Potassium: 3 mmol/L — ABNORMAL LOW (ref 3.5–5.1)
Sodium: 135 mmol/L (ref 135–145)
Total Bilirubin: 1.5 mg/dL — ABNORMAL HIGH (ref 0.3–1.2)
Total Protein: 5.8 g/dL — ABNORMAL LOW (ref 6.5–8.1)

## 2020-11-19 LAB — GASTROINTESTINAL PANEL BY PCR, STOOL (REPLACES STOOL CULTURE)

## 2020-11-19 LAB — FIBRIN DERIVATIVES D-DIMER (ARMC ONLY): Fibrin derivatives D-dimer (ARMC): 1950 ng/mL (FEU) — ABNORMAL HIGH (ref 0.00–499.00)

## 2020-11-19 LAB — PROCALCITONIN: Procalcitonin: 3 ng/mL

## 2020-11-19 LAB — C-REACTIVE PROTEIN
CRP: 14.5 mg/dL — ABNORMAL HIGH (ref <1.0)
CRP: 17.9 mg/dL — ABNORMAL HIGH (ref <1.0)

## 2020-11-19 LAB — POTASSIUM: Potassium: 3.5 mmol/L (ref 3.5–5.1)

## 2020-11-19 LAB — CORTISOL-AM, BLOOD: Cortisol - AM: 80.6 ug/dL — ABNORMAL HIGH (ref 6.7–22.6)

## 2020-11-19 MED ORDER — CALCIUM GLUCONATE-NACL 2-0.675 GM/100ML-% IV SOLN
2.0000 g | Freq: Once | INTRAVENOUS | Status: AC
  Administered 2020-11-19: 2000 mg via INTRAVENOUS
  Filled 2020-11-19: qty 100

## 2020-11-19 MED ORDER — FLEET ENEMA 7-19 GM/118ML RE ENEM: 1.0000 | ENEMA | Freq: Once | RECTAL | Status: DC

## 2020-11-19 MED ORDER — MIDODRINE HCL 5 MG PO TABS
2.5000 mg | ORAL_TABLET | Freq: Three times a day (TID) | ORAL | Status: DC
  Filled 2020-11-19 (×4): qty 0.5

## 2020-11-19 MED ORDER — SODIUM CHLORIDE 0.9 % IV SOLN
2.0000 g | Freq: Two times a day (BID) | INTRAVENOUS | Status: DC
  Administered 2020-11-19 – 2020-11-21 (×5): 2 g via INTRAVENOUS
  Filled 2020-11-19 (×7): qty 2

## 2020-11-19 MED ORDER — IPRATROPIUM-ALBUTEROL 0.5-2.5 (3) MG/3ML IN SOLN
3.0000 mL | RESPIRATORY_TRACT | Status: DC
  Administered 2020-11-19 – 2020-11-20 (×5): 3 mL via RESPIRATORY_TRACT
  Filled 2020-11-19 (×5): qty 3

## 2020-11-19 MED ORDER — POTASSIUM CHLORIDE 10 MEQ/100ML IV SOLN
10.0000 meq | INTRAVENOUS | Status: DC
Start: 2020-11-19 — End: 2020-11-19

## 2020-11-19 MED ORDER — SODIUM CHLORIDE 0.9 % IV SOLN: 2.0000 g | INTRAVENOUS | Status: DC

## 2020-11-19 MED ORDER — RISAQUAD PO CAPS
2.0000 | ORAL_CAPSULE | Freq: Every day | ORAL | Status: DC
  Filled 2020-11-19 (×2): qty 2

## 2020-11-19 MED ORDER — ENOXAPARIN SODIUM 40 MG/0.4ML ~~LOC~~ SOLN
40.0000 mg | SUBCUTANEOUS | Status: DC
  Administered 2020-11-19 – 2020-11-22 (×4): 40 mg via SUBCUTANEOUS
  Filled 2020-11-19 (×4): qty 0.4

## 2020-11-19 MED ORDER — LORAZEPAM 2 MG/ML IJ SOLN: 2.0000 mg | INTRAMUSCULAR | Status: DC | PRN

## 2020-11-19 MED ORDER — VANCOMYCIN HCL 750 MG/150ML IV SOLN
750.0000 mg | INTRAVENOUS | Status: DC
  Administered 2020-11-19 – 2020-11-20 (×2): 750 mg via INTRAVENOUS
  Filled 2020-11-19 (×3): qty 150

## 2020-11-19 MED ORDER — METHYLPREDNISOLONE SODIUM SUCC 40 MG IJ SOLR
40.0000 mg | Freq: Two times a day (BID) | INTRAMUSCULAR | Status: DC
  Administered 2020-11-20 – 2020-11-23 (×7): 40 mg via INTRAVENOUS
  Filled 2020-11-19 (×7): qty 1

## 2020-11-19 MED ORDER — LORAZEPAM 2 MG/ML IJ SOLN
0.5000 mg | Freq: Once | INTRAMUSCULAR | Status: AC
  Administered 2020-11-19: 0.5 mg via INTRAVENOUS
  Filled 2020-11-19: qty 1

## 2020-11-19 MED ORDER — POTASSIUM CHLORIDE 10 MEQ/100ML IV SOLN
10.0000 meq | INTRAVENOUS | Status: AC
Start: 2020-11-19 — End: 2020-11-19
  Administered 2020-11-19 (×3): 10 meq via INTRAVENOUS
  Filled 2020-11-19 (×3): qty 100

## 2020-11-19 MED ORDER — METHYLPREDNISOLONE SODIUM SUCC 125 MG IJ SOLR
60.0000 mg | Freq: Four times a day (QID) | INTRAMUSCULAR | Status: DC
  Administered 2020-11-19: 60 mg via INTRAVENOUS
  Filled 2020-11-19: qty 2

## 2020-11-19 MED ORDER — SODIUM CHLORIDE 0.9 % IV SOLN
50.0000 mg | Freq: Two times a day (BID) | INTRAVENOUS | Status: DC
  Administered 2020-11-19 – 2020-11-22 (×7): 50 mg via INTRAVENOUS
  Filled 2020-11-19 (×14): qty 5

## 2020-11-19 NOTE — ED Notes (Signed)
Spoke with provider.  Starting solumedrol and giving ativan to help with restlessness.

## 2020-11-19 NOTE — ED Notes (Signed)
Patient cleaned of BM and urinary incontinence. Sheets changed and chuck, brief changed.

## 2020-11-19 NOTE — ED Notes (Signed)
Lab called second time to collect blood draw.

## 2020-11-19 NOTE — Progress Notes (Signed)
Pharmacy Antibiotic Note  Stanley Nguyen is a 75 y.o. male admitted on 11/21/2020 with sepsis.  Pharmacy has been consulted for Vancomycin and Cefepime dosing.  Plan: Cefepime 2gm IV q24hrs Vancomycin 750 mg IV Q 24 hrs. Goal AUC 400-550. Expected AUC: 494.2 SCr used: 2.12, Css min predicted 14.3   Height: 5\' 10"  (177.8 cm) Weight: 68 kg (150 lb) IBW/kg (Calculated) : 73  Temp (24hrs), Avg:98.3 F (36.8 C), Min:98.1 F (36.7 C), Max:98.4 F (36.9 C)  Recent Labs  Lab 11/23/2020 1857 11/07/2020 2230  WBC 21.6*  --   CREATININE 2.12*  --   LATICACIDVEN 4.7* 2.6*    Estimated Creatinine Clearance: 29.8 mL/min (A) (by C-G formula based on SCr of 2.12 mg/dL (H)).    Allergies  Allergen Reactions  . Keppra [Levetiracetam] Rash    Antimicrobials this admission:   >>    >>   Dose adjustments this admission:   Microbiology results:  BCx:   UCx:    Sputum:    MRSA PCR:   Thank you for allowing pharmacy to be a part of this patient's care.  11/20/20 A 11/19/2020 2:05 AM

## 2020-11-19 NOTE — ED Notes (Signed)
Pt resting in bed. Bilateral side rails up, call bell within reach. Bed in low position. Pt noted to have a BM and urine in his depends. This RN, another Psychiatric nurse cleaned pt up. While cleaning, pt had an episode of vomiting and it gurgled in his mouth before coming all the way out. Pts gown was changed along with depends. Webb Silversmith, NP notified.

## 2020-11-19 NOTE — ED Notes (Signed)
Upon rounding, found pt to be soiled, linens chuck changed, pt cleansed.  Pt's hands were covered in feces, appeared to be also in his mouth.  Pt had episode of vomiting.  Mouth was cleansed as best as possible, brown paste appears to be in roof of mouth and in between teeth.  Pt oriented to self only.  Provider notified of findings.

## 2020-11-19 NOTE — ED Notes (Signed)
Pt becoming restless, VS stable as documented, appears more labored with the restlessness. Provider notified of change.

## 2020-11-19 NOTE — Progress Notes (Signed)
PROGRESS NOTE    Stanley Nguyen  VQX:[id number] DOB: [birth year] DOA: 11/22/2020 PCP: Stanley Marble, MD    Brief Narrative:   HPI: Stanley Nguyen is a 75 y.o. male with medical history significant for A. fib on Xarelto, seizure disorder on Vimpat, alcoholic cirrhosis(stopped drinking 5 years ago), CAD, COPD, HTN, unvaccinated against COVID, who was in his usual state of health until 5 days ago when he stopped eating.  Most of the history is taken from the wife over the phone due to the patient's change in mental status. He had a cough and shortness of breath  1/22-pt was confused and quiet for me this am. nsg later reported pt "ate stool possibly", vomited, could not place NGT. Now pt very restless and anxious with increase RR. cxr ordered pending.   Consultants:   surgery  Procedures: CT  Antimicrobials:   Cefepime, vancomycin, metronidazole   Subjective: Pt this am was confused and mumbling.   Objective: Vitals:   11/19/20 1400 11/19/20 1430 11/19/20 1500 11/19/20 1530  BP: 101/81 111/78 107/64 114/79  Pulse: 86 86 89 93  Resp: (!) 26 (!) 28 (!) 30 (!) 30  Temp:    98.1 F (36.7 C)  TempSrc:    Axillary  SpO2: 100% 95% 95% 98%  Weight:      Height:        Intake/Output Summary (Last 24 hours) at 11/19/2020 1618 Last data filed at 11/19/2020 1555 Gross per 24 hour  Intake 708.64 ml  Output --  Net 708.64 ml   Filed Weights   11/23/2020 1850  Weight: 68 kg    Examination:  General exam: confused, quiet. This is exam from this am Respiratory system: rhonchi, no rales Cardiovascular system: S1 & S2 heard, RRR. No gallop Gastrointestinal system: Abdomen is nondistended, soft +bs Central nervous system: confused Extremities: no edema Psychiatry: Unable to assess    Data Reviewed: I have personally reviewed following labs and imaging studies  CBC: Recent Labs  Lab 11/23/2020 1857 11/19/20 0640  WBC 21.6* 17.7*  NEUTROABS 18.9* 15.1*  HGB 11.0* 10.1*  HCT  31.6* 29.7*  MCV 84.3 85.1  PLT 119* 92*   Basic Metabolic Panel: Recent Labs  Lab 11/02/2020 1857 11/19/20 0640  NA 130* 135  K 3.1* 3.0*  CL 89* 100  CO2 24 24  GLUCOSE 162* 121*  BUN 29* 31*  CREATININE 2.12* 1.70*  CALCIUM 6.5* 6.1*   GFR: Estimated Creatinine Clearance: 37.2 mL/min (A) (by C-G formula based on SCr of 1.7 mg/dL (H)). Liver Function Tests: Recent Labs  Lab 11/21/2020 1857 11/19/20 0640  AST 47* 29  ALT 12 9  ALKPHOS 52 45  BILITOT 1.5* 1.5*  PROT 6.3* 5.8*  ALBUMIN 1.7* 1.5*   Recent Labs  Lab 11/27/2020 2230  LIPASE 19   No results for input(s): AMMONIA in the last 168 hours. Coagulation Profile: Recent Labs  Lab 11/27/2020 1857  INR 2.4*   Cardiac Enzymes: No results for input(s): CKTOTAL, CKMB, CKMBINDEX, TROPONINI in the last 168 hours. BNP (last 3 results) No results for input(s): PROBNP in the last 8760 hours. HbA1C: No results for input(s): HGBA1C in the last 72 hours. CBG: No results for input(s): GLUCAP in the last 168 hours. Lipid Profile: No results for input(s): CHOL, HDL, LDLCALC, TRIG, CHOLHDL, LDLDIRECT in the last 72 hours. Thyroid Function Tests: No results for input(s): TSH, T4TOTAL, FREET4, T3FREE, THYROIDAB in the last 72 hours. Anemia Panel: No results for input(s):  VITAMINB12, FOLATE, FERRITIN, TIBC, IRON, RETICCTPCT in the last 72 hours. Sepsis Labs: Recent Labs  Lab 11/20/2020 1857 11/21/2020 2230 11/19/20 0640  PROCALCITON  --   --  3.00  LATICACIDVEN 4.7* 2.6*  --     Recent Results (from the past 240 hour(s))  Blood Culture (routine x 2)     Status: None (Preliminary result)   Collection Time: 10/29/2020  6:57 PM   Specimen: BLOOD  Result Value Ref Range Status   Specimen Description BLOOD LEFT ANTECUBITAL  Final   Special Requests   Final    BOTTLES DRAWN AEROBIC AND ANAEROBIC Blood Culture results may not be optimal due to an inadequate volume of blood received in culture bottles   Culture   Final    NO  GROWTH < 12 HOURS Performed at [facility], [address] [place], [place] [address]    Report Status PENDING  Incomplete  Blood Culture (routine x 2)     Status: None (Preliminary result)   Collection Time: 11/17/2020  6:58 PM   Specimen: BLOOD  Result Value Ref Range Status   Specimen Description BLOOD RIGHT ANTECUBITAL  Final   Special Requests   Final    BOTTLES DRAWN AEROBIC AND ANAEROBIC Blood Culture adequate volume   Culture   Final    NO GROWTH < 12 HOURS Performed at [facility], [address] [place], [place] [address]    Report Status PENDING  Incomplete  Gastrointestinal Panel by PCR , Stool     Status: None   Collection Time: 11/19/20  6:40 AM   Specimen: Stool  Result Value Ref Range Status   Campylobacter species NOT DETECTED NOT DETECTED Final   Plesimonas shigelloides NOT DETECTED NOT DETECTED Final   Salmonella species NOT DETECTED NOT DETECTED Final   Yersinia enterocolitica NOT DETECTED NOT DETECTED Final   Vibrio species NOT DETECTED NOT DETECTED Final   Vibrio cholerae NOT DETECTED NOT DETECTED Final   Enteroaggregative E coli (EAEC) NOT DETECTED NOT DETECTED Final   Enteropathogenic E coli (EPEC) NOT DETECTED NOT DETECTED Final   Enterotoxigenic E coli (ETEC) NOT DETECTED NOT DETECTED Final   Shiga like toxin producing E coli (STEC) NOT DETECTED NOT DETECTED Final   Shigella/Enteroinvasive E coli (EIEC) NOT DETECTED NOT DETECTED Final   Cryptosporidium NOT DETECTED NOT DETECTED Final   Cyclospora cayetanensis NOT DETECTED NOT DETECTED Final   Entamoeba histolytica NOT DETECTED NOT DETECTED Final   Giardia lamblia NOT DETECTED NOT DETECTED Final   Adenovirus F40/41 NOT DETECTED NOT DETECTED Final   Astrovirus NOT DETECTED NOT DETECTED Final   Norovirus GI/GII NOT DETECTED NOT DETECTED Final   Rotavirus A NOT DETECTED NOT DETECTED Final   Sapovirus (I, II, IV, and V) NOT DETECTED NOT DETECTED Final    Comment: Performed at [facility], [address] [place], [place] [address]         Radiology Studies: CT ABDOMEN PELVIS WO CONTRAST  Result Date: 11/17/2020 CLINICAL DATA:  COVID pneumonia, generalized weakness, sepsis, vomiting, unspecified abdominal pain EXAM: CT ABDOMEN AND PELVIS WITHOUT CONTRAST TECHNIQUE: Multidetector CT imaging of the abdomen and pelvis was performed following the standard protocol without IV contrast. COMPARISON:  None. FINDINGS: Lower chest: There is asymmetric peribronchial nodularity within the visualized lung bases with airway impaction noted, best noted within the segmental and subsegmental bronchi of the left lower lobe and basilar lingula in keeping with acute infection or aspiration. Trace bilateral pleural effusions are present.  Right basilar parenchymal scarring is noted. Extensive right coronary artery calcification. Global cardiac size within normal limits. No pericardial effusion. Small gastroesophageal varices are identified. Hepatobiliary: There is hypertrophy of the left hepatic lobe and caudate lobe and the liver contour is mildly nodular in keeping with changes of cirrhosis. Recanalization of the umbilical vein is noted. No definite focal intrahepatic mass identified on this noncontrast examination. No definite intra or extrahepatic biliary ductal dilation. Gallbladder unremarkable. Pancreas: Unremarkable Spleen: Unremarkable Adrenals/Urinary Tract: The adrenal glands are unremarkable. The right kidney is markedly atrophic. The left kidney is normal in size and position and demonstrates preserved cortical thickness. There is no hydronephrosis. Extensive vascular calcifications are noted within the renal hilum. Mild nonspecific perinephric stranding is present. No loculated pararenal fluid collections identified. No ureteral calculi. The bladder is unremarkable. Stomach/Bowel: The stomach is fluid-filled, as is the duodenum. There is asymmetric mesenteric edema and circumferential  bowel wall thickening involving the proximal jejunum which appears mildly dilated suggesting changes of a a infectious or inflammatory focal enteritis with resultant partial small bowel obstruction. A discrete focal transition point is not identified. The remainder of the small bowel is unremarkable. There is large volume stool within the distal colon and rectal vault. No free intraperitoneal gas or fluid. The appendix is unremarkable. Vascular/Lymphatic: Extensive aortoiliac atherosclerotic calcification is present. Particularly prominent atherosclerotic calcification is seen within the a proximal renal arteries and mesenteric arteries with almost certain hemodynamically significant stenosis of the celiac axis and superior mesenteric artery proximally, though this is not well assessed on this noncontrast examination. Bilateral lower extremity arterial inflow stenting has been performed. Inferior vena cava filter is seen within the infrarenal cava. No pathologic adenopathy within the abdomen and pelvis. Reproductive: Prostate is unremarkable. Other: There is infiltrative change within the right inguinal region superficial to the a common femoral artery. This may represent the sequela of a recent vascular intervention with resultant hematoma, a local inflammatory process such as a developing abscess or suppurative lymph node, or an infiltrative mass. This is not well assessed on this noncontrast examination. This measures 2.4 x 3.2 cm on axial image # 77 Musculoskeletal: No lytic or blastic bone lesions are seen. IMPRESSION: Bibasilar pulmonary infiltrates and airway impaction most in keeping with changes of acute infection or aspiration. Given the findings below, aspiration is favored. Circumferential bowel wall thickening and focal mesenteric edema involving the proximal jejunum just beyond the ligament of Treitz suggesting an underlying infectious or inflammatory enteritis. Focal ischemia would be unusual in this  location, however, given the degree of vascular disease, segmental vascular thrombosis and/or distal embolization could also result in a similar appearance. Resultant partial small bowel obstruction with fluid-filled duodenum and stomach. Nasogastric intubation may be helpful for further management. Peripheral vascular disease with extensive calcification and almost certain hemodynamically significant stenosis involving the proximal mesenteric arterial vasculature. The degree of stenosis would be better assessed with CT arteriography, if indicated. Infiltrative change within the right inguinal region, possibly posttraumatic, infectious, or neoplastic. This could be further assessed with dedicated sonography. Aortic Atherosclerosis (ICD10-I70.0). Electronically Signed   By: Fidela Salisbury MD   On: 11/27/2020 22:18   DG Chest Port 1 View  Result Date: 11/19/2020 CLINICAL DATA:  Shortness of breath.  COVID positive. EXAM: PORTABLE CHEST 1 VIEW COMPARISON:  November 18, 2020 FINDINGS: The heart size and mediastinal contours are within normal limits. Patchy consolidations identified in bilateral lung bases worse compared prior exam. There are probable small bilateral pleural effusions. There  is no pulmonary edema. The visualized skeletal structures are stable. IMPRESSION: Patchy consolidations identified in bilateral lung bases worse compared prior exam, consistent with pneumonia. Electronically Signed   By: Abelardo Diesel M.D.   On: 11/19/2020 14:21   DG Chest Port 1 View  Result Date: 11/15/2020 CLINICAL DATA:  Sepsis EXAM: PORTABLE CHEST 1 VIEW COMPARISON:  12/18/2019 FINDINGS: Cardiac shadow is stable. Aortic calcifications are again seen. Mild bibasilar atelectatic changes are noted. No focal infiltrate or sizable effusion is seen. Skin fold is noted over the lateral chest bilaterally. No bony abnormality is seen. IMPRESSION: Mild bibasilar atelectasis. Electronically Signed   By: Inez Catalina M.D.   On:  10/30/2020 19:13        Scheduled Meds: . acidophilus  2 capsule Oral Daily  . albuterol  2 puff Inhalation Q6H  . enoxaparin (LOVENOX) injection  40 mg Subcutaneous Q24H  . methylPREDNISolone (SOLU-MEDROL) injection  60 mg Intravenous Q6H   Continuous Infusions: . sodium chloride 150 mL/hr at 10/30/2020 2216  . ceFEPime (MAXIPIME) IV Stopped (11/19/20 1105)  . lacosamide (VIMPAT) IV Stopped (11/19/20 1254)  . metronidazole Stopped (11/19/20 1111)  . remdesivir 100 mg in NS 100 mL Stopped (11/19/20 1321)  . vancomycin      Assessment & Plan:   Principal Problem:   Severe sepsis ([place]) Active Problems:   Pneumonia   Chronic atrial fibrillation (HCC)   Chronic pain   Peripheral vascular disease, unspecified (HCC)   HTN (hypertension)   Cirrhosis (HCC)   COPD with chronic bronchitis (HCC)   CAD (coronary artery disease)   Chronic anticoagulation   Chronic, continuous use of opioids   AKI (acute kidney injury) ([place])   Hyponatremia   Elevated troponin   Acute metabolic encephalopathy   COVID-19 virus infection   Acute respiratory failure with hypoxia (HCC)   Seizure disorder (HCC)   Acute gastroenteritis   Partial small bowel obstruction ([place])   75 year old male with a history of A. fib on Xarelto, seizure disorder on Vimpat, alcoholic cirrhosis(stopped drinking 5 years ago), CAD, COPD, HTN, unvaccinated against COVID, presenting decreased appetite x 5 days and I day of nausea and vomiting and abdominal pain and altered mental status.  Met severe sepsis criteria and tested positive for covid.    Severe sepsis/septic shock ([place]) - Patient meeting severe sepsis criteria with fever, hypotension, leukocytosis with lactic acid 4.7 altered mental status, AKI and acute respiratory failure - Septic source uncertain: COVID-positive but chest x-ray with bibasilar atelectasis only, urinalysis sterile.  Suspecting intra-abdominal source due to vomiting and diarrhea 1/22-leukocytosis  improving. Procalcitonin elevated at 3.00 Patient on broad-spectrum antibiotics with vancomycin, metronidazole, and cefepime CT abdomen pelvis ordered please see Placed on aspiration precaution Decrease IV fluid to 75 mL/h since patient is COVID+ Add midodrine low dose for low bp     Acute metabolic encephalopathy -  Secondary to sepsis  Treat sepsis Fall and aspiration precautions Neurologic checks      Acute respiratory failure with hypoxia ([place])   Possible pneumonia COVID versus non-COVID/bacterial - Suspect related to severe sepsis, -Possible pneumonia/aspiration pneumonia from vomiting though chest x-ray unremarkable 1/22-start iv steroid Continue ivabx Start iv steroids Repeat cxr as nsg noted increase RR , which likely from aspiration today with vomiting   Keep 02 support >92% pccm consult early as I am concerned if breathing does not improve may end up intubated as he is full code.       AKI (acute kidney injury) (HCC)-prerenal -  Secondary to vomiting and diarrhea as well as severe sepsis -Improving with continue IV fluids Monitor labs Avoid nephrotoxic medications and renal dose other meds     Acute gastroenteritis, possibly COVID-related - Patient presents with a several hour history of vomiting, diarrhea and abdominal pain - Possibly COVID gastroenteritis- inflammatory v.s. infectious. 1/22-see CT a/p report Support care   Partial SBO on CT abdomen and pelvis - CT abdomen resulted after admission outlined above showing partial small bowel obstruction with fluid-filled duodenum and stomach 1/22-NGT to be placed, per nsg unable to place currently with pt being difficult Npo ivf F/u with gsx      Hyponatremia - Serum sodium 130>>>135 Improved with ivf hydration     Elevated troponin, likely demand ischemia   History of CAD 1/22-TP trending down.  Will obtain echo      Chronic atrial fibrillation (HCC)   Chronic anticoagulation - Hold  rate control agent due to hypotension on admission - Continue Xarelto    Chronic pain on chronic opiates - Holding OxyContin for now due to altered mental status - As needed short acting pain meds as needed  Hypotension with history of HTN (hypertension) - Hold home antihypertensives for now due to hypotension from severe sepsis and resume as appropriate  Alcoholic cirrhosis ([place]) - Appears stable.  No alcohol use in 5 years    COPD with chronic bronchitis ([place]) - Not acutely exacerbated - Albuterol inhaler as needed.  Will avoid nebulizers given   COVID-positive status - Possibly causing acute gastroenteritis as above, possibly incidental - No evidence of COVID-pneumonia on chest x-ray - Follow inflammatory biomarkers    Seizure disorder (HCC) - Continue Vimpat - Ativan as needed.  IV substitution for Vimpat      DVT prophylaxis: lovenox Code Status:full Family Communication: updated wife, told her his px is guarded, she still wants full code as it was his wishes.   Status is: Inpatient  Remains inpatient appropriate because:IV treatments appropriate due to intensity of illness or inability to take PO   Dispo: The patient is from: Home              Anticipated d/c is to: Home              Anticipated d/c date is: > 3 days              Patient currently is not medically stable to d/c.   Difficult to place patient No            LOS: 1 day   Time spent: 35 minutes with more than 50% on [place], MD [facility] Pager [phone]  If 7PM-7AM, please contact night-coverage 11/19/2020, 4:18 PM

## 2020-11-19 NOTE — Consult Note (Addendum)
Subjective:   CC: pSBO  HPI:  Stanley Nguyen is a 75 y.o. male who was consulted by Para March for issue above.  Symptoms were first noted 2 days ago. Pain is sharp, confined to the epigastric area, without radiation.  Associated with nausea and vomiting which started shortly after the pain, exacerbated by nothing specific.  Patient has been having active diarrhea since coming into the hospital.  Per chart review patient had decreasing appetite for the past week or so.   Past Medical History:  has a past medical history of Atrial fibrillation (HCC), CAD (coronary artery disease), COPD (chronic obstructive pulmonary disease) (HCC), Dysrhythmia, Gangrene (HCC) (09/27/2017), Hypertension, Liver cirrhosis (HCC), Liver cirrhosis (HCC), Osteomyelitis (HCC) (2018), Peripheral vascular disease (HCC), Pre-diabetes, and Seizure disorder (HCC) (2015).  Past Surgical History:  Past Surgical History:  Procedure Laterality Date  . AMPUTATION TOE Left 09/27/2017   Procedure: AMPUTATION TOE-LEFT GREAT TOE, LEFT SECOND TOE;  Surgeon: Gwyneth Revels, DPM;  Location: [facility];  Service: Podiatry;  Laterality: Left;  . CARDIAC CATHETERIZATION     no stents  . ENDARTERECTOMY FEMORAL Right 07/17/2017   Procedure: ENDARTERECTOMY FEMORAL;  Surgeon: Annice Needy, MD;  Location: [facility];  Service: Vascular;  Laterality: Right;  . HERNIA REPAIR  1998   umbilical  . IRRIGATION AND DEBRIDEMENT FOOT Right 07/12/2017   Procedure: IRRIGATION AND DEBRIDEMENT FOOT;  Surgeon: Gwyneth Revels, DPM;  Location: [facility];  Service: Podiatry;  Laterality: Right;  . LOWER EXTREMITY ANGIOGRAM Right 07/17/2017   Procedure: LOWER EXTREMITY ANGIOGRAM ( FSA STENT PLACEMENT );  Surgeon: Annice Needy, MD;  Location: [facility];  Service: Vascular;  Laterality: Right;  . LOWER EXTREMITY ANGIOGRAPHY Right 07/15/2017   Procedure: Lower Extremity Angiography;  Surgeon: Annice Needy, MD;  Location: [facility];  Service: Cardiovascular;   Laterality: Right;  . LOWER EXTREMITY ANGIOGRAPHY Left 09/11/2017   Procedure: Lower Extremity Angiography;  Surgeon: Annice Needy, MD;  Location: [facility];  Service: Cardiovascular;  Laterality: Left;  . LOWER EXTREMITY ANGIOGRAPHY Left 09/13/2017   Procedure: Lower Extremity Angiography;  Surgeon: Annice Needy, MD;  Location: [facility];  Service: Cardiovascular;  Laterality: Left;  . LOWER EXTREMITY INTERVENTION  09/11/2017   Procedure: LOWER EXTREMITY INTERVENTION;  Surgeon: Annice Needy, MD;  Location: [facility];  Service: Cardiovascular;;    Family History: Reviewed and not relevant to chief complaint  Social History:  reports that he quit smoking about 3 years ago. His smoking use included cigarettes. He smoked 1.00 pack per day. He has never used smokeless tobacco. He reports that he does not drink alcohol and does not use drugs.  Current Medications:  Prior to Admission medications   Medication Sig Start Date End Date Taking? Authorizing Provider  amLODipine (NORVASC) 5 MG tablet Take 5 mg by mouth daily.   Yes [provider]  gabapentin (NEURONTIN) 300 MG capsule Take 300 mg by mouth 3 (three) times daily.   Yes [provider]  LINZESS 72 MCG capsule Take 72 mcg by mouth every morning. 10/19/20  Yes [provider]  OXYCONTIN 30 MG 12 hr tablet Hold until you follow up with your outpatient provider, since you have had an unintentional overdose event. 12/19/19  Yes Darlin Priestly, MD  sotalol (BETAPACE) 80 MG tablet Take 80 mg by mouth daily.   Yes [provider]  SPIRIVA HANDIHALER 18 MCG inhalation capsule Place 18 mcg into inhaler and  inhale daily.    Yes [provider]  VIMPAT 50 MG TABS tablet Take 50 mg by mouth 2 (two) times daily.   Yes [provider]  XARELTO 20 MG TABS tablet Take 20 mg by mouth every evening.    Yes [provider]  zolpidem (AMBIEN) 10 MG tablet Take 10 mg by  mouth at bedtime as needed. 11/17/20  Yes [provider]  albuterol (VENTOLIN HFA) 108 (90 Base) MCG/ACT inhaler Inhale into the lungs. 12/09/2020   [provider]  doxycycline (VIBRA-TABS) 100 MG tablet Take 100 mg by mouth 2 (two) times daily. 12/09/20   [provider]  nitroGLYCERIN (NITROSTAT) 0.4 MG SL tablet Place 0.4 mg under the tongue every 5 (five) minutes as needed for chest pain.    [provider]  predniSONE (DELTASONE) 20 MG tablet Take 20 mg by mouth 2 (two) times daily. 12/09/2020   [provider]    Allergies:  Allergies as of 12/09/20 - Review Complete Dec 09, 2020  Allergen Reaction Noted  . Keppra [levetiracetam] Rash 07/12/2017    ROS:  Focused review of system pertinent positives and negatives noted in the HPI.  Rest of review of systems unable to be obtained secondary to patient status.   Objective:     BP 114/79 (BP Location: Right Arm)   Pulse 93   Temp 98.1 F (36.7 C) (Axillary)   Resp (!) 30   Ht 5\' 10"  (1.778 m)   Wt 68 kg   SpO2 98%   BMI 21.52 kg/m   Constitutional :  alert and mild distress somewhat confused  Lymphatics/Throat:  no asymmetry, masses, or scars  Respiratory:  clear to auscultation bilaterally  Cardiovascular:  regular rate and rhythm  Gastrointestinal: Soft, no guarding, some tenderness to palpation in epigastric region.   Musculoskeletal: Steady movement  Skin: Cool and moist.  Right groin lesion with actively draining folliculitis  Psychiatric: Normal affect, non-agitated, not confused       LABS:  CMP Latest Ref Rng & Units 11/19/2020 December 09, 2020 12/19/2019  Glucose 70 - 99 mg/dL 12/21/2019) 601(U) 932(T)  BUN 8 - 23 mg/dL 557(D) 22(G) 15  Creatinine 0.61 - 1.24 mg/dL 25(K) 2.70(W) 2.37(S  Sodium 135 - 145 mmol/L 135 130(L) 137  Potassium 3.5 - 5.1 mmol/L 3.0(L) 3.1(L) 3.1(L)  Chloride 98 - 111 mmol/L 100 89(L) 103  CO2 22 - 32 mmol/L 24 24 24   Calcium 8.9 - 10.3 mg/dL 6.1(LL) 6.5(L)  8.3(L)  Total Protein 6.5 - 8.1 g/dL 2.83) 6.3(L) -  Total Bilirubin 0.3 - 1.2 mg/dL ) 1.5(V) -  Alkaline Phos 38 - 126 U/L 45 52 -  AST 15 - 41 U/L 29 47(H) -  ALT 0 - 44 U/L 9 12 -   CBC Latest Ref Rng & Units 11/19/2020 12/09/20 12/18/2019  WBC 4.0 - 10.5 K/uL 17.7(H) 21.6(H) 12.8(H)  Hemoglobin 13.0 - 17.0 g/dL 10.1(L) 11.0(L) 10.8(L)  Hematocrit 39.0 - 52.0 % 29.7(L) 31.6(L) 32.0(L)  Platelets 150 - 400 K/uL 92(L) 119(L) 239    RADS: CLINICAL DATA:  COVID pneumonia, generalized weakness, sepsis, vomiting, unspecified abdominal pain  EXAM: CT ABDOMEN AND PELVIS WITHOUT CONTRAST  TECHNIQUE: Multidetector CT imaging of the abdomen and pelvis was performed following the standard protocol without IV contrast.  COMPARISON:  None.  FINDINGS: Lower chest: There is asymmetric peribronchial nodularity within the visualized lung bases with airway impaction noted, best noted within the segmental and subsegmental bronchi of the left lower lobe and  basilar lingula in keeping with acute infection or aspiration. Trace bilateral pleural effusions are present. Right basilar parenchymal scarring is noted.  Extensive right coronary artery calcification. Global cardiac size within normal limits. No pericardial effusion. Small gastroesophageal varices are identified.  Hepatobiliary: There is hypertrophy of the left hepatic lobe and caudate lobe and the liver contour is mildly nodular in keeping with changes of cirrhosis. Recanalization of the umbilical vein is noted. No definite focal intrahepatic mass identified on this noncontrast examination. No definite intra or extrahepatic biliary ductal dilation. Gallbladder unremarkable.  Pancreas: Unremarkable  Spleen: Unremarkable  Adrenals/Urinary Tract: The adrenal glands are unremarkable. The right kidney is markedly atrophic. The left kidney is normal in size and position and demonstrates preserved cortical thickness.  There is no hydronephrosis. Extensive vascular calcifications are noted within the renal hilum. Mild nonspecific perinephric stranding is present. No loculated pararenal fluid collections identified. No ureteral calculi. The bladder is unremarkable.  Stomach/Bowel: The stomach is fluid-filled, as is the duodenum. There is asymmetric mesenteric edema and circumferential bowel wall thickening involving the proximal jejunum which appears mildly dilated suggesting changes of a a infectious or inflammatory focal enteritis with resultant partial small bowel obstruction. A discrete focal transition point is not identified.  The remainder of the small bowel is unremarkable. There is large volume stool within the distal colon and rectal vault. No free intraperitoneal gas or fluid. The appendix is unremarkable.  Vascular/Lymphatic: Extensive aortoiliac atherosclerotic calcification is present. Particularly prominent atherosclerotic calcification is seen within the a proximal renal arteries and mesenteric arteries with almost certain hemodynamically significant stenosis of the celiac axis and superior mesenteric artery proximally, though this is not well assessed on this noncontrast examination. Bilateral lower extremity arterial inflow stenting has been performed. Inferior vena cava filter is seen within the infrarenal cava.  No pathologic adenopathy within the abdomen and pelvis.  Reproductive: Prostate is unremarkable.  Other: There is infiltrative change within the right inguinal region superficial to the a common femoral artery. This may represent the sequela of a recent vascular intervention with resultant hematoma, a local inflammatory process such as a developing abscess or suppurative lymph node, or an infiltrative mass. This is not well assessed on this noncontrast examination. This measures 2.4 x 3.2 cm on axial image # 77  Musculoskeletal: No lytic or blastic bone  lesions are seen.  IMPRESSION: Bibasilar pulmonary infiltrates and airway impaction most in keeping with changes of acute infection or aspiration. Given the findings below, aspiration is favored.  Circumferential bowel wall thickening and focal mesenteric edema involving the proximal jejunum just beyond the ligament of Treitz suggesting an underlying infectious or inflammatory enteritis. Focal ischemia would be unusual in this location, however, given the degree of vascular disease, segmental vascular thrombosis and/or distal embolization could also result in a similar appearance. Resultant partial small bowel obstruction with fluid-filled duodenum and stomach. Nasogastric intubation may be helpful for further management.  Peripheral vascular disease with extensive calcification and almost certain hemodynamically significant stenosis involving the proximal mesenteric arterial vasculature. The degree of stenosis would be better assessed with CT arteriography, if indicated.  Infiltrative change within the right inguinal region, possibly posttraumatic, infectious, or neoplastic. This could be further assessed with dedicated sonography.  Aortic Atherosclerosis (ICD10-I70.0).   Electronically Signed   By: Helyn Numbers MD   On: 12-15-2020 22:18 Assessment:   Partial small bowel obstruction secondary to inflamed jejunum, questionable etiology  Right groin superficial skin abscess actively draining  Aspiration versus infectious pneumonia  COVID positive  Altered mental status   Plan:   Symptoms of persistent diarrhea and nausea vomiting not consistent with ischemic bowel.  No sign of melena or hematochezia at this time.  Although the patient complains of abdominal pain, he also complains of pain everywhere else on palpation so difficult to determine true severity of abdominal pain.  Current working diagnosis is COVID associated pneumonia, and N/V secondary to COVID  infection.  Increased report of GI symptoms with this variant noted.  Differential diagnosis is ischemic bowel secondary to his vasculopathy, but this typically does not present with vomiting and diarrhea.  Can consider CT angiogram once patient is more stable from COVID standpoint.    Recommend supportive care per primary team in the meantime.  NG tube placement attempted but patient was uncooperative.  Recommend retrying once patient is more cooperative.  Right groin abscess incidentally noted on CT scan and consistent on physical exam as well.  It is actively draining at this moment so we will continue to monitor for now to see if it resolves on its own.    Patient is in no state to undergo any sort of bedside procedure due to his encephalopathy, and the degree of the abscess likely is not contributing to his overall worsening clinical picture.

## 2020-11-19 NOTE — Progress Notes (Signed)
Pharmacy Antibiotic Note  Stanley Nguyen is a 75 y.o. male with PMH A. fib on Xarelto, seizure disorder on Vimpat, alcoholic cirrhosis(stopped drinking 5 years ago), CAD, COPD, HTNadmitted on 2020/11/23 with sepsis of unclear origin.  Pharmacy has been consulted for vancomycin and cefepime dosing. His Scr on admission is elevated above what appears to be his baseline level, although somewhat improved today.  Plan:  1) adjust cefepime dose to 2 grams IV every 12 hours  2) continue vancomycin 750 mg IV every 24 hours  Goal AUC 400-550.  Expected AUC: 434.0  SCr used: 1.70   Css (calculated): 26.8 / 11.9 mcg/mL  Daily SCr while on vancomycin   Levels as clinically indicated  Height: 5\' 10"  (177.8 cm) Weight: 68 kg (150 lb) IBW/kg (Calculated) : 73  Temp (24hrs), Avg:98.3 F (36.8 C), Min:98.1 F (36.7 C), Max:98.4 F (36.9 C)  Recent Labs  Lab 23-Nov-2020 [id number] 2020-11-23 2230 11/19/20 0640  WBC 21.6*  --  17.7*  CREATININE 2.12*  --  1.70*  LATICACIDVEN 4.7* 2.6*  --     Estimated Creatinine Clearance: 37.2 mL/min (A) (by C-G formula based on SCr of 1.7 mg/dL (H)).    Allergies  Allergen Reactions  . Keppra [Levetiracetam] Rash    Antimicrobials this admission: cefepime 1/21  >>  vancomycin 1/21  >>   Microbiology results: 1/21 BCx: NG < 12 hours 1/21 UCx: pending  1/22 GI panel: negative 1/21 SARS CoV-2: positive  Thank you for allowing pharmacy to be a part of this patient's care.  2/21 11/19/2020 8:21 AM

## 2020-11-19 NOTE — Consult Note (Deleted)
Subjective:   CC: pSBO  HPI:  Stanley Nguyen is a 75 y.o. male who was consulted by Para March for issue above.  Symptoms were first noted 1 day ago. Pain is sharp, entire abdomen.  Associated with nausea vomiting, exacerbated by nothing specific.  Pain started before N/V.  Mult BMs recorded since presentation to ED.  Pt also states he was having BM at home.  Exam somewhat limited due to patient inability/refusal to answer questions     Past Medical History:  has a past medical history of Atrial fibrillation (HCC), CAD (coronary artery disease), COPD (chronic obstructive pulmonary disease) (HCC), Dysrhythmia, Gangrene (HCC) (09/27/2017), Hypertension, Liver cirrhosis (HCC), Liver cirrhosis (HCC), Osteomyelitis (HCC) (2018), Peripheral vascular disease (HCC), Pre-diabetes, and Seizure disorder (HCC) (2015).  Past Surgical History:  Past Surgical History:  Procedure Laterality Date  . AMPUTATION TOE Left 09/27/2017   Procedure: AMPUTATION TOE-LEFT GREAT TOE, LEFT SECOND TOE;  Surgeon: Gwyneth Revels, DPM;  Location: [facility];  Service: Podiatry;  Laterality: Left;  . CARDIAC CATHETERIZATION     no stents  . ENDARTERECTOMY FEMORAL Right 07/17/2017   Procedure: ENDARTERECTOMY FEMORAL;  Surgeon: Annice Needy, MD;  Location: [facility];  Service: Vascular;  Laterality: Right;  . HERNIA REPAIR  1998   umbilical  . IRRIGATION AND DEBRIDEMENT FOOT Right 07/12/2017   Procedure: IRRIGATION AND DEBRIDEMENT FOOT;  Surgeon: Gwyneth Revels, DPM;  Location: [facility];  Service: Podiatry;  Laterality: Right;  . LOWER EXTREMITY ANGIOGRAM Right 07/17/2017   Procedure: LOWER EXTREMITY ANGIOGRAM ( FSA STENT PLACEMENT );  Surgeon: Annice Needy, MD;  Location: [facility];  Service: Vascular;  Laterality: Right;  . LOWER EXTREMITY ANGIOGRAPHY Right 07/15/2017   Procedure: Lower Extremity Angiography;  Surgeon: Annice Needy, MD;  Location: [facility];  Service: Cardiovascular;  Laterality: Right;  . LOWER EXTREMITY  ANGIOGRAPHY Left 09/11/2017   Procedure: Lower Extremity Angiography;  Surgeon: Annice Needy, MD;  Location: [facility];  Service: Cardiovascular;  Laterality: Left;  . LOWER EXTREMITY ANGIOGRAPHY Left 09/13/2017   Procedure: Lower Extremity Angiography;  Surgeon: Annice Needy, MD;  Location: [facility];  Service: Cardiovascular;  Laterality: Left;  . LOWER EXTREMITY INTERVENTION  09/11/2017   Procedure: LOWER EXTREMITY INTERVENTION;  Surgeon: Annice Needy, MD;  Location: [facility];  Service: Cardiovascular;;    Family History: family history is not on file.  Social History:  reports that he quit smoking about 3 years ago. His smoking use included cigarettes. He smoked 1.00 pack per day. He has never used smokeless tobacco. He reports that he does not drink alcohol and does not use drugs.  Current Medications:  Prior to Admission medications   Medication Sig Start Date End Date Taking? Authorizing Provider  albuterol (VENTOLIN HFA) 108 (90 Base) MCG/ACT inhaler Inhale into the lungs. 11/05/2020   [provider]  amLODipine (NORVASC) 5 MG tablet Take 5 mg by mouth daily.    [provider]  doxycycline (VIBRA-TABS) 100 MG tablet Take 100 mg by mouth 2 (two) times daily. 11/10/2020   [provider]  gabapentin (NEURONTIN) 300 MG capsule Take 300 mg by mouth 3 (three) times daily.    [provider]  LINZESS 72 MCG capsule Take 72 mcg by mouth every morning. 10/19/20   [provider]  nitroGLYCERIN (NITROSTAT) 0.4 MG SL tablet Place 0.4 mg under the tongue every 5 (five) minutes as needed for chest pain.  [provider]  OXYCONTIN 30 MG 12 hr tablet Hold until you follow up with your outpatient provider, since you have had an unintentional overdose event. 12/19/19   Darlin PriestlyLai, Tina, MD  predniSONE (DELTASONE) 20 MG tablet Take 20 mg by mouth 2 (two) times daily. 07-26-2021   [provider]  sotalol (BETAPACE) 80  MG tablet Take 80 mg by mouth daily.    [provider]  SPIRIVA HANDIHALER 18 MCG inhalation capsule Place 18 mcg into inhaler and inhale daily.     [provider]  VIMPAT 50 MG TABS tablet Take 50 mg by mouth 2 (two) times daily.    [provider]  XARELTO 20 MG TABS tablet Take 20 mg by mouth every evening.     [provider]  zolpidem (AMBIEN) 10 MG tablet Take 10 mg by mouth at bedtime as needed. 11/17/20   [provider]    Allergies:  Allergies as of 07-17-21 - Review Complete 07-17-21  Allergen Reaction Noted  . Keppra [levetiracetam] Rash 07/12/2017    ROS:  Unable to obtain secondary to patient not cooperating   Objective:     BP (!) 140/107   Pulse 80   Temp 98.1 F (36.7 C) (Oral)   Resp 20   Ht 5\' 10"  (1.778 m)   Wt 68 kg   SpO2 100%   BMI 21.52 kg/m   Constitutional :  alert, distracted, mild distress and uncooperative  Lymphatics/Throat:  no asymmetry, masses, or scars  Respiratory:  clear to auscultation bilaterally  Cardiovascular:  regular rate and rhythm  Gastrointestinal: soft, no guarding, some TTP on exam but distractable at times.   Musculoskeletal: Steady movement  Skin: Cool and moist in visible areas.  Groin area unable to be examined due to patient refusal   Psychiatric: Normal affect, non-agitated, slight? confused       LABS:  CMP Latest Ref Rng & Units 11-Nov-2020 12/19/2019 12/18/2019  Glucose 70 - 99 mg/dL 161(W162(H) 960(A106(H) 540(J104(H)  BUN 8 - 23 mg/dL 81(X29(H) 15 16  Creatinine 0.61 - 1.24 mg/dL 9.14(N2.12(H) 8.291.19 5.621.23  Sodium 135 - 145 mmol/L 130(L) 137 135  Potassium 3.5 - 5.1 mmol/L 3.1(L) 3.1(L) 3.6  Chloride 98 - 111 mmol/L 89(L) 103 98  CO2 22 - 32 mmol/L 24 24 28   Calcium 8.9 - 10.3 mg/dL 1.3(Y6.5(L) 8.3(L) 8.8(L)  Total Protein 6.5 - 8.1 g/dL 6.3(L) - 7.3  Total Bilirubin 0.3 - 1.2 mg/dL 8.6(V1.5(H) - 0.9  Alkaline Phos 38 - 126 U/L 52 - 65  AST 15 - 41 U/L 47(H) - 19  ALT 0 - 44 U/L 12 - 9   CBC  Latest Ref Rng & Units 11-Nov-2020 12/18/2019 09/26/2017  WBC 4.0 - 10.5 K/uL 21.6(H) 12.8(H) -  Hemoglobin 13.0 - 17.0 g/dL 11.0(L) 10.8(L) 10.5(L)  Hematocrit 39.0 - 52.0 % 31.6(L) 32.0(L) 31.5(L)  Platelets 150 - 400 K/uL 119(L) 239 -    RADS: CLINICAL DATA:  COVID pneumonia, generalized weakness, sepsis, vomiting, unspecified abdominal pain  EXAM: CT ABDOMEN AND PELVIS WITHOUT CONTRAST  TECHNIQUE: Multidetector CT imaging of the abdomen and pelvis was performed following the standard protocol without IV contrast.  COMPARISON:  None.  FINDINGS: Lower chest: There is asymmetric peribronchial nodularity within the visualized lung bases with airway impaction noted, best noted within the segmental and subsegmental bronchi of the left lower lobe and basilar lingula in keeping with acute infection or aspiration. Trace bilateral pleural effusions are present. Right basilar parenchymal  scarring is noted.  Extensive right coronary artery calcification. Global cardiac size within normal limits. No pericardial effusion. Small gastroesophageal varices are identified.  Hepatobiliary: There is hypertrophy of the left hepatic lobe and caudate lobe and the liver contour is mildly nodular in keeping with changes of cirrhosis. Recanalization of the umbilical vein is noted. No definite focal intrahepatic mass identified on this noncontrast examination. No definite intra or extrahepatic biliary ductal dilation. Gallbladder unremarkable.  Pancreas: Unremarkable  Spleen: Unremarkable  Adrenals/Urinary Tract: The adrenal glands are unremarkable. The right kidney is markedly atrophic. The left kidney is normal in size and position and demonstrates preserved cortical thickness. There is no hydronephrosis. Extensive vascular calcifications are noted within the renal hilum. Mild nonspecific perinephric stranding is present. No loculated pararenal fluid collections identified.  No ureteral calculi. The bladder is unremarkable.  Stomach/Bowel: The stomach is fluid-filled, as is the duodenum. There is asymmetric mesenteric edema and circumferential bowel wall thickening involving the proximal jejunum which appears mildly dilated suggesting changes of a a infectious or inflammatory focal enteritis with resultant partial small bowel obstruction. A discrete focal transition point is not identified.  The remainder of the small bowel is unremarkable. There is large volume stool within the distal colon and rectal vault. No free intraperitoneal gas or fluid. The appendix is unremarkable.  Vascular/Lymphatic: Extensive aortoiliac atherosclerotic calcification is present. Particularly prominent atherosclerotic calcification is seen within the a proximal renal arteries and mesenteric arteries with almost certain hemodynamically significant stenosis of the celiac axis and superior mesenteric artery proximally, though this is not well assessed on this noncontrast examination. Bilateral lower extremity arterial inflow stenting has been performed. Inferior vena cava filter is seen within the infrarenal cava.  No pathologic adenopathy within the abdomen and pelvis.  Reproductive: Prostate is unremarkable.  Other: There is infiltrative change within the right inguinal region superficial to the a common femoral artery. This may represent the sequela of a recent vascular intervention with resultant hematoma, a local inflammatory process such as a developing abscess or suppurative lymph node, or an infiltrative mass. This is not well assessed on this noncontrast examination. This measures 2.4 x 3.2 cm on axial image # 77  Musculoskeletal: No lytic or blastic bone lesions are seen.  IMPRESSION: Bibasilar pulmonary infiltrates and airway impaction most in keeping with changes of acute infection or aspiration. Given the findings below, aspiration is  favored.  Circumferential bowel wall thickening and focal mesenteric edema involving the proximal jejunum just beyond the ligament of Treitz suggesting an underlying infectious or inflammatory enteritis. Focal ischemia would be unusual in this location, however, given the degree of vascular disease, segmental vascular thrombosis and/or distal embolization could also result in a similar appearance. Resultant partial small bowel obstruction with fluid-filled duodenum and stomach. Nasogastric intubation may be helpful for further management.  Peripheral vascular disease with extensive calcification and almost certain hemodynamically significant stenosis involving the proximal mesenteric arterial vasculature. The degree of stenosis would be better assessed with CT arteriography, if indicated.  Infiltrative change within the right inguinal region, possibly posttraumatic, infectious, or neoplastic. This could be further assessed with dedicated sonography.  Aortic Atherosclerosis (ICD10-I70.0).   Electronically Signed   By: Helyn Numbers MD   On: Dec 16, 2020 22:18 Assessment:   PSBO? inflammatory vs infectious jejunal wall thickening COVID- positive Aspiration PNA ? Right groin swelling, cellulitis noted on CT?  Plan:   Pt actively still stooling, but has significant stool burden on CT, so not sure if he is decompressing  distal to this very proximal pSBO.  Still will recommend NG placement, NPO, and observation for now to see if he has significant gastric residual to ensure the pSBO has resolved.   Recommend treatment for his active COVID and monitor chronic issues per hospitalist team. PNA seen on CT, GI symptoms likely could be from this as well.

## 2020-11-19 NOTE — Consult Note (Signed)
CRITICAL CARE  CONSULT NOTE Chief Complaint: Altered mental status/Resp failure  75 year old gentleman with past medical history significant for cirrhosis, quit drinking 5 years ago, history of coronary artery disease COPD hypertension and atrial fibrillation on Xarelto, history of seizure disorders brought to the emergency room with complaints of change in mental status and refusal to eat for the last 7 to 5 days.  Most of the history is taken from from HPI done on 12/09/2020 as patient is unable to give any history.  As per wife he usually eats well but over the last 5 days he stopped eating.  He also has had cough and shortness of breath his wife offered him his nebulizer but he refused.  On the day of the admission he started vomiting and complained of abdominal pain and was brought to the emergency room in the ER EMS found him to have fever and loose stools.  His SPO2 was 89% on room air in the ER his blood pressure was 121/95 with heart rate of 88 he was tachypneic with O2 sat of 95% on 4 L he became hypotensive in the ER and was found to have leukocytosis with lactic acidosis and acute kidney injury and mildly elevated troponin.Blood work significant for leukocytosis of 21,000 with lactic acid of 4.7.  CMP revealed creatinine of 2.12, up from baseline of 1.19 as well as sodium of 130.  Mildly elevated AST of 47 and total bilirubin of 1.5.  Troponin 91.  Urinalysis without pyuria.  COVID PCR positive EKG as reviewed by me : NSR at 88 with no acute Imaging: Chest x-ray, mild bibasilar atelectasis    He was treated with IV fluids.  He was also found to have COVID PCR positive and was started on remdesivir.  CT scan of the abdomen done was worrisome for partial small bowel obstruction.  He was seen by the general surgery and advised NG tube.  However during the NG tube placement he vomited and may have aspirated and developed worsening tachypnea.  As per ER nurse he also soiled himself. I saw the patient  in the ER he appeared restless and tachypneic however intermittently open his eyes to his name calling.  His SPO2 on 6 L fluctuated in the mid to low 90s.  His blood pressure was 120/60 heart rate in the 90s.  He was restless with periods  of calmness.   Review of Systems: Unable to obtain due to altered mental status      Past Medical History:  Diagnosis Date  . Atrial fibrillation (HCC)   . CAD (coronary artery disease)   . COPD (chronic obstructive pulmonary disease) (HCC)   . Dysrhythmia    af. patient unaware of a diagnosis of a fib  . Gangrene (HCC) 09/27/2017   left foot/toes  . Hypertension   . Liver cirrhosis (HCC)   . Liver cirrhosis (HCC)   . Osteomyelitis (HCC) 2018   right ankle and foot  . Peripheral vascular disease (HCC)   . Pre-diabetes   . Seizure disorder (HCC) 2015   last seizure 2-3 years ago. takes vimpat         Past Surgical History:  Procedure Laterality Date  . AMPUTATION TOE Left 09/27/2017   Procedure: AMPUTATION TOE-LEFT GREAT TOE, LEFT SECOND TOE;  Surgeon: Gwyneth Revels, DPM;  Location: [facility];  Service: Podiatry;  Laterality: Left;  . CARDIAC CATHETERIZATION     no stents  . ENDARTERECTOMY FEMORAL Right 07/17/2017   Procedure: ENDARTERECTOMY FEMORAL;  Surgeon: Annice Needy, MD;  Location: [facility];  Service: Vascular;  Laterality: Right;  . HERNIA REPAIR  1998   umbilical  . IRRIGATION AND DEBRIDEMENT FOOT Right 07/12/2017   Procedure: IRRIGATION AND DEBRIDEMENT FOOT;  Surgeon: Gwyneth Revels, DPM;  Location: [facility];  Service: Podiatry;  Laterality: Right;  . LOWER EXTREMITY ANGIOGRAM Right 07/17/2017   Procedure: LOWER EXTREMITY ANGIOGRAM ( FSA STENT PLACEMENT );  Surgeon: Annice Needy, MD;  Location: [facility];  Service: Vascular;  Laterality: Right;  . LOWER EXTREMITY ANGIOGRAPHY Right 07/15/2017   Procedure: Lower Extremity Angiography;  Surgeon: Annice Needy, MD;  Location: [facility];  Service:  Cardiovascular;  Laterality: Right;  . LOWER EXTREMITY ANGIOGRAPHY Left 09/11/2017   Procedure: Lower Extremity Angiography;  Surgeon: Annice Needy, MD;  Location: [facility];  Service: Cardiovascular;  Laterality: Left;  . LOWER EXTREMITY ANGIOGRAPHY Left 09/13/2017   Procedure: Lower Extremity Angiography;  Surgeon: Annice Needy, MD;  Location: [facility];  Service: Cardiovascular;  Laterality: Left;  . LOWER EXTREMITY INTERVENTION  09/11/2017   Procedure: LOWER EXTREMITY INTERVENTION;  Surgeon: Annice Needy, MD;  Location: [facility];  Service: Cardiovascular;;     reports that he quit smoking about 3 years ago. His smoking use included cigarettes. He smoked 1.00 pack per day. He has never used smokeless tobacco. He reports that he does not drink alcohol and does not use drugs.        BP 114/79 (BP Location: Right Arm)   Pulse 93   Temp 98.1 F (36.7 C) (Axillary)   Resp (!) 30   Ht 5\' 10"  (1.778 m)   Wt 68 kg   SpO2 98%   BMI 21.52 kg/m    REVIEW OF SYSTEMS  PATIENT IS UNABLE TO PROVIDE COMPLETE REVIEW OF SYSTEMS DUE TO SEVERE CRITICAL ILLNESS   PHYSICAL EXAMINATION:  GENERAL:critically ill appearing, +resp distress HEAD: Normocephalic, atraumatic.  EYES: Pupils equal, round, reactive to light.  No scleral icterus.  MOUTH: Moist mucosal membrane. NECK: Supple. No thyromegaly. No nodules. No JVD.  PULMONARY: Bilateral scattered rhonchi  CARDIOVASCULAR: S1 and S2. Regular rate and rhythm. No murmurs, rubs, or gallops.  GASTROINTESTINAL: Patient thrashing around appears to have some tenderness MUSCULOSKELETAL: No swelling, clubbing, or edema.  NEUROLOGIC:restless,agiated,moves all extremeties, opens eyes to name calling,fluctuationg MS SKIN:intact,warm,dry  INTAKE/OUTPUT  Intake/Output Summary (Last 24 hours) at 11/19/2020 1748 Last data filed at 11/19/2020 1555 Gross per 24 hour  Intake 708.64 ml  Output --  Net 708.64 ml     LABS  CBC Recent Labs  Lab 11/24/2020 1857 11/19/20 0640  WBC 21.6* 17.7*  HGB 11.0* 10.1*  HCT 31.6* 29.7*  PLT 119* 92*   Coag's Recent Labs  Lab 11/20/2020 1857  APTT 49*  INR 2.4*   BMET Recent Labs  Lab 11/13/2020 1857 11/19/20 0640  NA 130* 135  K 3.1* 3.0*  CL 89* 100  CO2 24 24  BUN 29* 31*  CREATININE 2.12* 1.70*  GLUCOSE 162* 121*   Electrolytes Recent Labs  Lab 11/23/2020 1857 11/19/20 0640  CALCIUM 6.5* 6.1*   Sepsis Markers Recent Labs  Lab 11/01/2020 1857 11/06/2020 2230 11/19/20 0640  LATICACIDVEN 4.7* 2.6*  --   PROCALCITON  --   --  3.00   ABG No results for input(s): PHART, PCO2ART, PO2ART in the last 168 hours. Liver Enzymes Recent Labs  Lab 11/13/2020 1857 11/19/20 0640  AST 47* 29  ALT 12 9  ALKPHOS 52 45  BILITOT 1.5* 1.5*  ALBUMIN 1.7* 1.5*   Cardiac Enzymes No results for input(s): TROPONINI, PROBNP in the last 168 hours. Glucose No results for input(s): GLUCAP in the last 168 hours.   Recent Results (from the past 240 hour(s))  Blood Culture (routine x 2)     Status: None (Preliminary result)   Collection Time: 11/14/2020  6:57 PM   Specimen: BLOOD  Result Value Ref Range Status   Specimen Description BLOOD LEFT ANTECUBITAL  Final   Special Requests   Final    BOTTLES DRAWN AEROBIC AND ANAEROBIC Blood Culture results may not be optimal due to an inadequate volume of blood received in culture bottles   Culture   Final    NO GROWTH < 12 HOURS Performed at [facility], [address] [place], [place] [address]    Report Status PENDING  Incomplete  Blood Culture (routine x 2)     Status: None (Preliminary result)   Collection Time: 11/22/2020  6:58 PM   Specimen: BLOOD  Result Value Ref Range Status   Specimen Description BLOOD RIGHT ANTECUBITAL  Final   Special Requests   Final    BOTTLES DRAWN AEROBIC AND ANAEROBIC Blood Culture adequate volume   Culture   Final    NO GROWTH < 12 HOURS Performed at  [facility], [address] [place], [place] [address]    Report Status PENDING  Incomplete  Gastrointestinal Panel by PCR , Stool     Status: None   Collection Time: 11/19/20  6:40 AM   Specimen: Stool  Result Value Ref Range Status   Campylobacter species NOT DETECTED NOT DETECTED Final   Plesimonas shigelloides NOT DETECTED NOT DETECTED Final   Salmonella species NOT DETECTED NOT DETECTED Final   Yersinia enterocolitica NOT DETECTED NOT DETECTED Final   Vibrio species NOT DETECTED NOT DETECTED Final   Vibrio cholerae NOT DETECTED NOT DETECTED Final   Enteroaggregative E coli (EAEC) NOT DETECTED NOT DETECTED Final   Enteropathogenic E coli (EPEC) NOT DETECTED NOT DETECTED Final   Enterotoxigenic E coli (ETEC) NOT DETECTED NOT DETECTED Final   Shiga like toxin producing E coli (STEC) NOT DETECTED NOT DETECTED Final   Shigella/Enteroinvasive E coli (EIEC) NOT DETECTED NOT DETECTED Final   Cryptosporidium NOT DETECTED NOT DETECTED Final   Cyclospora cayetanensis NOT DETECTED NOT DETECTED Final   Entamoeba histolytica NOT DETECTED NOT DETECTED Final   Giardia lamblia NOT DETECTED NOT DETECTED Final   Adenovirus F40/41 NOT DETECTED NOT DETECTED Final   Astrovirus NOT DETECTED NOT DETECTED Final   Norovirus GI/GII NOT DETECTED NOT DETECTED Final   Rotavirus A NOT DETECTED NOT DETECTED Final   Sapovirus (I, II, IV, and V) NOT DETECTED NOT DETECTED Final    Comment: Performed at [facility], [address] [place], [place] [address]    MEDICATIONS   Current Facility-Administered Medications:  .  acetaminophen (TYLENOL) tablet 650 mg, 650 mg, Oral, Q6H PRN **OR** acetaminophen (TYLENOL) suppository 650 mg, 650 mg, Rectal, Q6H PRN, Lindajo Royal V, MD .  acidophilus (RISAQUAD) capsule 2 capsule, 2 capsule, Oral, Daily, Amery, Sahar, MD .  albuterol (VENTOLIN HFA) 108 (90 Base) MCG/ACT inhaler 2 puff, 2 puff, Inhalation, Q6H, Andris Baumann, MD, 2 puff at 11/19/20  1240 .  ceFEPIme (MAXIPIME) 2 g in sodium chloride 0.9 % 100 mL IVPB, 2 g, Intravenous, Q12H, Lowella Bandy, RPH, Stopped at 11/19/20 1105 .  chlorpheniramine-HYDROcodone (TUSSIONEX) 10-8 MG/5ML suspension 5 mL, 5 mL, Oral, Q12H PRN, Lindajo Royal V, MD .  enoxaparin (LOVENOX) injection 40 mg, 40 mg, Subcutaneous, Q24H, Lindajo Royal V, MD, 40 mg at 11/19/20 1037 .  guaiFENesin-dextromethorphan (ROBITUSSIN DM) 100-10 MG/5ML syrup 10 mL, 10 mL, Oral, Q4H PRN, Andris Baumann, MD .  lacosamide (VIMPAT) 50 mg in sodium chloride 0.9 % 25 mL IVPB, 50 mg, Intravenous, Q12H, Andris Baumann, MD, Stopped at 11/19/20 1254 .  LORazepam (ATIVAN) injection 2 mg, 2 mg, Intravenous, Q2H PRN, Lindajo Royal V, MD .  methylPREDNISolone sodium succinate (SOLU-MEDROL) 125 mg/2 mL injection 60 mg, 60 mg, Intravenous, Q6H, Amery, Sahar, MD, 60 mg at 11/19/20 1634 .  metroNIDAZOLE (FLAGYL) IVPB 500 mg, 500 mg, Intravenous, Q8H, Lindajo Royal V, MD, Last Rate: 100 mL/hr at 11/19/20 1640, 500 mg at 11/19/20 1640 .  midodrine (PROAMATINE) tablet 2.5 mg, 2.5 mg, Oral, TID WC, Amery, Sahar, MD .  ondansetron (ZOFRAN) tablet 4 mg, 4 mg, Oral, Q6H PRN **OR** ondansetron (ZOFRAN) injection 4 mg, 4 mg, Intravenous, Q6H PRN, Andris Baumann, MD .  [address] remdesivir 200 mg in sodium chloride 0.9% 250 mL IVPB, 200 mg, Intravenous, Once, Stopped at 11/22/2020 2252 **FOLLOWED BY** remdesivir 100 mg in sodium chloride 0.9 % 100 mL IVPB, 100 mg, Intravenous, Daily, Andris Baumann, MD, Stopped at 11/19/20 1321 .  sodium phosphate (FLEET) 7-19 GM/118ML enema 1 enema, 1 enema, Rectal, Once, Arbie Cookey, MD .  vancomycin (VANCOREADY) IVPB 750 mg/150 mL, 750 mg, Intravenous, Q24H, Hall, Scott A, RPH  Current Outpatient Medications:  .  amLODipine (NORVASC) 5 MG tablet, Take 5 mg by mouth daily., Disp: , Rfl:  .  gabapentin (NEURONTIN) 300 MG capsule, Take 300 mg by mouth 3 (three) times daily., Disp: , Rfl:  .  LINZESS 72 MCG  capsule, Take 72 mcg by mouth every morning., Disp: , Rfl:  .  OXYCONTIN 30 MG 12 hr tablet, Hold until you follow up with your outpatient provider, since you have had an unintentional overdose event., Disp: 14 tablet, Rfl:  .  sotalol (BETAPACE) 80 MG tablet, Take 80 mg by mouth daily., Disp: , Rfl:  .  SPIRIVA HANDIHALER 18 MCG inhalation capsule, Place 18 mcg into inhaler and inhale daily. , Disp: , Rfl: 1 .  VIMPAT 50 MG TABS tablet, Take 50 mg by mouth 2 (two) times daily., Disp: , Rfl: 2 .  XARELTO 20 MG TABS tablet, Take 20 mg by mouth every evening. , Disp: , Rfl: 2 .  zolpidem (AMBIEN) 10 MG tablet, Take 10 mg by mouth at bedtime as needed., Disp: , Rfl:  .  albuterol (VENTOLIN HFA) 108 (90 Base) MCG/ACT inhaler, Inhale into the lungs., Disp: , Rfl:  .  doxycycline (VIBRA-TABS) 100 MG tablet, Take 100 mg by mouth 2 (two) times daily., Disp: , Rfl:  .  nitroGLYCERIN (NITROSTAT) 0.4 MG SL tablet, Place 0.4 mg under the tongue every 5 (five) minutes as needed for chest pain., Disp: , Rfl:  .  predniSONE (DELTASONE) 20 MG tablet, Take 20 mg by mouth 2 (two) times daily., Disp: , Rfl:       Indwelling Urinary Catheter continued, requirement due to   Reason to continue Indwelling Urinary Catheter for strict Intake/Output monitoring for hemodynamic instability   Central Line continued, requirement due to   Reason to continue [employer] Monitoring of central venous pressure or other hemodynamic parameters   Ventilator continued, requirement due to, resp  failure    Ventilator Sedation RASS 0 to -2    CT abd/pelvis as per rad 1/11/29/20, images reviewed by me Bibasilar pulmonary infiltrates and airway impaction most in keeping with changes of acute infection or aspiration. Given the findings below, aspiration is favored.  Circumferential bowel wall thickening and focal mesenteric edema involving the proximal jejunum just beyond the ligament of Treitz suggesting an underlying  infectious or inflammatory enteritis. Focal ischemia would be unusual in this location, however, given the degree of vascular disease, segmental vascular thrombosis and/or distal embolization could also result in a similar appearance. Resultant partial small bowel obstruction with fluid-filled duodenum and stomach. Nasogastric intubation may be helpful for further management.  Peripheral vascular disease with extensive calcification and almost certain hemodynamically significant stenosis involving the proximal mesenteric arterial vasculature. The degree of stenosis would be better assessed with CT arteriography, if indicated.  Infiltrative change within the right inguinal region, possibly posttraumatic, infectious, or neoplastic. This could be further assessed with dedicated sonography.  Aortic Atherosclerosis (ICD10-I70.0).  ASSESSMENT AND PLAN SYNOPSIS  Severe sepsis/septic shock (HCC) -   Suspected intra-abdominal source due to vomiting and diarrhea and CT findings asa bove  - Continue IVF( boluses completed in ER earlier) LA is trending down and WBC decreased - Continue cefepime,Flagyl  and vancomycin -    Acute metabolic encephalopathy - Secondary to sepsis.   - Neurologic checks - Fall and aspiration precautions    Acute respiratory failure with hypoxia (HCC)   Possible aspiration pneumonia vs  COVID19 pneumonia - Suspect related to severe sepsis and aspiration pneumonia more likely -Chest x-ray and CT abdomen with lower lung images not classical for COVID however it certainly remains a possibility. We will continue remdesivir and add low-dose steroids.- Continue  Other antibiotics as above. Follow inflammatory markers      AKI (acute kidney injury) (HCC) better - Secondary to vomiting and diarrhea as well as severe sepsis - Continue IV hydration per sepsis management - Monitor renal function and avoid nephrotoxins  Partial SBO on CT abdomen and pelvis ??  covid related vs ischemic  - Seen by surgery and NG tube was advised.  However that resulted in vomiting and could not be placed. NPO  Gastrointestinal panel with enteric precautions ordered by Hospitalist team Consider CT angiogram once stable.           Hyponatremia - Serum sodium 130-->135 -cont IVF    Elevated troponin, likely demand ischemia   History of CAD - Troponin elevated at 91 - No prior complaints of chest pain, and EKG nonacute, follow    Chronic atrial fibrillation (HCC)   Chronic anticoagulation - Hold rate control agent due to hypotension on admission -  Xarelto ion hold   Chronic pain on chronic opiates - Hold OxyContin    Use iV narcotics as needed -  BP meds on hold  Alcoholic cirrhosis (HCC) - Appears stable.  No alcohol use in 5 years    COPD with chronic bronchitis (HCC) -Nebs q4h     Seizure disorder (HCC) - .  IV substitution for Vimpat    DVT prophylaxis: lovenox Full code as per discussion of the hospitalist with the wife .   GI/Nutrition GI PROPHYLAXIS as indicated DIET-->TF's as tolerated Constipation protocol as indicated  ENDO - ICU hypoglycemic\Hyperglycemia protocol -check FSBS per protocol      Critical Care Time devoted to patient care services described in this note is  60 minutes.   Overall, patient is critically  ill, prognosis is guarded.  Patient with Multiorgan failure and at high risk for cardiac arrest and death.   Arbie CookeyKhalid Keimya Briddell, MD  11/19/2020 5:48 PM [facility]

## 2020-11-19 NOTE — ED Notes (Signed)
Upon rounding found pt to be more tachypneic, breathing more labored.  Pt also sounds wet.  Provider notified.  Awaiting orders.

## 2020-11-19 NOTE — ED Notes (Signed)
Attempted NG tube with another RN.  Pt did not tolerate, pt was fighting the tube, became very agitated, combative, O2 dropped, and vomited brown emesis, appeared as stool.  Provider notified.

## 2020-11-19 NOTE — Progress Notes (Signed)
PHARMACY - PHYSICIAN COMMUNICATION CRITICAL VALUE ALERT - BLOOD CULTURE IDENTIFICATION (BCID)  Stanley Nguyen is an 75 y.o. male who presented to [facility] on 11/18/2020 with a chief complaint of mental status changes.  Assessment:  Blood culture with 1 of 4 bottles growing gram + cocci, BCID identified Staph epi, methicillin resistance detected  Name of physician (or Provider) ContactedAnna Genre, NP  Current antibiotics: Cefepime, Vancomycin, Metronidazole  Changes to prescribed antibiotics recommended:  Patient is on recommended antibiotics - No changes needed  Results for orders placed or performed during the hospital encounter of 11/07/2020  Blood Culture ID Panel (Reflexed) (Collected: Nov 25, 2020  6:57 PM)  Result Value Ref Range   Enterococcus faecalis NOT DETECTED NOT DETECTED   Enterococcus Faecium NOT DETECTED NOT DETECTED   Listeria monocytogenes NOT DETECTED NOT DETECTED   Staphylococcus species DETECTED (A) NOT DETECTED   Staphylococcus aureus (BCID) NOT DETECTED NOT DETECTED   Staphylococcus epidermidis DETECTED (A) NOT DETECTED   Staphylococcus lugdunensis NOT DETECTED NOT DETECTED   Streptococcus species NOT DETECTED NOT DETECTED   Streptococcus agalactiae NOT DETECTED NOT DETECTED   Streptococcus pneumoniae NOT DETECTED NOT DETECTED   Streptococcus pyogenes NOT DETECTED NOT DETECTED   A.calcoaceticus-baumannii NOT DETECTED NOT DETECTED   Bacteroides fragilis NOT DETECTED NOT DETECTED   Enterobacterales NOT DETECTED NOT DETECTED   Enterobacter cloacae complex NOT DETECTED NOT DETECTED   Escherichia coli NOT DETECTED NOT DETECTED   Klebsiella aerogenes NOT DETECTED NOT DETECTED   Klebsiella oxytoca NOT DETECTED NOT DETECTED   Klebsiella pneumoniae NOT DETECTED NOT DETECTED   Proteus species NOT DETECTED NOT DETECTED   Salmonella species NOT DETECTED NOT DETECTED   Serratia marcescens NOT DETECTED NOT DETECTED   Haemophilus influenzae NOT DETECTED NOT DETECTED   Neisseria  meningitidis NOT DETECTED NOT DETECTED   Pseudomonas aeruginosa NOT DETECTED NOT DETECTED   Stenotrophomonas maltophilia NOT DETECTED NOT DETECTED   Candida albicans NOT DETECTED NOT DETECTED   Candida auris NOT DETECTED NOT DETECTED   Candida glabrata NOT DETECTED NOT DETECTED   Candida krusei NOT DETECTED NOT DETECTED   Candida parapsilosis NOT DETECTED NOT DETECTED   Candida tropicalis NOT DETECTED NOT DETECTED   Cryptococcus neoformans/gattii NOT DETECTED NOT DETECTED   Methicillin resistance mecA/C DETECTED (A) NOT DETECTED    Clovia Cuff, PharmD, BCPS 11/19/2020 8:36 PM

## 2020-11-19 NOTE — ED Notes (Signed)
Lab called for blood draw.

## 2020-11-20 DIAGNOSIS — A419 Sepsis, unspecified organism: Secondary | ICD-10-CM | POA: Diagnosis not present

## 2020-11-20 DIAGNOSIS — R6520 Severe sepsis without septic shock: Secondary | ICD-10-CM | POA: Diagnosis not present

## 2020-11-20 LAB — CBC WITH DIFFERENTIAL/PLATELET
Abs Immature Granulocytes: 0.09 10*3/uL — ABNORMAL HIGH (ref 0.00–0.07)
Basophils Absolute: 0 10*3/uL (ref 0.0–0.1)
Basophils Relative: 0 %
Eosinophils Absolute: 0.1 10*3/uL (ref 0.0–0.5)
Eosinophils Relative: 1 %
HCT: 29.1 % — ABNORMAL LOW (ref 39.0–52.0)
Hemoglobin: 10.3 g/dL — ABNORMAL LOW (ref 13.0–17.0)
Immature Granulocytes: 1 %
Lymphocytes Relative: 6 %
Lymphs Abs: 0.9 10*3/uL (ref 0.7–4.0)
MCH: 29.5 pg (ref 26.0–34.0)
MCHC: 35.4 g/dL (ref 30.0–36.0)
MCV: 83.4 fL (ref 80.0–100.0)
Monocytes Absolute: 0.8 10*3/uL (ref 0.1–1.0)
Monocytes Relative: 5 %
Neutro Abs: 14.4 10*3/uL — ABNORMAL HIGH (ref 1.7–7.7)
Neutrophils Relative %: 87 %
Platelets: 129 10*3/uL — ABNORMAL LOW (ref 150–400)
RBC: 3.49 MIL/uL — ABNORMAL LOW (ref 4.22–5.81)
RDW: 13.6 % (ref 11.5–15.5)
Smear Review: NORMAL
WBC: 16.3 10*3/uL — ABNORMAL HIGH (ref 4.0–10.5)
nRBC: 0.1 % (ref 0.0–0.2)

## 2020-11-20 LAB — COMPREHENSIVE METABOLIC PANEL
ALT: 11 U/L (ref 0–44)
AST: 45 U/L — ABNORMAL HIGH (ref 15–41)
Albumin: 1.7 g/dL — ABNORMAL LOW (ref 3.5–5.0)
Alkaline Phosphatase: 47 U/L (ref 38–126)
Anion gap: 15 (ref 5–15)
BUN: 38 mg/dL — ABNORMAL HIGH (ref 8–23)
CO2: 21 mmol/L — ABNORMAL LOW (ref 22–32)
Calcium: 6.5 mg/dL — ABNORMAL LOW (ref 8.9–10.3)
Chloride: 105 mmol/L (ref 98–111)
Creatinine, Ser: 1.66 mg/dL — ABNORMAL HIGH (ref 0.61–1.24)
GFR, Estimated: 43 mL/min — ABNORMAL LOW (ref >60)
Glucose, Bld: 94 mg/dL (ref 70–99)
Potassium: 3.1 mmol/L — ABNORMAL LOW (ref 3.5–5.1)
Sodium: 141 mmol/L (ref 135–145)
Total Bilirubin: 2.3 mg/dL — ABNORMAL HIGH (ref 0.3–1.2)
Total Protein: 6.2 g/dL — ABNORMAL LOW (ref 6.5–8.1)

## 2020-11-20 LAB — MAGNESIUM
Magnesium: 0.8 mg/dL — CL (ref 1.7–2.4)
Magnesium: 2.1 mg/dL (ref 1.7–2.4)

## 2020-11-20 LAB — PROTIME-INR
INR: 3.2 — ABNORMAL HIGH (ref 0.8–1.2)
Prothrombin Time: 31.8 seconds — ABNORMAL HIGH (ref 11.4–15.2)

## 2020-11-20 LAB — URINE CULTURE

## 2020-11-20 LAB — PHOSPHORUS: Phosphorus: 2.3 mg/dL — ABNORMAL LOW (ref 2.5–4.6)

## 2020-11-20 LAB — MRSA PCR SCREENING: MRSA by PCR: POSITIVE — AB

## 2020-11-20 LAB — FIBRIN DERIVATIVES D-DIMER (ARMC ONLY): Fibrin derivatives D-dimer (ARMC): 5039.75 ng/mL (FEU) — ABNORMAL HIGH (ref 0.00–499.00)

## 2020-11-20 LAB — GLUCOSE, CAPILLARY: Glucose-Capillary: 101 mg/dL — ABNORMAL HIGH (ref 70–99)

## 2020-11-20 LAB — C-REACTIVE PROTEIN: CRP: 19.8 mg/dL — ABNORMAL HIGH (ref <1.0)

## 2020-11-20 MED ORDER — VITAMIN K1 10 MG/ML IJ SOLN
10.0000 mg | Freq: Once | INTRAVENOUS | Status: AC
  Administered 2020-11-20: 10 mg via INTRAVENOUS
  Filled 2020-11-20: qty 1

## 2020-11-20 MED ORDER — DEXTROSE IN LACTATED RINGERS 5 % IV SOLN: INTRAVENOUS | Status: DC

## 2020-11-20 MED ORDER — POTASSIUM CHLORIDE 10 MEQ/100ML IV SOLN
INTRAVENOUS | Status: AC
  Administered 2020-11-20: 10 meq via INTRAVENOUS
  Filled 2020-11-20: qty 100

## 2020-11-20 MED ORDER — MAGNESIUM SULFATE 4 GM/100ML IV SOLN
4.0000 g | Freq: Once | INTRAVENOUS | Status: DC
  Filled 2020-11-20: qty 100

## 2020-11-20 MED ORDER — MUPIROCIN 2 % EX OINT
1.0000 "application " | TOPICAL_OINTMENT | Freq: Two times a day (BID) | CUTANEOUS | Status: DC
  Administered 2020-11-20 – 2020-11-22 (×5): 1 via NASAL
  Filled 2020-11-20: qty 22

## 2020-11-20 MED ORDER — MAGNESIUM SULFATE 2 GM/50ML IV SOLN: 2.0000 g | Freq: Once | INTRAVENOUS | Status: DC

## 2020-11-20 MED ORDER — CHLORHEXIDINE GLUCONATE CLOTH 2 % EX PADS
6.0000 | MEDICATED_PAD | Freq: Every day | CUTANEOUS | Status: DC
  Administered 2020-11-20 – 2020-11-21 (×2): 6 via TOPICAL

## 2020-11-20 MED ORDER — POTASSIUM PHOSPHATES 15 MMOLE/5ML IV SOLN
15.0000 mmol | Freq: Once | INTRAVENOUS | Status: AC
  Administered 2020-11-20: 15 mmol via INTRAVENOUS
  Filled 2020-11-20: qty 5

## 2020-11-20 MED ORDER — POTASSIUM CHLORIDE 10 MEQ/100ML IV SOLN
10.0000 meq | INTRAVENOUS | Status: AC
  Administered 2020-11-20 (×3): 10 meq via INTRAVENOUS
  Filled 2020-11-20 (×3): qty 100

## 2020-11-20 MED ORDER — MAGNESIUM SULFATE 2 GM/50ML IV SOLN
2.0000 g | INTRAVENOUS | Status: AC
  Administered 2020-11-20 (×3): 2 g via INTRAVENOUS
  Filled 2020-11-20 (×3): qty 50

## 2020-11-20 NOTE — Progress Notes (Addendum)
Subjective:  CC: Stanley Nguyen is a 75 y.o. male  Hospital stay day 2,   pSBO  HPI: Couple more verbal reports of BMs, still confused.   ROS:  Unable to obtain secondary to patient refusal to answer questions  Objective:   Temp:  [98 F (36.7 C)] 98 F (36.7 C) (01/23 0533) Pulse Rate:  [70-106] 87 (01/23 1347) Resp:  [18-38] 31 (01/23 1347) BP: (81-146)/(57-98) 128/87 (01/23 1347) SpO2:  [91 %-100 %] 98 % (01/23 1347)     Height: 5\' 10"  (177.8 cm) Weight: 68 kg BMI (Calculated): 21.52   Intake/Output this shift:   Intake/Output Summary (Last 24 hours) at 11/20/2020 1602 Last data filed at 11/20/2020 1347 Gross per 24 hour  Intake 1021.58 ml  Output -  Net 1021.58 ml    Constitutional :  mild distress and uncooperative  Respiratory:  clear to auscultation bilaterally  Cardiovascular:  regular rate and rhythm  Gastrointestinal: soft, non-tender; bowel sounds normal; no masses,  no organomegaly.   Skin: Cool and moist.   Psychiatric: Confused, in fetal position an unable to answer coherently       LABS:  CMP Latest Ref Rng & Units 11/20/2020 11/19/2020 11/19/2020  Glucose 70 - 99 mg/dL 94 - 11/21/2020)  BUN 8 - 23 mg/dL 092(Z) - 30(Q)  Creatinine 0.61 - 1.24 mg/dL 76(A) - 2.63(F)  Sodium 135 - 145 mmol/L 141 - 135  Potassium 3.5 - 5.1 mmol/L 3.1(L) 3.5 3.0(L)  Chloride 98 - 111 mmol/L 105 - 100  CO2 22 - 32 mmol/L 21(L) - 24  Calcium 8.9 - 10.3 mg/dL 3.54(T) - 6.1(LL)  Total Protein 6.5 - 8.1 g/dL 6.2(L) - 5.8(L)  Total Bilirubin 0.3 - 1.2 mg/dL 2.3(H) - 1.5(H)  Alkaline Phos 38 - 126 U/L 47 - 45  AST 15 - 41 U/L 45(H) - 29  ALT 0 - 44 U/L 11 - 9   CBC Latest Ref Rng & Units 11/20/2020 11/19/2020 11/01/2020  WBC 4.0 - 10.5 K/uL 16.3(H) 17.7(H) 21.6(H)  Hemoglobin 13.0 - 17.0 g/dL 10.3(L) 10.1(L) 11.0(L)  Hematocrit 39.0 - 52.0 % 29.1(L) 29.7(L) 31.6(L)  Platelets 150 - 400 K/uL 129(L) 92(L) 119(L)    RADS: n/a Assessment:   PSBO.  More episodes of BM reported, no  reported further N/V.  Respiratory status still tenuous with labored breathing and tachypnea noted on exam.  Still will recommend holding off on starting any oral feeds due to respiratory status as well as continued confusion.  Can still consider CT angiogram if worsening abdominal pain or persistent N/V, but again, N/V/D with no obvious complaints of abdominal pain is not consistent with acute ischemic bowel.  Right groin area boil unable to be assess due to patient noncompliance.  Will try again later.  Further mangement per medical team for now.  Surgery will continue to follow

## 2020-11-20 NOTE — ED Notes (Signed)
This RN to the room. Pt has urinated all over the bed and floor. Pt still responsive to painful stimuli. Pt is alert but very confused. Tried adjusting pt's cardiac leads but pt combative and kicking. Urine cleaned up on the floor.

## 2020-11-20 NOTE — Progress Notes (Signed)
CRITICAL CARE NOTE  CC  follow up respiratory failure Sepsis Partial SBO Covid 19  MSSE bacteremia   SUBJECTIVE Patient remains critically ill Prognosis is guarded He is confused, restless ,agitated and is unable to provide any history     SIGNIFICANT EVENTS    BP 139/85   Pulse 83   Temp (!) 95.6 F (35.3 C) (Axillary)   Resp 18   Ht 5\' 10"  (1.778 m)   Wt 68 kg   SpO2 97%   BMI 21.52 kg/m    REVIEW OF SYSTEMS  PATIENT IS UNABLE TO PROVIDE COMPLETE REVIEW OF SYSTEMS DUE TO SEVERE CRITICAL ILLNESS   PHYSICAL EXAMINATION:  GENERAL:critically ill appearing, no resp distress HEAD: Normocephalic, atraumatic.  EYES: Pupils equal, round, reactive to light.  No scleral icterus.  MOUTH: Moist mucosal membrane. NECK: Supple. No thyromegaly. No nodules. No JVD.  PULMONARY: Few rhonchi no significant wheezing  CARDIOVASCULAR: S1 and S2. Regular rate and rhythm. No murmurs, rubs, or gallops.  GASTROINTESTINAL: Soft, nontender, -distended. No masses. Positive bowel sounds. No hepatosplenomegaly.  MUSCULOSKELETAL: No swelling, clubbing, or edema.  NEUROLOGIC: Confused restless and agitated moves all 4 extremities. Opened eyes to name calling and nodded yes to when asked if hungry   SKIN:intact,warm,dry  INTAKE/OUTPUT  Intake/Output Summary (Last 24 hours) at 11/20/2020 1707 Last data filed at 11/20/2020 1347 Gross per 24 hour  Intake 1021.58 ml  Output --  Net 1021.58 ml    LABS  CBC Recent Labs  Lab 11-21-2020 1857 11/19/20 0640 11/20/20 0414  WBC 21.6* 17.7* 16.3*  HGB 11.0* 10.1* 10.3*  HCT 31.6* 29.7* 29.1*  PLT 119* 92* 129*   Coag's Recent Labs  Lab 21-Nov-2020 1857  APTT 49*  INR 2.4*   BMET Recent Labs  Lab 11-21-20 1857 11/19/20 0640 11/19/20 1805 11/20/20 0414  NA 130* 135  --  141  K 3.1* 3.0* 3.5 3.1*  CL 89* 100  --  105  CO2 24 24  --  21*  BUN 29* 31*  --  38*  CREATININE 2.12* 1.70*  --  1.66*  GLUCOSE 162* 121*  --  94    Electrolytes Recent Labs  Lab 11-21-20 1857 11/19/20 0640 11/20/20 0414 11/20/20 1551  CALCIUM 6.5* 6.1* 6.5*  --   MG  --   --  0.8* 2.1  PHOS  --   --  2.3*  --    Sepsis Markers Recent Labs  Lab 11/21/20 1857 2020-11-21 2230 11/19/20 0640  LATICACIDVEN 4.7* 2.6*  --   PROCALCITON  --   --  3.00   ABG No results for input(s): PHART, PCO2ART, PO2ART in the last 168 hours. Liver Enzymes Recent Labs  Lab 2020-11-21 1857 11/19/20 0640 11/20/20 0414  AST 47* 29 45*  ALT 12 9 11   ALKPHOS 52 45 47  BILITOT 1.5* 1.5* 2.3*  ALBUMIN 1.7* 1.5* 1.7*   Cardiac Enzymes No results for input(s): TROPONINI, PROBNP in the last 168 hours. Glucose Recent Labs  Lab 11/20/20 1436  GLUCAP 101*     Recent Results (from the past 240 hour(s))  Blood Culture (routine x 2)     Status: None (Preliminary result)   Collection Time: Nov 21, 2020  6:57 PM   Specimen: BLOOD  Result Value Ref Range Status   Specimen Description BLOOD LEFT ANTECUBITAL  Final   Special Requests   Final    BOTTLES DRAWN AEROBIC AND ANAEROBIC Blood Culture results may not be optimal due to an inadequate volume of  blood received in culture bottles   Culture  Setup Time   Final    Organism ID to follow GRAM POSITIVE COCCI IN BOTH AEROBIC AND ANAEROBIC BOTTLES CRITICAL RESULT CALLED TO, READ BACK BY AND VERIFIED WITH: LISA KLUTZ AT 1903 11/19/20.MF Performed at [facility] [place], [address] [place], [place] [address]    Culture [facility] POSITIVE COCCI  Final   Report Status PENDING  Incomplete  Blood Culture ID Panel (Reflexed)     Status: Abnormal   Collection Time: 11/24/2020  6:57 PM  Result Value Ref Range Status   Enterococcus faecalis NOT DETECTED NOT DETECTED Final   Enterococcus Faecium NOT DETECTED NOT DETECTED Final   Listeria monocytogenes NOT DETECTED NOT DETECTED Final   Staphylococcus species DETECTED (A) NOT DETECTED Final    Comment: CRITICAL RESULT CALLED TO, READ BACK BY AND VERIFIED  WITH: LISA Sedgwick AT 1903 11/19/20. MF    Staphylococcus aureus (BCID) NOT DETECTED NOT DETECTED Final   Staphylococcus epidermidis DETECTED (A) NOT DETECTED Final    Comment: Methicillin (oxacillin) resistant coagulase negative staphylococcus. Possible blood culture contaminant (unless isolated from more than one blood culture draw or clinical case suggests pathogenicity). No antibiotic treatment is indicated for blood  culture contaminants. CRITICAL RESULT CALLED TO, READ BACK BY AND VERIFIED WITH:  LISA Lauver AT 1903 11/19/20. MF    Staphylococcus lugdunensis NOT DETECTED NOT DETECTED Final   Streptococcus species NOT DETECTED NOT DETECTED Final   Streptococcus agalactiae NOT DETECTED NOT DETECTED Final   Streptococcus pneumoniae NOT DETECTED NOT DETECTED Final   Streptococcus pyogenes NOT DETECTED NOT DETECTED Final   A.calcoaceticus-baumannii NOT DETECTED NOT DETECTED Final   Bacteroides fragilis NOT DETECTED NOT DETECTED Final   Enterobacterales NOT DETECTED NOT DETECTED Final   Enterobacter cloacae complex NOT DETECTED NOT DETECTED Final   Escherichia coli NOT DETECTED NOT DETECTED Final   Klebsiella aerogenes NOT DETECTED NOT DETECTED Final   Klebsiella oxytoca NOT DETECTED NOT DETECTED Final   Klebsiella pneumoniae NOT DETECTED NOT DETECTED Final   Proteus species NOT DETECTED NOT DETECTED Final   Salmonella species NOT DETECTED NOT DETECTED Final   Serratia marcescens NOT DETECTED NOT DETECTED Final   Haemophilus influenzae NOT DETECTED NOT DETECTED Final   Neisseria meningitidis NOT DETECTED NOT DETECTED Final   Pseudomonas aeruginosa NOT DETECTED NOT DETECTED Final   Stenotrophomonas maltophilia NOT DETECTED NOT DETECTED Final   Candida albicans NOT DETECTED NOT DETECTED Final   Candida auris NOT DETECTED NOT DETECTED Final   Candida glabrata NOT DETECTED NOT DETECTED Final   Candida krusei NOT DETECTED NOT DETECTED Final   Candida parapsilosis NOT DETECTED NOT DETECTED Final    Candida tropicalis NOT DETECTED NOT DETECTED Final   Cryptococcus neoformans/gattii NOT DETECTED NOT DETECTED Final   Methicillin resistance mecA/C DETECTED (A) NOT DETECTED Final    Comment: CRITICAL RESULT CALLED TO, READ BACK BY AND VERIFIED WITH: LISA Golliday AT 1903 11/19/20. MF Performed at [facility], [address] [place], [place] [address]   Blood Culture (routine x 2)     Status: None (Preliminary result)   Collection Time: 11/20/2020  6:58 PM   Specimen: BLOOD  Result Value Ref Range Status   Specimen Description BLOOD RIGHT ANTECUBITAL  Final   Special Requests   Final    BOTTLES DRAWN AEROBIC AND ANAEROBIC Blood Culture adequate volume   Culture  Setup Time   Final    GRAM POSITIVE COCCI IN BOTH AEROBIC AND ANAEROBIC BOTTLES CRITICAL  VALUE NOTED.  VALUE IS CONSISTENT WITH PREVIOUSLY REPORTED AND CALLED VALUE. Performed at [facility], [address] [place], [place] [address]    [facility]  Final   Report Status PENDING  Incomplete  Urine culture     Status: Abnormal   Collection Time: 11/24/2020  6:58 PM   Specimen: Urine, Random  Result Value Ref Range Status   Specimen Description   Final    URINE, RANDOM Performed at [facility], [address] [place], [place] [address]    Special Requests   Final    NONE Performed at [facility], [address] [place], [place] [address]    Culture MULTIPLE SPECIES PRESENT, SUGGEST RECOLLECTION (A)  Final   Report Status 11/20/2020 FINAL  Final  Gastrointestinal Panel by PCR , Stool     Status: None   Collection Time: 11/19/20  6:40 AM   Specimen: Stool  Result Value Ref Range Status   Campylobacter species NOT DETECTED NOT DETECTED Final   Plesimonas shigelloides NOT DETECTED NOT DETECTED Final   Salmonella species NOT DETECTED NOT DETECTED Final   Yersinia enterocolitica NOT DETECTED NOT DETECTED Final   Vibrio species NOT DETECTED NOT DETECTED Final    Vibrio cholerae NOT DETECTED NOT DETECTED Final   Enteroaggregative E coli (EAEC) NOT DETECTED NOT DETECTED Final   Enteropathogenic E coli (EPEC) NOT DETECTED NOT DETECTED Final   Enterotoxigenic E coli (ETEC) NOT DETECTED NOT DETECTED Final   Shiga like toxin producing E coli (STEC) NOT DETECTED NOT DETECTED Final   Shigella/Enteroinvasive E coli (EIEC) NOT DETECTED NOT DETECTED Final   Cryptosporidium NOT DETECTED NOT DETECTED Final   Cyclospora cayetanensis NOT DETECTED NOT DETECTED Final   Entamoeba histolytica NOT DETECTED NOT DETECTED Final   Giardia lamblia NOT DETECTED NOT DETECTED Final   Adenovirus F40/41 NOT DETECTED NOT DETECTED Final   Astrovirus NOT DETECTED NOT DETECTED Final   Norovirus GI/GII NOT DETECTED NOT DETECTED Final   Rotavirus A NOT DETECTED NOT DETECTED Final   Sapovirus (I, II, IV, and V) NOT DETECTED NOT DETECTED Final    Comment: Performed at [facility], [address] [place], [place] [address]    MEDICATIONS   Current Facility-Administered Medications:  .  acetaminophen (TYLENOL) tablet 650 mg, 650 mg, Oral, Q6H PRN **OR** acetaminophen (TYLENOL) suppository 650 mg, 650 mg, Rectal, Q6H PRN, Lindajo Royaluncan, Hazel V, MD .  acidophilus (RISAQUAD) capsule 2 capsule, 2 capsule, Oral, Daily, Amery, Sahar, MD .  ceFEPIme (MAXIPIME) 2 g in sodium chloride 0.9 % 100 mL IVPB, 2 g, Intravenous, Q12H, Lowella BandyGrubb, Rodney D, RPH, Stopped at 11/20/20 1123 .  Chlorhexidine Gluconate Cloth 2 % PADS 6 each, 6 each, Topical, Daily, Lynn ItoAmery, Sahar, MD, 6 each at 11/20/20 1430 .  chlorpheniramine-HYDROcodone (TUSSIONEX) 10-8 MG/5ML suspension 5 mL, 5 mL, Oral, Q12H PRN, Lindajo Royaluncan, Hazel V, MD .  enoxaparin (LOVENOX) injection 40 mg, 40 mg, Subcutaneous, Q24H, Lindajo Royaluncan, Hazel V, MD, 40 mg at 11/20/20 1032 .  guaiFENesin-dextromethorphan (ROBITUSSIN DM) 100-10 MG/5ML syrup 10 mL, 10 mL, Oral, Q4H PRN, Lindajo Royaluncan, Hazel V, MD .  ipratropium-albuterol (DUONEB) 0.5-2.5 (3) MG/3ML nebulizer  solution 3 mL, 3 mL, Nebulization, Q4H, Arbie CookeyBashir, Zoa Dowty, MD, 3 mL at 11/20/20 1227 .  lacosamide (VIMPAT) 50 mg in sodium chloride 0.9 % 25 mL IVPB, 50 mg, Intravenous, Q12H, Andris Baumannuncan, Hazel V, MD, Stopped at 11/20/20 1203 .  LORazepam (ATIVAN) injection 2 mg, 2 mg, Intravenous, Q2H PRN, Andris Baumannuncan, Hazel V,  MD .  methylPREDNISolone sodium succinate (SOLU-MEDROL) 40 mg/mL injection 40 mg, 40 mg, Intravenous, Q12H, Arbie Cookey, MD, 40 mg at 11/20/20 0530 .  metroNIDAZOLE (FLAGYL) IVPB 500 mg, 500 mg, Intravenous, Q8H, Lindajo Royal V, MD, Last Rate: 100 mL/hr at 11/20/20 1557, 500 mg at 11/20/20 1557 .  midodrine (PROAMATINE) tablet 2.5 mg, 2.5 mg, Oral, TID WC, Amery, Sahar, MD .  ondansetron (ZOFRAN) tablet 4 mg, 4 mg, Oral, Q6H PRN **OR** ondansetron (ZOFRAN) injection 4 mg, 4 mg, Intravenous, Q6H PRN, Andris Baumann, MD .  [address] remdesivir 200 mg in sodium chloride 0.9% 250 mL IVPB, 200 mg, Intravenous, Once, Stopped at 11/23/2020 2252 **FOLLOWED BY** remdesivir 100 mg in sodium chloride 0.9 % 100 mL IVPB, 100 mg, Intravenous, Daily, Andris Baumann, MD, Stopped at 11/20/20 1203 .  sodium phosphate (FLEET) 7-19 GM/118ML enema 1 enema, 1 enema, Rectal, Once, Arbie Cookey, MD .  vancomycin (VANCOREADY) IVPB 750 mg/150 mL, 750 mg, Intravenous, Q24H, Wayland Denis, RPH, Stopped at 11/19/20 2008      Indwelling Urinary Catheter continued, requirement due to   Reason to continue Indwelling Urinary Catheter for strict Intake/Output monitoring for hemodynamic instability   Central Line continued, requirement due to   Reason to continue [employer] Monitoring of central venous pressure or other hemodynamic parameters   Ventilator continued, requirement due to, resp failure    Ventilator Sedation RASS 0 to -2     ASSESSMENT AND PLAN SYNOPSIS Severe sepsis/septic shock(HCC) - Suspected intra-abdominal source due to vomiting and diarrhea and CT findings as above  -Continue IVF(  boluses completed in ER earlier) LA is trending down and WBC decreased - Continue cefepime,Flagyl  and vancomycin Blood c/s Staph epi(MRSE) will repeat blood c/s ID conulted d/w Dr Daiva Eves, plan to be seen tomorrow. Cont present abx. Urine c/s repeated - Acute metabolic encephalopathy -Secondary to sepsis.  -Neurologic checks -Fall and aspiration precautions  seems to be better than yesterday   Acute respiratory failure with hypoxia (HCC) Possible aspiration pneumonia vs  COVID19 pneumonia -Suspect related to severe sepsis and aspiration pneumonia more likely -Chest x-ray and CT abdomen with lower lung images not classical for COVID however it certainly remains a possibility. We will continue remdesivir and add low-dose steroids.- Continue  Other antibiotics as above. Follow inflammatory markers   AKI (acute kidney injury) (HCC) better -Secondary to vomiting and diarrhea as well as severe sepsis -Continue IV hydration per sepsis management -Monitor renal function and avoid nephrotoxins Renal fnx better today Foley placed  Partial SBO on CT abdomen and pelvis ?? covid related vs ischemic  - Seen by surgery and NG tube was advised.  However that resulted in vomiting and could not be placed. NPO Gastrointestinal panel with enteric precautions ordered by Hospitalist team Consider CT angiogram once stable.  ?DIC elevated d dimer and INR , plt have improved. Will give vit K. Hospitalist plans to consult hematology  Hyponatremia -Serum sodium 130-->135-->141 -cont IVF  Elevated troponin, likely demand ischemia History of CAD -Troponin elevated at 91 -No prior complaints of chest pain, and EKG nonacute, follow  Chronic atrial fibrillation (HCC) Chronic anticoagulation -Hold rate control agent due to hypotension on admission - Xarelto ion hold  Chronic painon chronic opiates -Hold OxyContin    Use iV narcotics as  needed - BP meds on hold  Alcoholic cirrhosis (HCC) -Appears stable. No alcohol use in 5 years  COPD with chronic bronchitis (HCC) -Nebs q4h   Seizure disorder (HCC) -.  IV substitution for Vimpat   DVT prophylaxis:lovenox Full code as per discussion of the hospitalist with the wife .   GI/Nutrition GI PROPHYLAXIS as indicated DIET-->TF's as tolerated Constipation protocol as indicated  ENDO - ICU hypoglycemic\Hyperglycemia protocol -check FSBS per protocol      Critical Care Time devoted to patient care services described in this note is 35  minutes.   Overall, patient is critically ill, prognosis is guarded.  Patient with Multiorgan failure and at high risk for cardiac arrest and death.   Arbie CookeyKhalid Deazia Lampi, MD  11/20/2020 5:07 PM [facility]

## 2020-11-20 NOTE — ED Notes (Signed)
Advised nurse that patient has assigned bed 

## 2020-11-20 NOTE — Progress Notes (Addendum)
      INFECTIOUS DISEASE ATTENDING ADDENDUM:   Date: 11/20/2020  Patient name: Stanley Nguyen  Medical record number: [id number]  Date of birth: 12/20/1945   Called by Dr. Allena Napoleon re this patient who has hx of atrial fibrillation, on Xarelto seizure disorder alcoholic cirrhosis coronary disease, not vaccinated against COVID-19 admitted with COVID-19 infection and sepsis.  He has had CT scan which shows bibasilar infiltrates in the lungs and some circumferential bowel wall thickening and evidence of mesenteric edema  His blood cultures have grown Staph epidermidis in 2 out of 2 sites on the left and right antecubital fossa.  I would certainly keep him on vancomycin for his coagulase-negative Staphylococcus infection.  I agree with repeat blood cultures.  Would look to make sure he does not have any foci on the skin that are infected given that this is a skin bacteria.  With his cirrhosis he is liable to get infection at the slightest opportunity and having COVID-19 infection certainly puts him at risk for infection via his airway as well.  I would also leave him on cefepime and Flagyl given the concerns for possible infection in the abdomen.  Dr. Rivka Safer will see the patient tomorrow in person   Acey Lav 11/20/2020, 5:33 PM

## 2020-11-20 NOTE — Progress Notes (Addendum)
PROGRESS NOTE    Stanley Nguyen  IEP:[id number] DOB: April 08, 1946 DOA: 11/24/2020 PCP: Jodi Marble, MD    Brief Narrative:   HPI: Stanley Nguyen is a 75 y.o. male with medical history significant for A. fib on Xarelto, seizure disorder on Vimpat, alcoholic cirrhosis(stopped drinking 5 years ago), CAD, COPD, HTN, unvaccinated against COVID, who was in his usual state of health until 5 days ago when he stopped eating.  Most of the history is taken from the wife over the phone due to the patient's change in mental status. He had a cough and shortness of breath. CT scan of the abdomen done was worrisome for partial small bowel obstruction.  He was seen by the general surgery and advised NG tube.  However during the NG tube placement he vomited and may have aspirated and developed worsening tachypnea.  1/22-pt was confused and quiet for me this am. nsg later reported pt "ate stool possibly", vomited, could not place NGT. Now pt very restless and anxious with increase RR. cxr ordered pending.  1/23-patient confused this AM.  Seen in ER.  Moans when I call his name.  He has urinated on himself.  Per nursing they tried to change him however he was somewhat combative.  Consultants:   surgery,.  Critical care  Procedures:  CT abdomen and pelvis 11/01/2020 Bibasilar pulmonary infiltrates and airway impaction most in keeping with changes of acute infection or aspiration. Given the findings below, aspiration is favored.  Circumferential bowel wall thickening and focal mesenteric edema involving the proximal jejunum just beyond the ligament of Treitz suggesting an underlying infectious or inflammatory enteritis. Focal ischemia would be unusual in this location, however, given the degree of vascular disease, segmental vascular thrombosis and/or distal embolization could also result in a similar appearance. Resultant partial small bowel obstruction with fluid-filled duodenum and stomach. Nasogastric  intubation may be helpful for further management.  Peripheral vascular disease with extensive calcification and almost certain hemodynamically significant stenosis involving the proximal mesenteric arterial vasculature. The degree of stenosis would be better assessed with CT arteriography, if indicated.  Infiltrative change within the right inguinal region, possibly posttraumatic, infectious, or neoplastic. This could be further assessed with dedicated sonography.  Aortic Atherosclerosis (ICD10-I70.0).  Antimicrobials:   Cefepime, vancomycin, metronidazole   Subjective: Patient confused, moaning when I call his name but does not respond to other questions, mildly tachypneic Objective: Vitals:   11/20/20 0415 11/20/20 0430 11/20/20 0533 11/20/20 0538  BP: (!) 146/79 113/84  (!) 138/98  Pulse: 70 97  93  Resp: (!) 38 20  (!) 36  Temp:   98 F (36.7 C)   TempSrc:   Axillary   SpO2: 91% 91%  95%  Weight:      Height:        Intake/Output Summary (Last 24 hours) at 11/20/2020 0859 Last data filed at 11/19/2020 1740 Gross per 24 hour  Intake 805.22 ml  Output -  Net 805.22 ml   Filed Weights   11/12/2020 1850  Weight: 68 kg    Examination: Confused, tachypneic, laying in fetal position Rhonchi bilateral Regular S1-S2 no gallops Soft benign positive bowel sounds No edema Unable to assess neuro exam    Data Reviewed: I have personally reviewed following labs and imaging studies  CBC: Recent Labs  Lab 11/06/2020 1857 11/19/20 0640 11/20/20 0414  WBC 21.6* 17.7* 16.3*  NEUTROABS 18.9* 15.1* 14.4*  HGB 11.0* 10.1* 10.3*  HCT 31.6* 29.7* 29.1*  MCV 84.3  85.1 83.4  PLT 119* 92* 993*   Basic Metabolic Panel: Recent Labs  Lab 11/14/2020 1857 11/19/20 0640 11/19/20 1805 11/20/20 0414  NA 130* 135  --  141  K 3.1* 3.0* 3.5 3.1*  CL 89* 100  --  105  CO2 24 24  --  21*  GLUCOSE 162* 121*  --  94  BUN 29* 31*  --  38*  CREATININE 2.12* 1.70*  --  1.66*   CALCIUM 6.5* 6.1*  --  6.5*  MG  --   --   --  0.8*  PHOS  --   --   --  2.3*   GFR: Estimated Creatinine Clearance: 38.1 mL/min (A) (by C-G formula based on SCr of 1.66 mg/dL (H)). Liver Function Tests: Recent Labs  Lab 11/22/2020 1857 11/19/20 0640 11/20/20 0414  AST 47* 29 45*  ALT _0 ALKPHOS 52 45 47  BILITOT 1.5* 1.5* 2.3*  PROT 6.3* 5.8* 6.2*  ALBUMIN 1.7* 1.5* 1.7*   Recent Labs  Lab 11/28/2020 2230  LIPASE 19   No results for input(s): AMMONIA in the last 168 hours. Coagulation Profile: Recent Labs  Lab 11/08/2020 1857  INR 2.4*   Cardiac Enzymes: No results for input(s): CKTOTAL, CKMB, CKMBINDEX, TROPONINI in the last 168 hours. BNP (last 3 results) No results for input(s): PROBNP in the last 8760 hours. HbA1C: No results for input(s): HGBA1C in the last 72 hours. CBG: No results for input(s): GLUCAP in the last 168 hours. Lipid Profile: No results for input(s): CHOL, HDL, LDLCALC, TRIG, CHOLHDL, LDLDIRECT in the last 72 hours. Thyroid Function Tests: No results for input(s): TSH, T4TOTAL, FREET4, T3FREE, THYROIDAB in the last 72 hours. Anemia Panel: No results for input(s): VITAMINB12, FOLATE, FERRITIN, TIBC, IRON, RETICCTPCT in the last 72 hours. Sepsis Labs: Recent Labs  Lab 11/16/2020 1857 11/24/2020 2230 11/19/20 0640  PROCALCITON  --   --  3.00  LATICACIDVEN 4.7* 2.6*  --     Recent Results (from the past 240 hour(s))  Blood Culture (routine x 2)     Status: None (Preliminary result)   Collection Time: 11/15/2020  6:57 PM   Specimen: BLOOD  Result Value Ref Range Status   Specimen Description BLOOD LEFT ANTECUBITAL  Final   Special Requests   Final    BOTTLES DRAWN AEROBIC AND ANAEROBIC Blood Culture results may not be optimal due to an inadequate volume of blood received in culture bottles   Culture  Setup Time   Final    Organism ID to follow [place] AND ANAEROBIC BOTTLES CRITICAL RESULT CALLED TO, READ BACK  BY AND VERIFIED WITH: LISA KLUTZ AT 1903 11/19/20.MF Performed at [facility], [address] [place], [place] [address]    Culture [facility] POSITIVE COCCI  Final   Report Status PENDING  Incomplete  Blood Culture ID Panel (Reflexed)     Status: Abnormal   Collection Time: 11/13/2020  6:57 PM  Result Value Ref Range Status   Enterococcus faecalis NOT DETECTED NOT DETECTED Final   Enterococcus Faecium NOT DETECTED NOT DETECTED Final   Listeria monocytogenes NOT DETECTED NOT DETECTED Final   Staphylococcus species DETECTED (A) NOT DETECTED Final    Comment: CRITICAL RESULT CALLED TO, READ BACK BY AND VERIFIED WITH: LISA Bear AT 1903 11/19/20. MF    Staphylococcus aureus (BCID) NOT DETECTED NOT DETECTED Final   Staphylococcus epidermidis DETECTED (A) NOT DETECTED Final    Comment: Methicillin (oxacillin)  resistant coagulase negative staphylococcus. Possible blood culture contaminant (unless isolated from more than one blood culture draw or clinical case suggests pathogenicity). No antibiotic treatment is indicated for blood  culture contaminants. CRITICAL RESULT CALLED TO, READ BACK BY AND VERIFIED WITH:  LISA Radebaugh AT 1903 11/19/20. MF    Staphylococcus lugdunensis NOT DETECTED NOT DETECTED Final   Streptococcus species NOT DETECTED NOT DETECTED Final   Streptococcus agalactiae NOT DETECTED NOT DETECTED Final   Streptococcus pneumoniae NOT DETECTED NOT DETECTED Final   Streptococcus pyogenes NOT DETECTED NOT DETECTED Final   A.calcoaceticus-baumannii NOT DETECTED NOT DETECTED Final   Bacteroides fragilis NOT DETECTED NOT DETECTED Final   Enterobacterales NOT DETECTED NOT DETECTED Final   Enterobacter cloacae complex NOT DETECTED NOT DETECTED Final   Escherichia coli NOT DETECTED NOT DETECTED Final   Klebsiella aerogenes NOT DETECTED NOT DETECTED Final   Klebsiella oxytoca NOT DETECTED NOT DETECTED Final   Klebsiella pneumoniae NOT DETECTED NOT DETECTED Final   Proteus species NOT  DETECTED NOT DETECTED Final   Salmonella species NOT DETECTED NOT DETECTED Final   Serratia marcescens NOT DETECTED NOT DETECTED Final   Haemophilus influenzae NOT DETECTED NOT DETECTED Final   Neisseria meningitidis NOT DETECTED NOT DETECTED Final   Pseudomonas aeruginosa NOT DETECTED NOT DETECTED Final   Stenotrophomonas maltophilia NOT DETECTED NOT DETECTED Final   Candida albicans NOT DETECTED NOT DETECTED Final   Candida auris NOT DETECTED NOT DETECTED Final   Candida glabrata NOT DETECTED NOT DETECTED Final   Candida krusei NOT DETECTED NOT DETECTED Final   Candida parapsilosis NOT DETECTED NOT DETECTED Final   Candida tropicalis NOT DETECTED NOT DETECTED Final   Cryptococcus neoformans/gattii NOT DETECTED NOT DETECTED Final   Methicillin resistance mecA/C DETECTED (A) NOT DETECTED Final    Comment: CRITICAL RESULT CALLED TO, READ BACK BY AND VERIFIED WITH: LISA Hargan AT 1903 11/19/20. MF Performed at [facility], [address] [place], [place] [address]   Blood Culture (routine x 2)     Status: None (Preliminary result)   Collection Time: 11/10/2020  6:58 PM   Specimen: BLOOD  Result Value Ref Range Status   Specimen Description BLOOD RIGHT ANTECUBITAL  Final   Special Requests   Final    BOTTLES DRAWN AEROBIC AND ANAEROBIC Blood Culture adequate volume   Culture  Setup Time   Final    GRAM POSITIVE COCCI AEROBIC BOTTLE ONLY CRITICAL VALUE NOTED.  VALUE IS CONSISTENT WITH PREVIOUSLY REPORTED AND CALLED VALUE. Performed at [facility], [address] [place], [place] [address]    Culture [facility] POSITIVE COCCI  Final   Report Status PENDING  Incomplete  Urine culture     Status: Abnormal   Collection Time: 11/10/2020  6:58 PM   Specimen: Urine, Random  Result Value Ref Range Status   Specimen Description   Final    URINE, RANDOM Performed at [facility], [address], [place], [place] [address]    Special Requests   Final    NONE Performed  at [facility], [address] [place], [place] [address]    Culture MULTIPLE SPECIES PRESENT, SUGGEST RECOLLECTION (A)  Final   Report Status 11/20/2020 FINAL  Final  Gastrointestinal Panel by PCR , Stool     Status: None   Collection Time: 11/19/20  6:40 AM   Specimen: Stool  Result Value Ref Range Status   Campylobacter species NOT DETECTED NOT DETECTED Final   Plesimonas shigelloides NOT DETECTED NOT DETECTED  Final   Salmonella species NOT DETECTED NOT DETECTED Final   Yersinia enterocolitica NOT DETECTED NOT DETECTED Final   Vibrio species NOT DETECTED NOT DETECTED Final   Vibrio cholerae NOT DETECTED NOT DETECTED Final   Enteroaggregative E coli (EAEC) NOT DETECTED NOT DETECTED Final   Enteropathogenic E coli (EPEC) NOT DETECTED NOT DETECTED Final   Enterotoxigenic E coli (ETEC) NOT DETECTED NOT DETECTED Final   Shiga like toxin producing E coli (STEC) NOT DETECTED NOT DETECTED Final   Shigella/Enteroinvasive E coli (EIEC) NOT DETECTED NOT DETECTED Final   Cryptosporidium NOT DETECTED NOT DETECTED Final   Cyclospora cayetanensis NOT DETECTED NOT DETECTED Final   Entamoeba histolytica NOT DETECTED NOT DETECTED Final   Giardia lamblia NOT DETECTED NOT DETECTED Final   Adenovirus F40/41 NOT DETECTED NOT DETECTED Final   Astrovirus NOT DETECTED NOT DETECTED Final   Norovirus GI/GII NOT DETECTED NOT DETECTED Final   Rotavirus A NOT DETECTED NOT DETECTED Final   Sapovirus (I, II, IV, and V) NOT DETECTED NOT DETECTED Final    Comment: Performed at [facility], [address], [place], [place] [address]         Radiology Studies: CT ABDOMEN PELVIS WO CONTRAST  Result Date: 10/31/2020 CLINICAL DATA:  COVID pneumonia, generalized weakness, sepsis, vomiting, unspecified abdominal pain EXAM: CT ABDOMEN AND PELVIS WITHOUT CONTRAST TECHNIQUE: Multidetector CT imaging of the abdomen and pelvis was performed following the standard protocol without IV contrast.  COMPARISON:  None. FINDINGS: Lower chest: There is asymmetric peribronchial nodularity within the visualized lung bases with airway impaction noted, best noted within the segmental and subsegmental bronchi of the left lower lobe and basilar lingula in keeping with acute infection or aspiration. Trace bilateral pleural effusions are present. Right basilar parenchymal scarring is noted. Extensive right coronary artery calcification. Global cardiac size within normal limits. No pericardial effusion. Small gastroesophageal varices are identified. Hepatobiliary: There is hypertrophy of the left hepatic lobe and caudate lobe and the liver contour is mildly nodular in keeping with changes of cirrhosis. Recanalization of the umbilical vein is noted. No definite focal intrahepatic mass identified on this noncontrast examination. No definite intra or extrahepatic biliary ductal dilation. Gallbladder unremarkable. Pancreas: Unremarkable Spleen: Unremarkable Adrenals/Urinary Tract: The adrenal glands are unremarkable. The right kidney is markedly atrophic. The left kidney is normal in size and position and demonstrates preserved cortical thickness. There is no hydronephrosis. Extensive vascular calcifications are noted within the renal hilum. Mild nonspecific perinephric stranding is present. No loculated pararenal fluid collections identified. No ureteral calculi. The bladder is unremarkable. Stomach/Bowel: The stomach is fluid-filled, as is the duodenum. There is asymmetric mesenteric edema and circumferential bowel wall thickening involving the proximal jejunum which appears mildly dilated suggesting changes of a a infectious or inflammatory focal enteritis with resultant partial small bowel obstruction. A discrete focal transition point is not identified. The remainder of the small bowel is unremarkable. There is large volume stool within the distal colon and rectal vault. No free intraperitoneal gas or fluid. The appendix  is unremarkable. Vascular/Lymphatic: Extensive aortoiliac atherosclerotic calcification is present. Particularly prominent atherosclerotic calcification is seen within the a proximal renal arteries and mesenteric arteries with almost certain hemodynamically significant stenosis of the celiac axis and superior mesenteric artery proximally, though this is not well assessed on this noncontrast examination. Bilateral lower extremity arterial inflow stenting has been performed. Inferior vena cava filter is seen within the infrarenal cava. No pathologic adenopathy within the abdomen and pelvis. Reproductive: Prostate is unremarkable.  Other: There is infiltrative change within the right inguinal region superficial to the a common femoral artery. This may represent the sequela of a recent vascular intervention with resultant hematoma, a local inflammatory process such as a developing abscess or suppurative lymph node, or an infiltrative mass. This is not well assessed on this noncontrast examination. This measures 2.4 x 3.2 cm on axial image # 77 Musculoskeletal: No lytic or blastic bone lesions are seen. IMPRESSION: Bibasilar pulmonary infiltrates and airway impaction most in keeping with changes of acute infection or aspiration. Given the findings below, aspiration is favored. Circumferential bowel wall thickening and focal mesenteric edema involving the proximal jejunum just beyond the ligament of Treitz suggesting an underlying infectious or inflammatory enteritis. Focal ischemia would be unusual in this location, however, given the degree of vascular disease, segmental vascular thrombosis and/or distal embolization could also result in a similar appearance. Resultant partial small bowel obstruction with fluid-filled duodenum and stomach. Nasogastric intubation may be helpful for further management. Peripheral vascular disease with extensive calcification and almost certain hemodynamically significant stenosis involving  the proximal mesenteric arterial vasculature. The degree of stenosis would be better assessed with CT arteriography, if indicated. Infiltrative change within the right inguinal region, possibly posttraumatic, infectious, or neoplastic. This could be further assessed with dedicated sonography. Aortic Atherosclerosis (ICD10-I70.0). Electronically Signed   By: Fidela Salisbury MD   On: 11/19/2020 22:18   DG Chest Port 1 View  Result Date: 11/19/2020 CLINICAL DATA:  Shortness of breath.  COVID positive. EXAM: PORTABLE CHEST 1 VIEW COMPARISON:  November 18, 2020 FINDINGS: The heart size and mediastinal contours are within normal limits. Patchy consolidations identified in bilateral lung bases worse compared prior exam. There are probable small bilateral pleural effusions. There is no pulmonary edema. The visualized skeletal structures are stable. IMPRESSION: Patchy consolidations identified in bilateral lung bases worse compared prior exam, consistent with pneumonia. Electronically Signed   By: Abelardo Diesel M.D.   On: 11/19/2020 14:21   DG Chest Port 1 View  Result Date: 11/10/2020 CLINICAL DATA:  Sepsis EXAM: PORTABLE CHEST 1 VIEW COMPARISON:  12/18/2019 FINDINGS: Cardiac shadow is stable. Aortic calcifications are again seen. Mild bibasilar atelectatic changes are noted. No focal infiltrate or sizable effusion is seen. Skin fold is noted over the lateral chest bilaterally. No bony abnormality is seen. IMPRESSION: Mild bibasilar atelectasis. Electronically Signed   By: Inez Catalina M.D.   On: 11/17/2020 19:13        Scheduled Meds: . acidophilus  2 capsule Oral Daily  . enoxaparin (LOVENOX) injection  40 mg Subcutaneous Q24H  . ipratropium-albuterol  3 mL Nebulization Q4H  . methylPREDNISolone (SOLU-MEDROL) injection  40 mg Intravenous Q12H  . midodrine  2.5 mg Oral TID WC  . sodium phosphate  1 enema Rectal Once   Continuous Infusions: . ceFEPime (MAXIPIME) IV Stopped (11/19/20 2142)  .  lacosamide (VIMPAT) IV Stopped (11/19/20 2109)  . metronidazole Stopped (11/19/20 2359)  . potassium chloride 10 mEq (11/20/20 0726)  . potassium PHOSPHATE IVPB (in mmol) 15 mmol (11/20/20 0729)  . remdesivir 100 mg in NS 100 mL Stopped (11/19/20 1321)  . vancomycin Stopped (11/19/20 2008)    Assessment & Plan:   Principal Problem:   Severe sepsis ([place]) Active Problems:   Acute encephalopathy   Pneumonia   Chronic atrial fibrillation (HCC)   Chronic pain   Peripheral vascular disease, unspecified (HCC)   HTN (hypertension)   Cirrhosis (HCC)   COPD with chronic bronchitis ([place])  CAD (coronary artery disease)   Chronic anticoagulation   Chronic, continuous use of opioids   AKI (acute kidney injury) ([place])   Hyponatremia   Elevated troponin   Acute metabolic encephalopathy   COVID-19 virus infection   Acute respiratory failure with hypoxia (HCC)   Seizure disorder (HCC)   Acute gastroenteritis   Partial small bowel obstruction (HCC)   Pneumonia due to COVID-68 virus   75 year old male with a history of A. fib on Xarelto, seizure disorder on Vimpat, alcoholic cirrhosis(stopped drinking 5 years ago), CAD, COPD, HTN, unvaccinated against COVID, presenting decreased appetite x 5 days and I day of nausea and vomiting and abdominal pain and altered mental status.  Met severe sepsis criteria and tested positive for covid.    Severe sepsis/septic shock ([place]) - Patient meeting severe sepsis criteria with fever, hypotension, leukocytosis with lactic acid 4.7 altered mental status, AKI and acute respiratory failure - Septic source uncertain: COVID+,  urinalysis sterile,.  Suspecting intra-abdominal source due to vomiting and diarrhea 1/23-leukocytosis improving. Procalcitonin elevated. See CT A/P results above Chest x-ray yesterday revealed patchy consolidation bilateral lungs worse than prior exam which is likely consistent to aspiration since he vomited He is COVID-positive Ucx  multiple organisms, recommend recollection Blood culture with staph epi ,MRSA detected ID consulted Will repeat blood cultures Per ID continue vancomycin for his coagulase-negative staph infection ID also recommends continuing cefepime and Flagyl given the concerns for possible intra-abdominal infection and also aspiration pneumonia We will continue broad-spectrum antibiotics with vancomycin, metronidazole and cefepime Critical care consulted as patient is at risk of intubation Continue IV fluids Continue midodrine -initially started for low BP     Acute metabolic encephalopathy Neurochecks Fall and aspiration precautions Treatment as above for underlying sepsis        Acute respiratory failure with hypoxia ([place])   Possible covid PNA v.s. Aspiration PNA. Repeat cxr from 1/22 with worsening b/l consolidation Possibly both, but suspect more aspiration PNA PCCM consulted since patient at risk of intubation Currently on 6 L satting 89 to 94% Will continue remdesivir Continue IV steroids Was transferred to stepdown unit closer observation Continue IV antibiotics as above    Aspiration PNA This a.m. tachypneic, on 6 L satting 89 to 94% PCCM following Continue antibiotics as above Continue n.p.o. for now due to intra-abdominal issues Aspiration precautions  Coagulopathy INR elevated on 1/21.  Elevated fibrin derv.  Will ck stat inr Concern with DIC Will consult hematology      AKI (acute kidney injury) (HCC)-prerenal - Secondary to vomiting and diarrhea as well as severe sepsis Improving with IV fluids, creatinine 1.66 Avoid nephrotoxic medication and renally dose other meds Continue to monitor levels      Acute gastroenteritis, possibly COVID-related - Patient presents with a several hour history of vomiting, diarrhea and abdominal pain - Possibly COVID gastroenteritis- inflammatory v.s. infectious. 1/22-see CT a/p report Support care   Partial SBO on CT  abdomen and pelvis - CT abdomen resulted after admission outlined above showing partial small bowel obstruction with fluid-filled duodenum and stomach 1/22-NGT to be placed, per nsg unable to place , pt vomited during placement and was fightening the tube. 1/23-multiple +BM  gsx following. Recommend continue NPO.  Per surgery can consider CT angio if worsening abdominal pain or persistent nausea and vomiting but again and/V/D with no obvious complaints of abdominal pain is not consistent with acute ischemic bowel Continue ivf.     Hyponatremia - Serum sodium 130>>>135>>>141 Improved with ivf  hydration     Elevated troponin, likely demand ischemia   History of CAD 1/22-TP trending down.   echo -pending     Chronic atrial fibrillation (HCC)   Chronic anticoagulation - Hold rate control agent due to hypotension on admission - ?Alen Blew, unsure if pt was taking this.    Chronic pain on chronic opiates - Holding OxyContin for now due to altered mental status - As needed short acting pain meds as needed  Hypotension with history of HTN (hypertension) - Hold home antihypertensives for now due to hypotension from severe sepsis and resume as appropriate Stared on midodrine, will continue  Alcoholic cirrhosis (HCC) - Appears stable.  No alcohol use in 5 years    COPD with chronic bronchitis ([place]) - Not acutely exacerbated    COVID-positive status - Possibly causing acute gastroenteritis as above, possibly incidental -see above about respiratory status. -crp and fibrin derv. Elevated.     Seizure disorder ([place]) - Continue Vimpat - Ativan as needed.  IV substitution for Vimpat   Discussed A/P with Dr. Bretta Bang.  Will also consult palliative care.   DVT prophylaxis: lovenox Code Status:full Family Communication: updated wife   Status is: Inpatient  Remains inpatient appropriate because:IV treatments appropriate due to intensity of illness or inability to take  PO   Dispo: The patient is from: Home              Anticipated d/c is to: Home              Anticipated d/c date is: > 3 days              Patient currently is not medically stable to d/c.Px guarded, at risk of intubation and possible death.   Difficult to place patient No            LOS: 2 days   Time spent: 45 minutes with more than 50% on [place], MD [facility] Pager [phone]  If 7PM-7AM, please contact night-coverage 11/20/2020, 8:59 AM

## 2020-11-21 DIAGNOSIS — U071 COVID-19: Secondary | ICD-10-CM | POA: Diagnosis not present

## 2020-11-21 DIAGNOSIS — D689 Coagulation defect, unspecified: Secondary | ICD-10-CM | POA: Diagnosis present

## 2020-11-21 DIAGNOSIS — R7881 Bacteremia: Secondary | ICD-10-CM | POA: Diagnosis not present

## 2020-11-21 DIAGNOSIS — R1110 Vomiting, unspecified: Secondary | ICD-10-CM

## 2020-11-21 DIAGNOSIS — A419 Sepsis, unspecified organism: Secondary | ICD-10-CM | POA: Diagnosis not present

## 2020-11-21 DIAGNOSIS — B957 Other staphylococcus as the cause of diseases classified elsewhere: Secondary | ICD-10-CM

## 2020-11-21 DIAGNOSIS — J9601 Acute respiratory failure with hypoxia: Secondary | ICD-10-CM | POA: Diagnosis not present

## 2020-11-21 DIAGNOSIS — R6520 Severe sepsis without septic shock: Secondary | ICD-10-CM | POA: Diagnosis not present

## 2020-11-21 DIAGNOSIS — G9340 Encephalopathy, unspecified: Secondary | ICD-10-CM | POA: Diagnosis not present

## 2020-11-21 DIAGNOSIS — K7030 Alcoholic cirrhosis of liver without ascites: Secondary | ICD-10-CM

## 2020-11-21 LAB — CBC WITH DIFFERENTIAL/PLATELET
Abs Immature Granulocytes: 0.09 10*3/uL — ABNORMAL HIGH (ref 0.00–0.07)
Basophils Absolute: 0 10*3/uL (ref 0.0–0.1)
Basophils Relative: 0 %
Eosinophils Absolute: 0 10*3/uL (ref 0.0–0.5)
Eosinophils Relative: 0 %
HCT: 26.3 % — ABNORMAL LOW (ref 39.0–52.0)
Hemoglobin: 9.6 g/dL — ABNORMAL LOW (ref 13.0–17.0)
Immature Granulocytes: 1 %
Lymphocytes Relative: 7 %
Lymphs Abs: 1 10*3/uL (ref 0.7–4.0)
MCH: 29.8 pg (ref 26.0–34.0)
MCHC: 36.5 g/dL — ABNORMAL HIGH (ref 30.0–36.0)
MCV: 81.7 fL (ref 80.0–100.0)
Monocytes Absolute: 0.7 10*3/uL (ref 0.1–1.0)
Monocytes Relative: 6 %
Neutro Abs: 11.2 10*3/uL — ABNORMAL HIGH (ref 1.7–7.7)
Neutrophils Relative %: 86 %
Platelets: 107 10*3/uL — ABNORMAL LOW (ref 150–400)
RBC: 3.22 MIL/uL — ABNORMAL LOW (ref 4.22–5.81)
RDW: 13.7 % (ref 11.5–15.5)
Smear Review: NORMAL
WBC: 13.1 10*3/uL — ABNORMAL HIGH (ref 4.0–10.5)
nRBC: 0 % (ref 0.0–0.2)

## 2020-11-21 LAB — COMPREHENSIVE METABOLIC PANEL
ALT: 12 U/L (ref 0–44)
AST: 48 U/L — ABNORMAL HIGH (ref 15–41)
Albumin: 1.5 g/dL — ABNORMAL LOW (ref 3.5–5.0)
Alkaline Phosphatase: 49 U/L (ref 38–126)
Anion gap: 11 (ref 5–15)
BUN: 37 mg/dL — ABNORMAL HIGH (ref 8–23)
CO2: 21 mmol/L — ABNORMAL LOW (ref 22–32)
Calcium: 6.8 mg/dL — ABNORMAL LOW (ref 8.9–10.3)
Chloride: 114 mmol/L — ABNORMAL HIGH (ref 98–111)
Creatinine, Ser: 1.5 mg/dL — ABNORMAL HIGH (ref 0.61–1.24)
GFR, Estimated: 49 mL/min — ABNORMAL LOW (ref >60)
Glucose, Bld: 175 mg/dL — ABNORMAL HIGH (ref 70–99)
Potassium: 3.1 mmol/L — ABNORMAL LOW (ref 3.5–5.1)
Sodium: 146 mmol/L — ABNORMAL HIGH (ref 135–145)
Total Bilirubin: 1.9 mg/dL — ABNORMAL HIGH (ref 0.3–1.2)
Total Protein: 5.6 g/dL — ABNORMAL LOW (ref 6.5–8.1)

## 2020-11-21 LAB — LACTATE DEHYDROGENASE: LDH: 259 U/L — ABNORMAL HIGH (ref 98–192)

## 2020-11-21 LAB — FIBRINOGEN: Fibrinogen: 329 mg/dL (ref 210–475)

## 2020-11-21 LAB — PROTIME-INR
INR: 2.8 — ABNORMAL HIGH (ref 0.8–1.2)
Prothrombin Time: 28.7 seconds — ABNORMAL HIGH (ref 11.4–15.2)

## 2020-11-21 LAB — PHOSPHORUS: Phosphorus: 1.7 mg/dL — ABNORMAL LOW (ref 2.5–4.6)

## 2020-11-21 LAB — CALCIUM, IONIZED: Calcium, Ionized, Serum: 4 mg/dL — ABNORMAL LOW (ref 4.5–5.6)

## 2020-11-21 LAB — MAGNESIUM: Magnesium: 1.7 mg/dL (ref 1.7–2.4)

## 2020-11-21 LAB — FIBRIN DERIVATIVES D-DIMER (ARMC ONLY): Fibrin derivatives D-dimer (ARMC): 3681.63 ng/mL (FEU) — ABNORMAL HIGH (ref 0.00–499.00)

## 2020-11-21 LAB — C-REACTIVE PROTEIN: CRP: 17.2 mg/dL — ABNORMAL HIGH (ref <1.0)

## 2020-11-21 LAB — PATHOLOGIST SMEAR REVIEW

## 2020-11-21 MED ORDER — VANCOMYCIN HCL IN DEXTROSE 1-5 GM/200ML-% IV SOLN
1000.0000 mg | INTRAVENOUS | Status: DC
  Administered 2020-11-21: 1000 mg via INTRAVENOUS
  Filled 2020-11-21 (×2): qty 200

## 2020-11-21 MED ORDER — SODIUM CHLORIDE 0.9 % IV SOLN
3.0000 g | Freq: Four times a day (QID) | INTRAVENOUS | Status: DC
  Administered 2020-11-21 – 2020-11-23 (×7): 3 g via INTRAVENOUS
  Filled 2020-11-21 (×2): qty 8
  Filled 2020-11-21: qty 3
  Filled 2020-11-21 (×2): qty 8
  Filled 2020-11-21: qty 3
  Filled 2020-11-21 (×5): qty 8

## 2020-11-21 MED ORDER — MAGNESIUM SULFATE 2 GM/50ML IV SOLN
2.0000 g | Freq: Once | INTRAVENOUS | Status: AC
  Administered 2020-11-21: 2 g via INTRAVENOUS
  Filled 2020-11-21: qty 50

## 2020-11-21 MED ORDER — KCL IN DEXTROSE-NACL 20-5-0.45 MEQ/L-%-% IV SOLN
INTRAVENOUS | Status: DC
  Filled 2020-11-21 (×7): qty 1000

## 2020-11-21 MED ORDER — POTASSIUM PHOSPHATES 15 MMOLE/5ML IV SOLN
20.0000 mmol | Freq: Once | INTRAVENOUS | Status: AC
  Administered 2020-11-21: 20 mmol via INTRAVENOUS
  Filled 2020-11-21 (×2): qty 6.67

## 2020-11-21 NOTE — Progress Notes (Signed)
Pharmacy Antibiotic Note  Stanley Nguyen is a 75 y.o. male with PMH A. fib on Xarelto, seizure disorder on Vimpat, alcoholic cirrhosis(stopped drinking 5 years ago), CAD, COPD, HTNadmitted on 11/28/2020 with sepsis of unclear origin. Pharmacy has been consulted for vancomycin and cefepime dosing. Patient is also ordered metronidazole. ID is following.   His Scr on admission was elevated above what appears to be his baseline level, slowly improving.   Plan:  1) Continue cefepime 2 grams IV every 12 hours  2) Increase vancomycin to 1000 mg IV every 24 hours  Goal AUC 400-550.  Expected AUC: 486  SCr used: 1.5   Css (calculated): 32.2 / 12.2 mcg/mL  Daily SCr while on vancomycin   Levels as clinically indicated  Height: 5\' 10"  (177.8 cm) Weight: 68 kg (150 lb) IBW/kg (Calculated) : 73  Temp (24hrs), Avg:97.1 F (36.2 C), Min:95.9 F (35.5 C), Max:97.7 F (36.5 C)  Recent Labs  Lab 11/08/2020 1857 11/15/2020 2230 11/19/20 0640 11/20/20 0414 11/21/20 0236  WBC 21.6*  --  17.7* 16.3* 13.1*  CREATININE 2.12*  --  1.70* 1.66* 1.50*  LATICACIDVEN 4.7* 2.6*  --   --   --     Estimated Creatinine Clearance: 42.2 mL/min (A) (by C-G formula based on SCr of 1.5 mg/dL (H)).    Antimicrobials this admission: Remdesivir 1/21 >> Cefepime 1/21  >>  Vancomycin 1/21  >>  Metronidazole 1/22 >>  Microbiology results: 1/23 UCx: pending 1/23 MRSA PCR: Positive 1/23 BCx: NGTD 1/21 BCx: Staphylococcus epidermidis (2/2 sets) & Staphylococcus capitis (1/2 sets). Sensitivities pending. MecA detected by BCID. 1/21 UCx: Multiple species 1/22 GI panel: Negative 1/21 SARS CoV-2: Positive  Thank you for allowing pharmacy to be a part of this patient's care.  2/21 11/21/2020 3:27 PM

## 2020-11-21 NOTE — Plan of Care (Signed)
  Problem: Education: Goal: Knowledge of General Education information will improve Description Including pain rating scale, medication(s)/side effects and non-pharmacologic comfort measures Outcome: Progressing   Problem: Health Behavior/Discharge Planning: Goal: Ability to manage health-related needs will improve Outcome: Progressing   

## 2020-11-21 NOTE — Assessment & Plan Note (Addendum)
#  75 year old male patient with multiple medical problems-COPD hypertension cirrhosis CAD-A. fib on Xarelto currently admitted to with hospital for Covid infection/small bowel obstruction s-noted to have elevated PT PTT/D-dimer levels.  #Elevated PT PTT-multifactorial-secondary Xarelto/antibiotics/malnutrition/underlying cirrhosis/sepsis. No evidence of any acute liver failure-AST ALT bilirubin normal question DIC.  ##Sepsis -question etiology intra-abdominal source versus COVID positive.  Partial small bowel obstruction-as per primary team/surgery.  Recommendations:  #Recommend peripheral smear/LDH; checking fibrinogen levels. If fibrinogen levels are low [less than 100]-would recommend cryoprecipitate. However, if adequate fibrinogen-I would suspect patient's elevated PT PTT is not from DIC; but from other causes mentioned above. Await above work-up.  Thank you Dr.Amery for allowing me to participate in the care of your pleasant patient. Please do not hesitate to contact me with questions or concerns in the interim.

## 2020-11-21 NOTE — Consult Note (Signed)
Stanley Nguyen CONSULT NOTE  Patient Care Team: [name], MD as PCP - General (Internal Medicine)  CHIEF COMPLAINTS/PURPOSE OF CONSULTATION: coagulopathy  HISTORY OF PRESENTING ILLNESS:  Stanley Nguyen 75 y.o.  male with multiple medical problems including A. fib on Xarelto seizure disorder; alcoholic cirrhosis; COPD hypertension-unvaccinated to COVID is currently admitted to partial small bowel obstruction. Patient also noted to be Covid positive.  Patient is being managed conservatively from surgical standpoint. Concerns of aspiration noted on chest x-ray. Hematology has been consulted for elevated PT PTT-concerns for DIC.   Of note patient is confused/drowsy.    Review of Systems  Unable to perform ROS: Mental status change     MEDICAL HISTORY:  Past Medical History:  Diagnosis Date  . Atrial fibrillation (HCC)   . CAD (coronary artery disease)   . COPD (chronic obstructive pulmonary disease) (HCC)   . Dysrhythmia    af. patient unaware of a diagnosis of a fib  . Gangrene (HCC) 09/27/2017   left foot/toes  . Hypertension   . Liver cirrhosis (HCC)   . Liver cirrhosis (HCC)   . Osteomyelitis (HCC) 2018   right ankle and foot  . Peripheral vascular disease (HCC)   . Pre-diabetes   . Seizure disorder (HCC) 2015   last seizure 2-3 years ago. takes vimpat    SURGICAL HISTORY: Past Surgical History:  Procedure Laterality Date  . AMPUTATION TOE Left 09/27/2017   Procedure: AMPUTATION TOE-LEFT GREAT TOE, LEFT SECOND TOE;  Surgeon: Gwyneth Revels, DPM;  Location: [facility];  Service: Podiatry;  Laterality: Left;  . CARDIAC CATHETERIZATION     no stents  . ENDARTERECTOMY FEMORAL Right 07/17/2017   Procedure: ENDARTERECTOMY FEMORAL;  Surgeon: Annice Needy, MD;  Location: [facility];  Service: Vascular;  Laterality: Right;  . HERNIA REPAIR  1998   umbilical  . IRRIGATION AND DEBRIDEMENT FOOT Right 07/12/2017   Procedure: IRRIGATION AND DEBRIDEMENT FOOT;   Surgeon: Gwyneth Revels, DPM;  Location: [facility];  Service: Podiatry;  Laterality: Right;  . LOWER EXTREMITY ANGIOGRAM Right 07/17/2017   Procedure: LOWER EXTREMITY ANGIOGRAM ( FSA STENT PLACEMENT );  Surgeon: Annice Needy, MD;  Location: [facility];  Service: Vascular;  Laterality: Right;  . LOWER EXTREMITY ANGIOGRAPHY Right 07/15/2017   Procedure: Lower Extremity Angiography;  Surgeon: Annice Needy, MD;  Location: [facility];  Service: Cardiovascular;  Laterality: Right;  . LOWER EXTREMITY ANGIOGRAPHY Left 09/11/2017   Procedure: Lower Extremity Angiography;  Surgeon: Annice Needy, MD;  Location: [facility];  Service: Cardiovascular;  Laterality: Left;  . LOWER EXTREMITY ANGIOGRAPHY Left 09/13/2017   Procedure: Lower Extremity Angiography;  Surgeon: Annice Needy, MD;  Location: [facility];  Service: Cardiovascular;  Laterality: Left;  . LOWER EXTREMITY INTERVENTION  09/11/2017   Procedure: LOWER EXTREMITY INTERVENTION;  Surgeon: Annice Needy, MD;  Location: [facility];  Service: Cardiovascular;;    SOCIAL HISTORY: Social History   Socioeconomic History  . Marital status: Married    Spouse name: Not on file  . Number of children: Not on file  . Years of education: Not on file  . Highest education level: Not on file  Occupational History  . Not on file  Tobacco Use  . Smoking status: Former Smoker    Packs/day: 1.00    Types: Cigarettes    Quit date: 09/04/2017    Years since quitting: 3.2  . Smokeless tobacco: Never Used  .  Tobacco comment: still smokes but less  Vaping Use  . Vaping Use: Never used  Substance and Sexual Activity  . Alcohol use: No    Comment: previously a drinker. stopped 6 years ago.  . Drug use: No  . Sexual activity: Not on file  Other Topics Concern  . Not on file  Social History Narrative  . Not on file   Social Determinants of Health   Financial Resource Strain: Not on file  Food Insecurity: Not on file   Transportation Needs: Not on file  Physical Activity: Not on file  Stress: Not on file  Social Connections: Not on file  Intimate Partner Violence: Not on file    FAMILY HISTORY: No family history on file.  ALLERGIES:  is allergic to keppra [levetiracetam].  MEDICATIONS:  Current Facility-Administered Medications  Medication Dose Route Frequency Provider Last Rate Last Admin  . acetaminophen (TYLENOL) tablet 650 mg  650 mg Oral Q6H PRN Andris Baumann, MD       Or  . acetaminophen (TYLENOL) suppository 650 mg  650 mg Rectal Q6H PRN Andris Baumann, MD      . acidophilus (RISAQUAD) capsule 2 capsule  2 capsule Oral Daily Amery, Sahar, MD      . ceFEPIme (MAXIPIME) 2 g in sodium chloride 0.9 % 100 mL IVPB  2 g Intravenous Q12H Lowella Bandy, RPH 200 mL/hr at 11/21/20 0901 2 g at 11/21/20 0901  . Chlorhexidine Gluconate Cloth 2 % PADS 6 each  6 each Topical Daily Lynn Ito, MD   6 each at 11/20/20 1430  . chlorpheniramine-HYDROcodone (TUSSIONEX) 10-8 MG/5ML suspension 5 mL  5 mL Oral Q12H PRN Lindajo Royal V, MD      . dextrose 5 % and 0.45 % NaCl with KCl 20 mEq/L infusion   Intravenous Continuous Kasa, Kurian, MD      . enoxaparin (LOVENOX) injection 40 mg  40 mg Subcutaneous Q24H Andris Baumann, MD   40 mg at 11/21/20 0923  . guaiFENesin-dextromethorphan (ROBITUSSIN DM) 100-10 MG/5ML syrup 10 mL  10 mL Oral Q4H PRN Andris Baumann, MD      . lacosamide (VIMPAT) 50 mg in sodium chloride 0.9 % 25 mL IVPB  50 mg Intravenous Q12H Lindajo Royal V, MD 60 mL/hr at 11/21/20 1000 50 mg at 11/21/20 1000  . LORazepam (ATIVAN) injection 2 mg  2 mg Intravenous Q2H PRN Lindajo Royal V, MD      . methylPREDNISolone sodium succinate (SOLU-MEDROL) 40 mg/mL injection 40 mg  40 mg Intravenous Q12H Arbie Cookey, MD   40 mg at 11/21/20 0503  . metroNIDAZOLE (FLAGYL) IVPB 500 mg  500 mg Intravenous Q8H Lindajo Royal V, MD 100 mL/hr at 11/21/20 0753 500 mg at 11/21/20 0753  . midodrine (PROAMATINE)  tablet 2.5 mg  2.5 mg Oral TID WC Lynn Ito, MD      . mupirocin ointment (BACTROBAN) 2 % 1 application  1 application Nasal BID Lynn Ito, MD   1 application at 11/21/20 0902  . ondansetron (ZOFRAN) tablet 4 mg  4 mg Oral Q6H PRN Andris Baumann, MD       Or  . ondansetron [facility]) injection 4 mg  4 mg Intravenous Q6H PRN Andris Baumann, MD      . remdesivir 100 mg in sodium chloride 0.9 % 100 mL IVPB  100 mg Intravenous Daily Lindajo Royal V, MD 200 mL/hr at 11/21/20 1157 100 mg at 11/21/20 1157  . sodium phosphate (  FLEET) 7-19 GM/118ML enema 1 enema  1 enema Rectal Once Arbie CookeyBashir, Khalid, MD      . vancomycin (VANCOREADY) IVPB 750 mg/150 mL  750 mg Intravenous Q24H Valrie HartHall, Scott A, RPH   Paused at 11/20/20 1819      .  PHYSICAL EXAMINATION:  Vitals:   11/21/20 0700 11/21/20 0812  BP:    Pulse: 76   Resp: (!) 27   Temp: (!) 97.52 F (36.4 C)   SpO2: 96% 96%   Filed Weights   11/14/2020 1850  Weight: 150 lb (68 kg)    Physical Exam Constitutional:      Comments: Drowsy not arousable. Alone.  HENT:     Head: Normocephalic and atraumatic.     Mouth/Throat:     Mouth: Oropharynx is clear and moist.     Pharynx: No oropharyngeal exudate.  Eyes:     Pupils: Pupils are equal, round, and reactive to light.  Cardiovascular:     Rate and Rhythm: Normal rate and regular rhythm.  Pulmonary:     Effort: No respiratory distress.     Breath sounds: No wheezing.     Comments: Decreased air entry bilaterally. Abdominal:     General: Bowel sounds are normal. There is no distension.     Palpations: Abdomen is soft. There is no mass.     Tenderness: There is no abdominal tenderness. There is no guarding or rebound.  Musculoskeletal:        General: No tenderness or edema. Normal range of motion.     Cervical back: Normal range of motion and neck supple.  Skin:    General: Skin is warm.  Neurological:     Comments: Drowsy; does not respond to verbal stimulus.  Psychiatric:         Mood and Affect: Affect normal.      LABORATORY DATA:  I have reviewed the data as listed Lab Results  Component Value Date   WBC 13.1 (H) 11/21/2020   HGB 9.6 (L) 11/21/2020   HCT 26.3 (L) 11/21/2020   MCV 81.7 11/21/2020   PLT 107 (L) 11/21/2020   Recent Labs    12/18/19 1320 12/19/19 0533 11/14/2020 1857 11/19/20 0640 11/19/20 1805 11/20/20 0414 11/21/20 0236  NA 135 137   < > 135  --  141 146*  K 3.6 3.1*   < > 3.0* 3.5 3.1* 3.1*  CL 98 103   < > 100  --  105 114*  CO2 28 24   < > 24  --  21* 21*  GLUCOSE 104* 106*   < > 121*  --  94 175*  BUN 16 15   < > 31*  --  38* 37*  CREATININE 1.23 1.19   < > 1.70*  --  1.66* 1.50*  CALCIUM 8.8* 8.3*   < > 6.1*  --  6.5* 6.8*  GFRNONAA 58* >60   < > 42*  --  43* 49*  GFRAA >60 >60  --   --   --   --   --   PROT 7.3  --    < > 5.8*  --  6.2* 5.6*  ALBUMIN 2.5*  --    < > 1.5*  --  1.7* 1.5*  AST 19  --    < > 29  --  45* 48*  ALT 9  --    < > 9  --  11 12  ALKPHOS 65  --    < >  45  --  47 49  BILITOT 0.9  --    < > 1.5*  --  2.3* 1.9*   < > = values in this interval not displayed.    RADIOGRAPHIC STUDIES: I have personally reviewed the radiological images as listed and agreed with the findings in the report. CT ABDOMEN PELVIS WO CONTRAST  Result Date: 11/05/2020 CLINICAL DATA:  COVID pneumonia, generalized weakness, sepsis, vomiting, unspecified abdominal pain EXAM: CT ABDOMEN AND PELVIS WITHOUT CONTRAST TECHNIQUE: Multidetector CT imaging of the abdomen and pelvis was performed following the standard protocol without IV contrast. COMPARISON:  None. FINDINGS: Lower chest: There is asymmetric peribronchial nodularity within the visualized lung bases with airway impaction noted, best noted within the segmental and subsegmental bronchi of the left lower lobe and basilar lingula in keeping with acute infection or aspiration. Trace bilateral pleural effusions are present. Right basilar parenchymal scarring is noted. Extensive right  coronary artery calcification. Global cardiac size within normal limits. No pericardial effusion. Small gastroesophageal varices are identified. Hepatobiliary: There is hypertrophy of the left hepatic lobe and caudate lobe and the liver contour is mildly nodular in keeping with changes of cirrhosis. Recanalization of the umbilical vein is noted. No definite focal intrahepatic mass identified on this noncontrast examination. No definite intra or extrahepatic biliary ductal dilation. Gallbladder unremarkable. Pancreas: Unremarkable Spleen: Unremarkable Adrenals/Urinary Tract: The adrenal glands are unremarkable. The right kidney is markedly atrophic. The left kidney is normal in size and position and demonstrates preserved cortical thickness. There is no hydronephrosis. Extensive vascular calcifications are noted within the renal hilum. Mild nonspecific perinephric stranding is present. No loculated pararenal fluid collections identified. No ureteral calculi. The bladder is unremarkable. Stomach/Bowel: The stomach is fluid-filled, as is the duodenum. There is asymmetric mesenteric edema and circumferential bowel wall thickening involving the proximal jejunum which appears mildly dilated suggesting changes of a a infectious or inflammatory focal enteritis with resultant partial small bowel obstruction. A discrete focal transition point is not identified. The remainder of the small bowel is unremarkable. There is large volume stool within the distal colon and rectal vault. No free intraperitoneal gas or fluid. The appendix is unremarkable. Vascular/Lymphatic: Extensive aortoiliac atherosclerotic calcification is present. Particularly prominent atherosclerotic calcification is seen within the a proximal renal arteries and mesenteric arteries with almost certain hemodynamically significant stenosis of the celiac axis and superior mesenteric artery proximally, though this is not well assessed on this noncontrast  examination. Bilateral lower extremity arterial inflow stenting has been performed. Inferior vena cava filter is seen within the infrarenal cava. No pathologic adenopathy within the abdomen and pelvis. Reproductive: Prostate is unremarkable. Other: There is infiltrative change within the right inguinal region superficial to the a common femoral artery. This may represent the sequela of a recent vascular intervention with resultant hematoma, a local inflammatory process such as a developing abscess or suppurative lymph node, or an infiltrative mass. This is not well assessed on this noncontrast examination. This measures 2.4 x 3.2 cm on axial image # 77 Musculoskeletal: No lytic or blastic bone lesions are seen. IMPRESSION: Bibasilar pulmonary infiltrates and airway impaction most in keeping with changes of acute infection or aspiration. Given the findings below, aspiration is favored. Circumferential bowel wall thickening and focal mesenteric edema involving the proximal jejunum just beyond the ligament of Treitz suggesting an underlying infectious or inflammatory enteritis. Focal ischemia would be unusual in this location, however, given the degree of vascular disease, segmental vascular thrombosis and/or distal embolization could also result  in a similar appearance. Resultant partial small bowel obstruction with fluid-filled duodenum and stomach. Nasogastric intubation may be helpful for further management. Peripheral vascular disease with extensive calcification and almost certain hemodynamically significant stenosis involving the proximal mesenteric arterial vasculature. The degree of stenosis would be better assessed with CT arteriography, if indicated. Infiltrative change within the right inguinal region, possibly posttraumatic, infectious, or neoplastic. This could be further assessed with dedicated sonography. Aortic Atherosclerosis (ICD10-I70.0). Electronically Signed   By: Helyn NumbersAshesh  Parikh MD   On:  11/27/2020 22:18   DG Chest Port 1 View  Result Date: 11/19/2020 CLINICAL DATA:  Shortness of breath.  COVID positive. EXAM: PORTABLE CHEST 1 VIEW COMPARISON:  November 18, 2020 FINDINGS: The heart size and mediastinal contours are within normal limits. Patchy consolidations identified in bilateral lung bases worse compared prior exam. There are probable small bilateral pleural effusions. There is no pulmonary edema. The visualized skeletal structures are stable. IMPRESSION: Patchy consolidations identified in bilateral lung bases worse compared prior exam, consistent with pneumonia. Electronically Signed   By: Sherian ReinWei-Chen  Lin M.D.   On: 11/19/2020 14:21   DG Chest Port 1 View  Result Date: 11/28/2020 CLINICAL DATA:  Sepsis EXAM: PORTABLE CHEST 1 VIEW COMPARISON:  12/18/2019 FINDINGS: Cardiac shadow is stable. Aortic calcifications are again seen. Mild bibasilar atelectatic changes are noted. No focal infiltrate or sizable effusion is seen. Skin fold is noted over the lateral chest bilaterally. No bony abnormality is seen. IMPRESSION: Mild bibasilar atelectasis. Electronically Signed   By: Alcide CleverMark  Lukens M.D.   On: 10/29/2020 19:13    Coagulopathy [facility](HCC) #75 year old male patient with multiple medical problems-COPD hypertension cirrhosis CAD-A. fib on Xarelto currently admitted to with hospital for Covid infection/small bowel obstruction s-noted to have elevated PT PTT/D-dimer levels.  #Elevated PT PTT-multifactorial-secondary Xarelto/antibiotics/malnutrition/underlying cirrhosis/sepsis. No evidence of any acute liver failure-AST ALT bilirubin normal question DIC.  ##Sepsis -question etiology intra-abdominal source versus COVID positive.  Partial small bowel obstruction-as per primary team/surgery.  Recommendations:  #Recommend peripheral smear/LDH; checking fibrinogen levels. If fibrinogen levels are low [less than 100]-would recommend cryoprecipitate. However, if adequate fibrinogen-I would suspect  patient's elevated PT PTT is not from DIC; but from other causes mentioned above. Await above work-up.  Thank you Dr.Amery for allowing me to participate in the care of your pleasant patient. Please do not hesitate to contact me with questions or concerns in the interim.   All questions were answered. The patient knows to call the clinic with any problems, questions or concerns.    Earna CoderGovinda R Chihiro Frey, MD 11/21/2020 12:47 PM

## 2020-11-21 NOTE — Progress Notes (Signed)
PROGRESS NOTE    Stanley Nguyen  FGH:[id number] DOB: 1946/02/27 DOA: 11/20/2020 PCP: Jodi Marble, MD    Brief Narrative:   HPI: Stanley Nguyen is a 75 y.o. male with medical history significant for A. fib on Xarelto, seizure disorder on Vimpat, alcoholic cirrhosis(stopped drinking 5 years ago), CAD, COPD, HTN, unvaccinated against COVID, who was in his usual state of health until 5 days ago when he stopped eating.  Most of the history is taken from the wife over the phone due to the patient's change in mental status. He had a cough and shortness of breath. CT scan of the abdomen done was worrisome for partial small bowel obstruction.  He was seen by the general surgery and advised NG tube.  However during the NG tube placement he vomited and may have aspirated and developed worsening tachypnea.  1/22-pt was confused and quiet for me this am. nsg later reported pt "ate stool possibly", vomited, could not place NGT. Now pt very restless and anxious with increase RR. cxr ordered pending.  1/23-patient confused this AM.  Seen in ER.  Moans when I call his name.  He has urinated on himself.  Per nursing they tried to change him however he was somewhat combative. 1/24- somnolent.  K 3.1. on HF Consultants:   surgery,.  Critical care  Procedures:  CT abdomen and pelvis 11/10/2020 Bibasilar pulmonary infiltrates and airway impaction most in keeping with changes of acute infection or aspiration. Given the findings below, aspiration is favored.  Circumferential bowel wall thickening and focal mesenteric edema involving the proximal jejunum just beyond the ligament of Treitz suggesting an underlying infectious or inflammatory enteritis. Focal ischemia would be unusual in this location, however, given the degree of vascular disease, segmental vascular thrombosis and/or distal embolization could also result in a similar appearance. Resultant partial small bowel obstruction with fluid-filled  duodenum and stomach. Nasogastric intubation may be helpful for further management.  Peripheral vascular disease with extensive calcification and almost certain hemodynamically significant stenosis involving the proximal mesenteric arterial vasculature. The degree of stenosis would be better assessed with CT arteriography, if indicated.  Infiltrative change within the right inguinal region, possibly posttraumatic, infectious, or neoplastic. This could be further assessed with dedicated sonography.  Aortic Atherosclerosis (ICD10-I70.0).  Antimicrobials:   Cefepime, vancomycin, metronidazole   Subjective: Somnolent. Doesn't even open eyes. On mask o2  Objective: Vitals:   11/21/20 1700 11/21/20 1800 11/21/20 1900 11/21/20 2000  BP: 138/84 (!) 154/100 (!) 144/106 (!) 169/107  Pulse: 86 78 79 88  Resp: (!) 26 (!) 30 (!) 34 (!) 34  Temp: (!) 95.9 F (35.5 C) (!) 95.72 F (35.4 C) (!) 95.72 F (35.4 C) (!) 95.54 F (35.3 C)  TempSrc:      SpO2: 96% 97% 96% 98%  Weight:      Height:        Intake/Output Summary (Last 24 hours) at 11/21/2020 2059 Last data filed at 11/21/2020 2043 Gross per 24 hour  Intake 1455.14 ml  Output 1696 ml  Net -240.86 ml   Filed Weights   11/02/2020 1850  Weight: 68 kg    Examination: Somnolent Rhonchi, no rales Regular S1-S2 no gallops mildly tacky Soft benign decreased bowel sounds No edema Unable to assess neuro exam    Data Reviewed: I have personally reviewed following labs and imaging studies  CBC: Recent Labs  Lab 11/14/2020 1857 11/19/20 0640 11/20/20 0414 11/21/20 0236  WBC 21.6* 17.7* 16.3* 13.1*  NEUTROABS  18.9* 15.1* 14.4* 11.2*  HGB 11.0* 10.1* 10.3* 9.6*  HCT 31.6* 29.7* 29.1* 26.3*  MCV 84.3 85.1 83.4 81.7  PLT 119* 92* 129* 774*   Basic Metabolic Panel: Recent Labs  Lab 11/06/2020 1857 11/19/20 0640 11/19/20 1805 11/20/20 0414 11/20/20 1551 11/21/20 0236  NA 130* 135  --  141  --  146*  K 3.1* 3.0*  3.5 3.1*  --  3.1*  CL 89* 100  --  105  --  114*  CO2 24 24  --  21*  --  21*  GLUCOSE 162* 121*  --  94  --  175*  BUN 29* 31*  --  38*  --  37*  CREATININE 2.12* 1.70*  --  1.66*  --  1.50*  CALCIUM 6.5* 6.1*  --  6.5*  --  6.8*  MG  --   --   --  0.8* 2.1 1.7  PHOS  --   --   --  2.3*  --  1.7*   GFR: Estimated Creatinine Clearance: 42.2 mL/min (A) (by C-G formula based on SCr of 1.5 mg/dL (H)). Liver Function Tests: Recent Labs  Lab 11/16/2020 1857 11/19/20 0640 11/20/20 0414 11/21/20 0236  AST 47* 29 45* 48*  ALT '12 9 11 12  ' ALKPHOS 52 45 47 49  BILITOT 1.5* 1.5* 2.3* 1.9*  PROT 6.3* 5.8* 6.2* 5.6*  ALBUMIN 1.7* 1.5* 1.7* 1.5*   Recent Labs  Lab 11/04/2020 2230  LIPASE 19   No results for input(s): AMMONIA in the last 168 hours. Coagulation Profile: Recent Labs  Lab 11/22/2020 1857 11/20/20 1841 11/21/20 0236  INR 2.4* 3.2* 2.8*   Cardiac Enzymes: No results for input(s): CKTOTAL, CKMB, CKMBINDEX, TROPONINI in the last 168 hours. BNP (last 3 results) No results for input(s): PROBNP in the last 8760 hours. HbA1C: No results for input(s): HGBA1C in the last 72 hours. CBG: Recent Labs  Lab 11/20/20 1436  GLUCAP 101*   Lipid Profile: No results for input(s): CHOL, HDL, LDLCALC, TRIG, CHOLHDL, LDLDIRECT in the last 72 hours. Thyroid Function Tests: No results for input(s): TSH, T4TOTAL, FREET4, T3FREE, THYROIDAB in the last 72 hours. Anemia Panel: No results for input(s): VITAMINB12, FOLATE, FERRITIN, TIBC, IRON, RETICCTPCT in the last 72 hours. Sepsis Labs: Recent Labs  Lab 11/07/2020 1857 11/27/2020 2230 11/19/20 0640  PROCALCITON  --   --  3.00  LATICACIDVEN 4.7* 2.6*  --     Recent Results (from the past 240 hour(s))  Blood Culture (routine x 2)     Status: Abnormal (Preliminary result)   Collection Time: 11/24/2020  6:57 PM   Specimen: BLOOD  Result Value Ref Range Status   Specimen Description   Final    BLOOD LEFT ANTECUBITAL Performed at  [facility], [address] [place], [place] [address]    Special Requests   Final    BOTTLES DRAWN AEROBIC AND ANAEROBIC Blood Culture results may not be optimal due to an inadequate volume of blood received in culture bottles Performed at [facility], [address], [place], [place] [address]    Culture  Setup Time   Final    GRAM POSITIVE COCCI IN BOTH AEROBIC AND ANAEROBIC BOTTLES CRITICAL RESULT CALLED TO, READ BACK BY AND VERIFIED WITH: LISA KLUTZ AT 1903 11/19/20.MF    Culture (A)  Final    STAPHYLOCOCCUS EPIDERMIDIS STAPHYLOCOCCUS CAPITIS SUSCEPTIBILITIES TO FOLLOW FOR STAPHYLOCOCCUS EPIDERMIDIS Performed at [facility], [place] [address], [place], [place]  [address]    Report Status PENDING  Incomplete  Blood Culture ID Panel (Reflexed)     Status: Abnormal   Collection Time: 11/13/2020  6:57 PM  Result Value Ref Range Status   Enterococcus faecalis NOT DETECTED NOT DETECTED Final   Enterococcus Faecium NOT DETECTED NOT DETECTED Final   Listeria monocytogenes NOT DETECTED NOT DETECTED Final   Staphylococcus species DETECTED (A) NOT DETECTED Final    Comment: CRITICAL RESULT CALLED TO, READ BACK BY AND VERIFIED WITH: LISA Kittle AT 1903 11/19/20. MF    Staphylococcus aureus (BCID) NOT DETECTED NOT DETECTED Final   Staphylococcus epidermidis DETECTED (A) NOT DETECTED Final    Comment: Methicillin (oxacillin) resistant coagulase negative staphylococcus. Possible blood culture contaminant (unless isolated from more than one blood culture draw or clinical case suggests pathogenicity). No antibiotic treatment is indicated for blood  culture contaminants. CRITICAL RESULT CALLED TO, READ BACK BY AND VERIFIED WITH:  LISA Swager AT 1903 11/19/20. MF    Staphylococcus lugdunensis NOT DETECTED NOT DETECTED Final   Streptococcus species NOT DETECTED NOT DETECTED Final   Streptococcus agalactiae NOT DETECTED NOT DETECTED Final   Streptococcus pneumoniae NOT DETECTED  NOT DETECTED Final   Streptococcus pyogenes NOT DETECTED NOT DETECTED Final   A.calcoaceticus-baumannii NOT DETECTED NOT DETECTED Final   Bacteroides fragilis NOT DETECTED NOT DETECTED Final   Enterobacterales NOT DETECTED NOT DETECTED Final   Enterobacter cloacae complex NOT DETECTED NOT DETECTED Final   Escherichia coli NOT DETECTED NOT DETECTED Final   Klebsiella aerogenes NOT DETECTED NOT DETECTED Final   Klebsiella oxytoca NOT DETECTED NOT DETECTED Final   Klebsiella pneumoniae NOT DETECTED NOT DETECTED Final   Proteus species NOT DETECTED NOT DETECTED Final   Salmonella species NOT DETECTED NOT DETECTED Final   Serratia marcescens NOT DETECTED NOT DETECTED Final   Haemophilus influenzae NOT DETECTED NOT DETECTED Final   Neisseria meningitidis NOT DETECTED NOT DETECTED Final   Pseudomonas aeruginosa NOT DETECTED NOT DETECTED Final   Stenotrophomonas maltophilia NOT DETECTED NOT DETECTED Final   Candida albicans NOT DETECTED NOT DETECTED Final   Candida auris NOT DETECTED NOT DETECTED Final   Candida glabrata NOT DETECTED NOT DETECTED Final   Candida krusei NOT DETECTED NOT DETECTED Final   Candida parapsilosis NOT DETECTED NOT DETECTED Final   Candida tropicalis NOT DETECTED NOT DETECTED Final   Cryptococcus neoformans/gattii NOT DETECTED NOT DETECTED Final   Methicillin resistance mecA/C DETECTED (A) NOT DETECTED Final    Comment: CRITICAL RESULT CALLED TO, READ BACK BY AND VERIFIED WITH: LISA Haddox AT 1903 11/19/20. MF Performed at [facility], [address] [place], [place] [address]   Blood Culture (routine x 2)     Status: Abnormal (Preliminary result)   Collection Time: 11/08/2020  6:58 PM   Specimen: BLOOD  Result Value Ref Range Status   Specimen Description   Final    BLOOD RIGHT ANTECUBITAL Performed at [facility], [address], [place], [place] [address]    Special Requests   Final    BOTTLES DRAWN AEROBIC AND ANAEROBIC Blood Culture  adequate volume Performed at [facility], [address] [place], [place] [address]    Culture  Setup Time   Final    GRAM POSITIVE COCCI IN BOTH AEROBIC AND ANAEROBIC BOTTLES CRITICAL VALUE NOTED.  VALUE IS CONSISTENT WITH PREVIOUSLY REPORTED AND CALLED VALUE. Performed at [facility], [address], [place],  [address]    Culture STAPHYLOCOCCUS EPIDERMIDIS (A)  Final  Report Status PENDING  Incomplete  Urine culture     Status: Abnormal   Collection Time: 11/05/2020  6:58 PM   Specimen: Urine, Random  Result Value Ref Range Status   Specimen Description   Final    URINE, RANDOM Performed at [facility], [address] [place], [place] [address]    Special Requests   Final    NONE Performed at [facility], [address] [place], [place] [address]    Culture MULTIPLE SPECIES PRESENT, SUGGEST RECOLLECTION (A)  Final   Report Status 11/20/2020 FINAL  Final  Gastrointestinal Panel by PCR , Stool     Status: None   Collection Time: 11/19/20  6:40 AM   Specimen: Stool  Result Value Ref Range Status   Campylobacter species NOT DETECTED NOT DETECTED Final   Plesimonas shigelloides NOT DETECTED NOT DETECTED Final   Salmonella species NOT DETECTED NOT DETECTED Final   Yersinia enterocolitica NOT DETECTED NOT DETECTED Final   Vibrio species NOT DETECTED NOT DETECTED Final   Vibrio cholerae NOT DETECTED NOT DETECTED Final   Enteroaggregative E coli (EAEC) NOT DETECTED NOT DETECTED Final   Enteropathogenic E coli (EPEC) NOT DETECTED NOT DETECTED Final   Enterotoxigenic E coli (ETEC) NOT DETECTED NOT DETECTED Final   Shiga like toxin producing E coli (STEC) NOT DETECTED NOT DETECTED Final   Shigella/Enteroinvasive E coli (EIEC) NOT DETECTED NOT DETECTED Final   Cryptosporidium NOT DETECTED NOT DETECTED Final   Cyclospora cayetanensis NOT DETECTED NOT DETECTED Final   Entamoeba histolytica NOT DETECTED NOT DETECTED Final   Giardia  lamblia NOT DETECTED NOT DETECTED Final   Adenovirus F40/41 NOT DETECTED NOT DETECTED Final   Astrovirus NOT DETECTED NOT DETECTED Final   Norovirus GI/GII NOT DETECTED NOT DETECTED Final   Rotavirus A NOT DETECTED NOT DETECTED Final   Sapovirus (I, II, IV, and V) NOT DETECTED NOT DETECTED Final    Comment: Performed at [facility], [address] [place], [place] [address]  MRSA PCR Screening     Status: Abnormal   Collection Time: 11/20/20  3:00 PM   Specimen: Nasal Mucosa; Nasopharyngeal  Result Value Ref Range Status   MRSA by PCR POSITIVE (A) NEGATIVE Final    Comment:        The GeneXpert MRSA Assay (FDA approved for NASAL specimens only), is one component of a comprehensive MRSA colonization surveillance program. It is not intended to diagnose MRSA infection nor to guide or monitor treatment for MRSA infections. RESULT CALLED TO, READ BACK BY AND VERIFIED WITH: TY Martinique '@[email or web address]'  ON 11/20/20 SKL Performed at [facility], [address], [place], [place] [address]   Urine Culture     Status: None (Preliminary result)   Collection Time: 11/20/20  5:30 PM   Specimen: Urine, Catheterized  Result Value Ref Range Status   Specimen Description   Final    URINE, CATHETERIZED Performed at [facility], [address], [place], [place] [address]    Special Requests   Final    NONE Performed at [facility], [place] [address] [place],  [address]    Culture PENDING  Incomplete   Report Status PENDING  Incomplete  CULTURE, BLOOD (ROUTINE X 2) w Reflex to ID Panel     Status: None (Preliminary result)   Collection Time: 11/20/20  6:40 PM   Specimen: BLOOD  Result Value Ref Range Status   Specimen Description BLOOD BLOOD LEFT FOREARM  Final  Special Requests   Final    BOTTLES DRAWN AEROBIC AND ANAEROBIC Blood Culture adequate volume   Culture   Final    NO GROWTH < 12 HOURS Performed at [facility], [address]  [place], [place] [address]    Report Status PENDING  Incomplete  CULTURE, BLOOD (ROUTINE X 2) w Reflex to ID Panel     Status: None (Preliminary result)   Collection Time: 11/20/20  6:41 PM   Specimen: BLOOD  Result Value Ref Range Status   Specimen Description BLOOD BLOOD LEFT WRIST  Final   Special Requests   Final    BOTTLES DRAWN AEROBIC AND ANAEROBIC Blood Culture adequate volume   Culture   Final    NO GROWTH < 12 HOURS Performed at [facility], [address], [place],  [address]    Report Status PENDING  Incomplete         Radiology Studies: No results found.      Scheduled Meds: . acidophilus  2 capsule Oral Daily  . Chlorhexidine Gluconate Cloth  6 each Topical Daily  . enoxaparin (LOVENOX) injection  40 mg Subcutaneous Q24H  . methylPREDNISolone (SOLU-MEDROL) injection  40 mg Intravenous Q12H  . midodrine  2.5 mg Oral TID WC  . mupirocin ointment  1 application Nasal BID  . sodium phosphate  1 enema Rectal Once   Continuous Infusions: . ampicillin-sulbactam (UNASYN) IV 3 g (11/21/20 1859)  . dextrose 5 % and 0.45 % NaCl with KCl 20 mEq/L 100 mL/hr at 11/21/20 1436  . lacosamide (VIMPAT) IV 50 mg (11/21/20 1000)  . potassium PHOSPHATE IVPB (in mmol)    . remdesivir 100 mg in NS 100 mL Stopped (11/21/20 2000)  . vancomycin 1,000 mg (11/21/20 1611)    Assessment & Plan:   Principal Problem:   Severe sepsis ([place]) Active Problems:   Acute encephalopathy   Pneumonia   Chronic atrial fibrillation (HCC)   Chronic pain   Peripheral vascular disease, unspecified (HCC)   HTN (hypertension)   Cirrhosis (HCC)   COPD with chronic bronchitis (HCC)   CAD (coronary artery disease)   Chronic anticoagulation   Chronic, continuous use of opioids   AKI (acute kidney injury) ([place])   Hyponatremia   Elevated troponin   Acute metabolic encephalopathy   COVID-19 virus infection   Acute respiratory failure with hypoxia (HCC)   Seizure disorder (HCC)    Acute gastroenteritis   Partial small bowel obstruction (HCC)   Pneumonia due to COVID-19 virus   Coagulopathy ([place])   75 year old male with a history of A. fib on Xarelto, seizure disorder on Vimpat, alcoholic cirrhosis(stopped drinking 5 years ago), CAD, COPD, HTN, unvaccinated against COVID, presenting decreased appetite x 5 days and I day of nausea and vomiting and abdominal pain and altered mental status.  Met severe sepsis criteria and tested positive for covid.    Severe sepsis/septic shock ([place]) - Patient meeting severe sepsis criteria with fever, hypotension, leukocytosis with lactic acid 4.7 altered mental status, AKI and acute respiratory failure - Septic source uncertain: COVID+,  urinalysis sterile,.  Suspecting intra-abdominal source due to vomiting and diarrhea Procalcitonin elevated. See CT A/P results above Chest x-ray yesterday revealed patchy consolidation bilateral lungs worse than prior exam which is likely consistent to aspiration since he vomited Positive for Covid , not vaccinated, started on remdesivir and Solu-Medrol To multiple organisms recommend recollection Blood cultures with staph epi, MRSA detected ID consulted and input was appreciated, 4/4 staph epid.  Bacteremia possibly be from skin tears eventhough dont appear to be infected Continue vancomycin Staph capitis in one of the cultures contaminant Would repeat blood cultures 2D echo Added probiotics since on abx Midodrine was started due to low bp Also with aspiration pneumonia was started on cefepime and Flagyl, switch to Unasyn PCCM consulted as pt is full code and risk for intubation      Acute metabolic encephalopathy- due to sepsis/covid/shock/aspiration pneumonia. MS at baseline appropriate per family and this is new Continue neurochecks Treated underlying sepsis Following aspiration precautions Fall and aspiration precautions         Acute respiratory failure with hypoxia ([place])    Possible covid PNA v.s. Aspiration PNA. Repeat cxr from 1/22 with worsening b/l consolidation Possibly both, but suspect more aspiration PNA PCCM consulted since patient at risk of intubation 1/24-was started on cefepime and Flagyl, will switch to Unasyn Continue IV treatment for Covid, random severe and steroids       Aspiration PNA Still tachypnic this am.  Continue npo for now due to intra abd issues Switch to unasyn as above Aspiration precautions     Coagulopathy Elevated fibrin derivatives INR elevated at 2.8 Concern for DIC...> hematology was consulted, input was appreciated.-likely multifactorial secondary to Xarelto, antibiotics, malnutrition, underlying cirrhosis/sepsis. No evidence of any acute liver failure as AST and ALT bili normal       AKI (acute kidney injury) (HCC)-prerenal - Secondary to vomiting and diarrhea as well as severe sepsis Improving with IV fluids, creatinine 1.50 Avoid nephrotoxic medication and renally dose other meds Continue to monitor levels      Acute gastroenteritis, possibly COVID-related - Patient presents with a several hour history of vomiting, diarrhea and abdominal pain - Possibly COVID gastroenteritis- inflammatory v.s. infectious. 1/22-see CT a/p report Support care   Partial SBO on CT abdomen and pelvis - CT abdomen resulted after admission outlined above showing partial small bowel obstruction with fluid-filled duodenum and stomach 1/22-NGT to be placed, per nsg unable to place , pt vomited during placement and was fightening the tube. 1/24-multiple BM.  gsx following. Recommend continue NPO.  Per surgery can consider CT angio if worsening abdominal pain or persistent nausea and vomiting but again and/V/D with no obvious complaints of abdominal pain is not consistent with acute ischemic bowel     Hyponatremia - Serum sodium 130>>>135>>>141>>146 Improved with ivf hydration     Elevated troponin, likely demand  ischemia   History of CAD 1/22-TP trending down.   echo -pending     Chronic atrial fibrillation (HCC)   Chronic anticoagulation - Hold rate control agent due to hypotension on admission - ?Alen Blew, unsure if pt was taking this.    Chronic pain on chronic opiates - Holding OxyContin for now due to altered mental status - As needed short acting pain meds as needed  Hypotension with history of HTN (hypertension) - Hold home antihypertensives for now due to hypotension from severe sepsis and resume as appropriate Stared on midodrine, will continue  Alcoholic cirrhosis (HCC) - Appears stable.  No alcohol use in 5 years    COPD with chronic bronchitis ([place]) - Not acutely exacerbated    COVID-positive status - Possibly causing acute gastroenteritis as above, possibly incidental -see above about respiratory status. -crp and fibrin derv. Elevated. tx with Remdesivir and iv steroid     Seizure disorder ([place]) - Continue Vimpat - Ativan as needed.  IV substitution for Vimpat   Palliative care consulted.  DVT prophylaxis: lovenox Code Status:full Family Communication: updated wife and daughter  Status is: Inpatient  Remains inpatient appropriate because:IV treatments appropriate due to intensity of illness or inability to take PO   Dispo: The patient is from: Home              Anticipated d/c is to: Home              Anticipated d/c date is: > 3 days              Patient currently is not medically stable to d/c.Px guarded, at risk of intubation and possible death.   Difficult to place patient No            LOS: 3 days   Time spent: 45 minutes with more than 50% on [place], MD [facility] Pager [phone]  If 7PM-7AM, please contact night-coverage 11/21/2020, 8:59 PM

## 2020-11-21 NOTE — Progress Notes (Signed)
Subjective:  CC: Stanley Nguyen is a 75 y.o. male  Hospital stay day 3,   pSBO  HPI: Still confused. Another BM recorded.  No report of emesis.   ROS:  Unable to obtain secondary to patient refusal to answer questions  Objective:   Temp:  [95.54 F (35.3 C)-97.7 F (36.5 C)] 95.54 F (35.3 C) (01/24 2000) Pulse Rate:  [75-88] 88 (01/24 2000) Resp:  [20-35] 34 (01/24 2000) BP: (97-169)/(73-131) 169/107 (01/24 2000) SpO2:  [93 %-98 %] 98 % (01/24 2000)     Height: 5\' 10"  (177.8 cm) Weight: 68 kg BMI (Calculated): 21.52   Intake/Output this shift:   Intake/Output Summary (Last 24 hours) at 11/21/2020 2046 Last data filed at 11/21/2020 2043 Gross per 24 hour  Intake 1455.14 ml  Output 1696 ml  Net -240.86 ml    Constitutional :  mild distress and uncooperative  Respiratory:  clear to auscultation bilaterally  Cardiovascular:  regular rate and rhythm  Gastrointestinal: soft, non-tender; bowel sounds normal; no masses,  no organomegaly.   Skin: Cool and moist. Right groin boil erythema resolved, wound essentially healed, with no additional drainage.  Healing ridge palpable  Psychiatric: Confused, in fetal position an unable to answer coherently       LABS:  CMP Latest Ref Rng & Units 11/21/2020 11/20/2020 11/19/2020  Glucose 70 - 99 mg/dL 11/21/2020) 94 -  BUN 8 - 23 mg/dL 703(J) 00(X) -  Creatinine 0.61 - 1.24 mg/dL 38(H) 8.29(H) -  Sodium 135 - 145 mmol/L 146(H) 141 -  Potassium 3.5 - 5.1 mmol/L 3.1(L) 3.1(L) 3.5  Chloride 98 - 111 mmol/L 114(H) 105 -  CO2 22 - 32 mmol/L 21(L) 21(L) -  Calcium 8.9 - 10.3 mg/dL 3.71(I) 6.5(L) -  Total Protein 6.5 - 8.1 g/dL 9.6(V) 6.2(L) -  Total Bilirubin 0.3 - 1.2 mg/dL 8.9(F) 2.3(H) -  Alkaline Phos 38 - 126 U/L 49 47 -  AST 15 - 41 U/L 48(H) 45(H) -  ALT 0 - 44 U/L 12 11 -   CBC Latest Ref Rng & Units 11/21/2020 11/20/2020 11/19/2020  WBC 4.0 - 10.5 K/uL 13.1(H) 16.3(H) 17.7(H)  Hemoglobin 13.0 - 17.0 g/dL 11/21/2020) 10.3(L) 10.1(L)  Hematocrit  39.0 - 52.0 % 26.3(L) 29.1(L) 29.7(L)  Platelets 150 - 400 K/uL 107(L) 129(L) 92(L)    RADS: n/a Assessment:   PSBO.  More episodes of BM reported, no reported further N/V.  Resolving.  Further unlikely to be ischemic bowel, but can consider formal CT angio due to previous report if any change in GI issues. Advance diet as tolerated if patient able to safely eat and drink   Right groin area boil resolving now.  Again, doubt it was ever significant enough to cause the septicemia noted.  No further intervention necessary.  Further mangement per medical team for now.  Surgery to sign off.

## 2020-11-21 NOTE — Consult Note (Addendum)
PHARMACY CONSULT NOTE  Pharmacy Consult for Electrolyte Monitoring and Replacement   Recent Labs: Potassium (mmol/L)  Date Value  11/21/2020 3.1 (L)  08/04/2013 4.4   Magnesium (mg/dL)  Date Value  40/81/4481 1.7  08/04/2013 1.2 (L)   Calcium (mg/dL)  Date Value  85/63/1497 6.8 (L)   Calcium, Total (mg/dL)  Date Value  02/63/7858 7.6 (L)   Albumin (g/dL)  Date Value  85/11/7739 1.5 (L)  08/03/2013 1.4 (L)   Phosphorus (mg/dL)  Date Value  28/78/6767 1.7 (L)   Sodium (mmol/L)  Date Value  11/21/2020 146 (H)  08/04/2013 132 (L)   Corrected calcium: 8.8 mg/dL  Assessment: Patient is a 75 y/o M with medical history including Afib on Xarelto, seizure disorder on Vimpat, cirrhosis, CAD, COPD, HTN who was admitted with altered mental status, septic shock, COVID +, acute respiratory failure, partial SBO. Pharmacy consulted to assist with electrolyte monitoring and replacement as warranted. Patient is currently NPO.   MIVF: D5 1/2 NS + 20 mEq K/L at 100 mL/hr (48 mEq K/day)  Goal of Therapy:  Electrolytes within normal limits  Plan:  1/24 AM labs: Na 146, K 3.1, Phos 1.7, Mg 1.7 --Continue MIVF to provide 48 mEq K/day --Will order IV magnesium sulfate 2 g x 1 --Will order IV potassium phosphate 20 mmol x 1 (contains 30 mEq K+) --Re-check electrolytes tomorrow AM  Tressie Ellis 11/21/2020 3:46 PM

## 2020-11-21 NOTE — Consult Note (Signed)
NAME: Stanley Nguyen  DOB: [birth year]  MRN: [id number]  Date/Time: 11/21/2020 1:46 PM  REQUESTING PROVIDER: Dr. Allena Napoleon Subjective:  REASON FOR CONSULT: staph epi bacteremia ?No history available from patient . Chart reviewed Stanley Nguyen is a 75 y.o. with a history of afib on xarelto, CAD, copd,alcoholic cirrhosis, amputation toe Presented on 11/02/2020 with shortness of breath, weakness and altered mental status from home. In the ED vitals BP 121/95, HR 88, RR 24, Pulse ox 95% Labs revealed a lactate of 4.7, NA 130, K 3.1, BUN 29, cr 2.12, WBC 21.6, Hb 11, plt 119 covid positive BC staph epi rt and left arm 4/4 Staph capitis left arm CT chest bibasilar infiltrate CT abdomen Circumferential bowel wall thickening and focal mesenteric edema involving the proximal jejunum just beyond the ligament of Treitz suggesting an underlying infectious or inflammatory enteritis. He tested positive for covid and started on remdisivir and solumedrol I am seeing the patient for the staph epidermidis bacteremia Pt apparently had gone to his PCP the day befor ehe came to the hospital an dwas diagnosed as pneumonia Past Medical History:  Diagnosis Date  . Atrial fibrillation (HCC)   . CAD (coronary artery disease)   . COPD (chronic obstructive pulmonary disease) (HCC)   . Dysrhythmia    af. patient unaware of a diagnosis of a fib  . Gangrene (HCC) 09/27/2017   left foot/toes  . Hypertension   . Liver cirrhosis (HCC)   . Liver cirrhosis (HCC)   . Osteomyelitis (HCC) 2018   right ankle and foot  . Peripheral vascular disease (HCC)   . Pre-diabetes   . Seizure disorder (HCC) 2015   last seizure 2-3 years ago. takes vimpat    Past Surgical History:  Procedure Laterality Date  . AMPUTATION TOE Left 09/27/2017   Procedure: AMPUTATION TOE-LEFT GREAT TOE, LEFT SECOND TOE;  Surgeon: Gwyneth Revels, DPM;  Location: [facility];  Service: Podiatry;  Laterality: Left;  . CARDIAC CATHETERIZATION     no stents  .  ENDARTERECTOMY FEMORAL Right 07/17/2017   Procedure: ENDARTERECTOMY FEMORAL;  Surgeon: Annice Needy, MD;  Location: [facility];  Service: Vascular;  Laterality: Right;  . HERNIA REPAIR  1998   umbilical  . IRRIGATION AND DEBRIDEMENT FOOT Right 07/12/2017   Procedure: IRRIGATION AND DEBRIDEMENT FOOT;  Surgeon: Gwyneth Revels, DPM;  Location: [facility];  Service: Podiatry;  Laterality: Right;  . LOWER EXTREMITY ANGIOGRAM Right 07/17/2017   Procedure: LOWER EXTREMITY ANGIOGRAM ( FSA STENT PLACEMENT );  Surgeon: Annice Needy, MD;  Location: [facility];  Service: Vascular;  Laterality: Right;  . LOWER EXTREMITY ANGIOGRAPHY Right 07/15/2017   Procedure: Lower Extremity Angiography;  Surgeon: Annice Needy, MD;  Location: [facility];  Service: Cardiovascular;  Laterality: Right;  . LOWER EXTREMITY ANGIOGRAPHY Left 09/11/2017   Procedure: Lower Extremity Angiography;  Surgeon: Annice Needy, MD;  Location: [facility];  Service: Cardiovascular;  Laterality: Left;  . LOWER EXTREMITY ANGIOGRAPHY Left 09/13/2017   Procedure: Lower Extremity Angiography;  Surgeon: Annice Needy, MD;  Location: [facility];  Service: Cardiovascular;  Laterality: Left;  . LOWER EXTREMITY INTERVENTION  09/11/2017   Procedure: LOWER EXTREMITY INTERVENTION;  Surgeon: Annice Needy, MD;  Location: [facility];  Service: Cardiovascular;;    Social History   Socioeconomic History  . Marital status: Married    Spouse name: Not on file  . Number of children: Not on file  . Years of  education: Not on file  . Highest education level: Not on file  Occupational History  . Not on file  Tobacco Use  . Smoking status: Former Smoker    Packs/day: 1.00    Types: Cigarettes    Quit date: 09/04/2017    Years since quitting: 3.2  . Smokeless tobacco: Never Used  . Tobacco comment: still smokes but less  Vaping Use  . Vaping Use: Never used  Substance and Sexual Activity  . Alcohol use: No    Comment:  previously a drinker. stopped 6 years ago.  . Drug use: No  . Sexual activity: Not on file  Other Topics Concern  . Not on file  Social History Narrative  . Not on file   Social Determinants of Health   Financial Resource Strain: Not on file  Food Insecurity: Not on file  Transportation Needs: Not on file  Physical Activity: Not on file  Stress: Not on file  Social Connections: Not on file  Intimate Partner Violence: Not on file    No family history on file. Allergies  Allergen Reactions  . Keppra [Levetiracetam] Rash    ? Current Facility-Administered Medications  Medication Dose Route Frequency Provider Last Rate Last Admin  . acetaminophen (TYLENOL) tablet 650 mg  650 mg Oral Q6H PRN Andris Baumannuncan, Hazel V, MD       Or  . acetaminophen (TYLENOL) suppository 650 mg  650 mg Rectal Q6H PRN Andris Baumannuncan, Hazel V, MD      . acidophilus (RISAQUAD) capsule 2 capsule  2 capsule Oral Daily Amery, Sahar, MD      . ceFEPIme (MAXIPIME) 2 g in sodium chloride 0.9 % 100 mL IVPB  2 g Intravenous Q12H Lowella BandyGrubb, Rodney D, RPH 200 mL/hr at 11/21/20 0901 2 g at 11/21/20 0901  . Chlorhexidine Gluconate Cloth 2 % PADS 6 each  6 each Topical Daily Lynn ItoAmery, Sahar, MD   6 each at 11/20/20 1430  . chlorpheniramine-HYDROcodone (TUSSIONEX) 10-8 MG/5ML suspension 5 mL  5 mL Oral Q12H PRN Lindajo Royaluncan, Hazel V, MD      . dextrose 5 % and 0.45 % NaCl with KCl 20 mEq/L infusion   Intravenous Continuous Kasa, Kurian, MD      . enoxaparin (LOVENOX) injection 40 mg  40 mg Subcutaneous Q24H Andris Baumannuncan, Hazel V, MD   40 mg at 11/21/20 0923  . guaiFENesin-dextromethorphan (ROBITUSSIN DM) 100-10 MG/5ML syrup 10 mL  10 mL Oral Q4H PRN Andris Baumannuncan, Hazel V, MD      . lacosamide (VIMPAT) 50 mg in sodium chloride 0.9 % 25 mL IVPB  50 mg Intravenous Q12H Lindajo Royaluncan, Hazel V, MD 60 mL/hr at 11/21/20 1000 50 mg at 11/21/20 1000  . LORazepam (ATIVAN) injection 2 mg  2 mg Intravenous Q2H PRN Lindajo Royaluncan, Hazel V, MD      . methylPREDNISolone sodium succinate  (SOLU-MEDROL) 40 mg/mL injection 40 mg  40 mg Intravenous Q12H Arbie CookeyBashir, Khalid, MD   40 mg at 11/21/20 0503  . metroNIDAZOLE (FLAGYL) IVPB 500 mg  500 mg Intravenous Q8H Lindajo Royaluncan, Hazel V, MD 100 mL/hr at 11/21/20 0753 500 mg at 11/21/20 0753  . midodrine (PROAMATINE) tablet 2.5 mg  2.5 mg Oral TID WC Lynn ItoAmery, Sahar, MD      . mupirocin ointment (BACTROBAN) 2 % 1 application  1 application Nasal BID Lynn ItoAmery, Sahar, MD   1 application at 11/21/20 0902  . ondansetron (ZOFRAN) tablet 4 mg  4 mg Oral Q6H PRN Andris Baumannuncan, Hazel V, MD  Or  . ondansetron (ZOFRAN) injection 4 mg  4 mg Intravenous Q6H PRN Andris Baumann, MD      . remdesivir 100 mg in sodium chloride 0.9 % 100 mL IVPB  100 mg Intravenous Daily Lindajo Royal V, MD 200 mL/hr at 11/21/20 1157 100 mg at 11/21/20 1157  . sodium phosphate (FLEET) 7-19 GM/118ML enema 1 enema  1 enema Rectal Once Arbie Cookey, MD      . vancomycin (VANCOREADY) IVPB 750 mg/150 mL  750 mg Intravenous Q24H Wayland Denis, RPH   Paused at 11/20/20 1819     Abtx:  Anti-infectives (From admission, onward)   Start     Dose/Rate Route Frequency Ordered Stop   11/19/20 1800  ceFEPIme (MAXIPIME) 2 g in sodium chloride 0.9 % 100 mL IVPB  Status:  Discontinued        2 g 200 mL/hr over 30 Minutes Intravenous Every 24 hours 11/19/20 0202 11/19/20 0821   11/19/20 1700  vancomycin (VANCOREADY) IVPB 750 mg/150 mL        750 mg 150 mL/hr over 60 Minutes Intravenous Every 24 hours 11/19/20 0204     11/19/20 1000  remdesivir 100 mg in sodium chloride 0.9 % 100 mL IVPB       "Followed by" Linked Group Details   100 mg 200 mL/hr over 30 Minutes Intravenous Daily 2020/11/29 2118 11/23/20 0959   11/19/20 1000  ceFEPIme (MAXIPIME) 2 g in sodium chloride 0.9 % 100 mL IVPB        2 g 200 mL/hr over 30 Minutes Intravenous Every 12 hours 11/19/20 0821     Nov 29, 2020 2345  metroNIDAZOLE (FLAGYL) IVPB 500 mg        500 mg 100 mL/hr over 60 Minutes Intravenous Every 8 hours 29-Nov-2020 2118      11-29-20 2130  ceFEPIme (MAXIPIME) 2 g in sodium chloride 0.9 % 100 mL IVPB  Status:  Discontinued        2 g 200 mL/hr over 30 Minutes Intravenous  Once 29-Nov-2020 2118 2020/11/29 2152   2020-11-29 2130  vancomycin (VANCOCIN) IVPB 1000 mg/200 mL premix  Status:  Discontinued        1,000 mg 200 mL/hr over 60 Minutes Intravenous  Once November 29, 2020 2118 11-29-20 2148   2020/11/29 2130  remdesivir 200 mg in sodium chloride 0.9% 250 mL IVPB       "Followed by" Linked Group Details   200 mg 580 mL/hr over 30 Minutes Intravenous Once 11-29-2020 2118 2020-11-29 2252   Nov 29, 2020 2100  vancomycin (VANCOREADY) IVPB 1500 mg/300 mL        1,500 mg 150 mL/hr over 120 Minutes Intravenous  Once 11-29-20 2020 11/19/20 0113   11-29-20 2100  ceFEPIme (MAXIPIME) 2 g in sodium chloride 0.9 % 100 mL IVPB        2 g 200 mL/hr over 30 Minutes Intravenous  Once Nov 29, 2020 2020 11/29/20 2106      REVIEW OF SYSTEMS:  NA ?  Objective:  VITALS:  BP (!) 120/91   Pulse 76   Temp (!) 97.52 F (36.4 C)   Resp (!) 27   Ht 5\' 10"  (1.778 m)   Wt 68 kg   SpO2 96%   BMI 21.52 kg/m  PHYSICAL EXAM:  General:letharic, resp distress, opens eyes on calling his name and nods- non verbal and does not meaningfully follow commands Appears older than his age Head: Normocephalic, without obvious abnormality, atraumatic. Eyes: Conjunctivae clear, anicteric sclerae. Pupils are equal  ENT Nares normal. No drainage or sinus tenderness. Oral cavity- cannot examine Neck: Supple,   Lungs: b/l air entry. Heart: irregular Abdomen: Soft, he grimaces on palpation  Extremities:Left foot- great toe and 2nd toe amputated- site looks well Let ankle skin tear Rt great toe- skin tear Rt groin old scar- a small skin tear Skin: dry skin arms - Thin skin Lymph: Cervical, supraclavicular normal. Neurologic: cannot evaluate in detail due to his mental status Pertinent Labs Lab Results CBC    Component Value Date/Time   WBC 13.1 (H) 11/21/2020  0236   RBC 3.22 (L) 11/21/2020 0236   HGB 9.6 (L) 11/21/2020 0236   HGB 8.1 (L) 08/04/2013 0333   HCT 26.3 (L) 11/21/2020 0236   HCT 24.9 (L) 08/03/2013 0456   PLT 107 (L) 11/21/2020 0236   PLT 142 (L) 08/03/2013 0456   MCV 81.7 11/21/2020 0236   MCV 87 08/03/2013 0456   MCH 29.8 11/21/2020 0236   MCHC 36.5 (H) 11/21/2020 0236   RDW 13.7 11/21/2020 0236   RDW 14.4 08/03/2013 0456   LYMPHSABS 1.0 11/21/2020 0236   LYMPHSABS 2.1 08/03/2013 0456   MONOABS 0.7 11/21/2020 0236   MONOABS 0.6 08/03/2013 0456   EOSABS 0.0 11/21/2020 0236   EOSABS 0.3 08/03/2013 0456   BASOSABS 0.0 11/21/2020 0236   BASOSABS 0.1 08/03/2013 0456    CMP Latest Ref Rng & Units 11/21/2020 11/20/2020 11/19/2020  Glucose 70 - 99 mg/dL 161(W175(H) 94 -  BUN 8 - 23 mg/dL 96(E37(H) 45(W38(H) -  Creatinine 0.61 - 1.24 mg/dL 0.98(J1.50(H) 1.91(Y1.66(H) -  Sodium 135 - 145 mmol/L 146(H) 141 -  Potassium 3.5 - 5.1 mmol/L 3.1(L) 3.1(L) 3.5  Chloride 98 - 111 mmol/L 114(H) 105 -  CO2 22 - 32 mmol/L 21(L) 21(L) -  Calcium 8.9 - 10.3 mg/dL 7.8(G6.8(L) 6.5(L) -  Total Protein 6.5 - 8.1 g/dL 9.5(A5.6(L) 6.2(L) -  Total Bilirubin 0.3 - 1.2 mg/dL 2.1(H1.9(H) 2.3(H) -  Alkaline Phos 38 - 126 U/L 49 47 -  AST 15 - 41 U/L 48(H) 45(H) -  ALT 0 - 44 U/L 12 11 -      Microbiology: Recent Results (from the past 240 hour(s))  Blood Culture (routine x 2)     Status: Abnormal (Preliminary result)   Collection Time: 2021/10/08  6:57 PM   Specimen: BLOOD  Result Value Ref Range Status   Specimen Description   Final    BLOOD LEFT ANTECUBITAL Performed at [facility], [address] [place], [place] [address]    Special Requests   Final    BOTTLES DRAWN AEROBIC AND ANAEROBIC Blood Culture results may not be optimal due to an inadequate volume of blood received in culture bottles Performed at [facility], [address] [place], [place] [address]    Culture  Setup Time   Final    GRAM POSITIVE COCCI IN BOTH AEROBIC AND ANAEROBIC  BOTTLES CRITICAL RESULT CALLED TO, READ BACK BY AND VERIFIED WITH: LISA KLUTZ AT 1903 11/19/20.MF    Culture (A)  Final    STAPHYLOCOCCUS EPIDERMIDIS STAPHYLOCOCCUS CAPITIS SUSCEPTIBILITIES TO FOLLOW FOR STAPHYLOCOCCUS EPIDERMIDIS Performed at [facility], [address] [place], [place] [address]    Report Status PENDING  Incomplete  Blood Culture ID Panel (Reflexed)     Status: Abnormal   Collection Time: 2021/10/08  6:57 PM  Result Value Ref Range Status   Enterococcus faecalis NOT DETECTED NOT DETECTED Final   Enterococcus Faecium NOT DETECTED NOT  DETECTED Final   Listeria monocytogenes NOT DETECTED NOT DETECTED Final   Staphylococcus species DETECTED (A) NOT DETECTED Final    Comment: CRITICAL RESULT CALLED TO, READ BACK BY AND VERIFIED WITH: LISA Weinert AT 1903 11/19/20. MF    Staphylococcus aureus (BCID) NOT DETECTED NOT DETECTED Final   Staphylococcus epidermidis DETECTED (A) NOT DETECTED Final    Comment: Methicillin (oxacillin) resistant coagulase negative staphylococcus. Possible blood culture contaminant (unless isolated from more than one blood culture draw or clinical case suggests pathogenicity). No antibiotic treatment is indicated for blood  culture contaminants. CRITICAL RESULT CALLED TO, READ BACK BY AND VERIFIED WITH:  LISA Finder AT 1903 11/19/20. MF    Staphylococcus lugdunensis NOT DETECTED NOT DETECTED Final   Streptococcus species NOT DETECTED NOT DETECTED Final   Streptococcus agalactiae NOT DETECTED NOT DETECTED Final   Streptococcus pneumoniae NOT DETECTED NOT DETECTED Final   Streptococcus pyogenes NOT DETECTED NOT DETECTED Final   A.calcoaceticus-baumannii NOT DETECTED NOT DETECTED Final   Bacteroides fragilis NOT DETECTED NOT DETECTED Final   Enterobacterales NOT DETECTED NOT DETECTED Final   Enterobacter cloacae complex NOT DETECTED NOT DETECTED Final   Escherichia coli NOT DETECTED NOT DETECTED Final   Klebsiella aerogenes NOT DETECTED NOT DETECTED  Final   Klebsiella oxytoca NOT DETECTED NOT DETECTED Final   Klebsiella pneumoniae NOT DETECTED NOT DETECTED Final   Proteus species NOT DETECTED NOT DETECTED Final   Salmonella species NOT DETECTED NOT DETECTED Final   Serratia marcescens NOT DETECTED NOT DETECTED Final   Haemophilus influenzae NOT DETECTED NOT DETECTED Final   Neisseria meningitidis NOT DETECTED NOT DETECTED Final   Pseudomonas aeruginosa NOT DETECTED NOT DETECTED Final   Stenotrophomonas maltophilia NOT DETECTED NOT DETECTED Final   Candida albicans NOT DETECTED NOT DETECTED Final   Candida auris NOT DETECTED NOT DETECTED Final   Candida glabrata NOT DETECTED NOT DETECTED Final   Candida krusei NOT DETECTED NOT DETECTED Final   Candida parapsilosis NOT DETECTED NOT DETECTED Final   Candida tropicalis NOT DETECTED NOT DETECTED Final   Cryptococcus neoformans/gattii NOT DETECTED NOT DETECTED Final   Methicillin resistance mecA/C DETECTED (A) NOT DETECTED Final    Comment: CRITICAL RESULT CALLED TO, READ BACK BY AND VERIFIED WITH: LISA Santillano AT 1903 11/19/20. MF Performed at [facility], [address] [place], [place] [address]   Blood Culture (routine x 2)     Status: Abnormal (Preliminary result)   Collection Time: 11/16/2020  6:58 PM   Specimen: BLOOD  Result Value Ref Range Status   Specimen Description   Final    BLOOD RIGHT ANTECUBITAL Performed at [facility], [address] [place], [place] [address]    Special Requests   Final    BOTTLES DRAWN AEROBIC AND ANAEROBIC Blood Culture adequate volume Performed at [facility], [address] [place], [place] [address]    Culture  Setup Time   Final    GRAM POSITIVE COCCI IN BOTH AEROBIC AND ANAEROBIC BOTTLES CRITICAL VALUE NOTED.  VALUE IS CONSISTENT WITH PREVIOUSLY REPORTED AND CALLED VALUE. Performed at [facility], [address] [place], [place] [address]    Culture STAPHYLOCOCCUS EPIDERMIDIS (A)  Final    Report Status PENDING  Incomplete  Urine culture     Status: Abnormal   Collection Time: 11/17/2020  6:58 PM   Specimen: Urine, Random  Result Value Ref Range Status   Specimen Description   Final    URINE, RANDOM Performed at [facility],  [address], [place], [place] [address]    Special Requests   Final    NONE Performed at [facility], [address] [place], [place] [address]    Culture MULTIPLE SPECIES PRESENT, SUGGEST RECOLLECTION (A)  Final   Report Status 11/20/2020 FINAL  Final  Gastrointestinal Panel by PCR , Stool     Status: None   Collection Time: 11/19/20  6:40 AM   Specimen: Stool  Result Value Ref Range Status   Campylobacter species NOT DETECTED NOT DETECTED Final   Plesimonas shigelloides NOT DETECTED NOT DETECTED Final   Salmonella species NOT DETECTED NOT DETECTED Final   Yersinia enterocolitica NOT DETECTED NOT DETECTED Final   Vibrio species NOT DETECTED NOT DETECTED Final   Vibrio cholerae NOT DETECTED NOT DETECTED Final   Enteroaggregative E coli (EAEC) NOT DETECTED NOT DETECTED Final   Enteropathogenic E coli (EPEC) NOT DETECTED NOT DETECTED Final   Enterotoxigenic E coli (ETEC) NOT DETECTED NOT DETECTED Final   Shiga like toxin producing E coli (STEC) NOT DETECTED NOT DETECTED Final   Shigella/Enteroinvasive E coli (EIEC) NOT DETECTED NOT DETECTED Final   Cryptosporidium NOT DETECTED NOT DETECTED Final   Cyclospora cayetanensis NOT DETECTED NOT DETECTED Final   Entamoeba histolytica NOT DETECTED NOT DETECTED Final   Giardia lamblia NOT DETECTED NOT DETECTED Final   Adenovirus F40/41 NOT DETECTED NOT DETECTED Final   Astrovirus NOT DETECTED NOT DETECTED Final   Norovirus GI/GII NOT DETECTED NOT DETECTED Final   Rotavirus A NOT DETECTED NOT DETECTED Final   Sapovirus (I, II, IV, and V) NOT DETECTED NOT DETECTED Final    Comment: Performed at [facility], [address] [place], [place] [address]  MRSA PCR Screening      Status: Abnormal   Collection Time: 11/20/20  3:00 PM   Specimen: Nasal Mucosa; Nasopharyngeal  Result Value Ref Range Status   MRSA by PCR POSITIVE (A) NEGATIVE Final    Comment:        The GeneXpert MRSA Assay (FDA approved for NASAL specimens only), is one component of a comprehensive MRSA colonization surveillance program. It is not intended to diagnose MRSA infection nor to guide or monitor treatment for MRSA infections. RESULT CALLED TO, READ BACK BY AND VERIFIED WITH: TY [place] @1911  ON 11/20/20 SKL Performed at [facility], [address], [place], [place]   Urine Culture     Status: None (Preliminary result)   Collection Time: 11/20/20  5:30 PM   Specimen: Urine, Catheterized  Result Value Ref Range Status   Specimen Description   Final    URINE, CATHETERIZED Performed at [facility], [address], [place], [place]    Special Requests   Final    NONE Performed at [facility], [address] [place], [place]    Culture PENDING  Incomplete   Report Status PENDING  Incomplete  CULTURE, BLOOD (ROUTINE X 2) w Reflex to ID Panel     Status: None (Preliminary result)   Collection Time: 11/20/20  6:40 PM   Specimen: BLOOD  Result Value Ref Range Status   Specimen Description BLOOD BLOOD LEFT FOREARM  Final   Special Requests   Final    BOTTLES DRAWN AEROBIC AND ANAEROBIC Blood Culture adequate volume   Culture   Final    NO GROWTH < 12 HOURS Performed at [facility], [address], [place], [place]    Report Status PENDING  Incomplete  CULTURE, BLOOD (ROUTINE X 2) w Reflex to ID Panel     Status: None (Preliminary result)   Collection Time: 11/20/20  6:41 PM   Specimen: BLOOD  Result Value Ref Range Status   Specimen Description BLOOD BLOOD LEFT WRIST  Final   Special Requests   Final    BOTTLES DRAWN AEROBIC AND ANAEROBIC Blood Culture adequate volume   Culture   Final    NO  GROWTH < 12 HOURS Performed at [facility], [address], [place], [place] [address]    Report Status PENDING  Incomplete    IMAGING RESULTS: Bibasilar infiltrate  I have personally reviewed the films  CT abdomen The stomach is fluid-filled, as is the duodenum. There is asymmetric mesenteric edema and circumferential bowel wall thickening involving the proximal jejunum which appears mildly dilated suggesting changes of a a infectious or inflammatory focal enteritis with resultant partial small bowel obstruction? Impression/Recommendation ? ?staph epidermidis bacteremia 4/4 could be from the skin tears eventhough they dont look infected Continue vanco Staph capitis in one of the culture is a contaminant Would repeat blood culture- 2 d echo  Acute hypoxic resp failure with covid 19 and also concern for aspiration pneumonia- change cefepime /flagyl to unasyn  Vomiting and abdominal pain- enteritis seen in CT- could be due to covid- seen by Dr.Sakai. no surgical intervention Recommend getting xray abdomen tomorrow  COVID 19 illness on remdisivir and steroid Not vaccinated  Encephalopathy- due to the above- would check ammonia level?  Cirrhosis due to past h/o alcohol ___________________________________________________ Discussed with his nurse Note:  This document was prepared using Dragon voice recognition software and may include unintentional dictation errors.

## 2020-11-21 NOTE — Evaluation (Signed)
Clinical/Bedside Swallow Evaluation Patient Details  Name: Stanley Nguyen MRN: [id number] Date of Birth: [birth year]  Today's Date: 11/21/2020 Time: SLP Start Time (ACUTE ONLY): 1150 SLP Stop Time (ACUTE ONLY): 1250 SLP Time Calculation (min) (ACUTE ONLY): 60 min  Past Medical History:  Past Medical History:  Diagnosis Date  . Atrial fibrillation (HCC)   . CAD (coronary artery disease)   . COPD (chronic obstructive pulmonary disease) (HCC)   . Dysrhythmia    af. patient unaware of a diagnosis of a fib  . Gangrene (HCC) 09/27/2017   left foot/toes  . Hypertension   . Liver cirrhosis (HCC)   . Liver cirrhosis (HCC)   . Osteomyelitis (HCC) 2018   right ankle and foot  . Peripheral vascular disease (HCC)   . Pre-diabetes   . Seizure disorder (HCC) 2015   last seizure 2-3 years ago. takes vimpat   Past Surgical History:  Past Surgical History:  Procedure Laterality Date  . AMPUTATION TOE Left 09/27/2017   Procedure: AMPUTATION TOE-LEFT GREAT TOE, LEFT SECOND TOE;  Surgeon: Gwyneth Revels, DPM;  Location: [facility];  Service: Podiatry;  Laterality: Left;  . CARDIAC CATHETERIZATION     no stents  . ENDARTERECTOMY FEMORAL Right 07/17/2017   Procedure: ENDARTERECTOMY FEMORAL;  Surgeon: Annice Needy, MD;  Location: [facility];  Service: Vascular;  Laterality: Right;  . HERNIA REPAIR  1998   umbilical  . IRRIGATION AND DEBRIDEMENT FOOT Right 07/12/2017   Procedure: IRRIGATION AND DEBRIDEMENT FOOT;  Surgeon: Gwyneth Revels, DPM;  Location: [facility];  Service: Podiatry;  Laterality: Right;  . LOWER EXTREMITY ANGIOGRAM Right 07/17/2017   Procedure: LOWER EXTREMITY ANGIOGRAM ( FSA STENT PLACEMENT );  Surgeon: Annice Needy, MD;  Location: [facility];  Service: Vascular;  Laterality: Right;  . LOWER EXTREMITY ANGIOGRAPHY Right 07/15/2017   Procedure: Lower Extremity Angiography;  Surgeon: Annice Needy, MD;  Location: [facility];  Service: Cardiovascular;  Laterality: Right;  . LOWER EXTREMITY  ANGIOGRAPHY Left 09/11/2017   Procedure: Lower Extremity Angiography;  Surgeon: Annice Needy, MD;  Location: [facility];  Service: Cardiovascular;  Laterality: Left;  . LOWER EXTREMITY ANGIOGRAPHY Left 09/13/2017   Procedure: Lower Extremity Angiography;  Surgeon: Annice Needy, MD;  Location: [facility];  Service: Cardiovascular;  Laterality: Left;  . LOWER EXTREMITY INTERVENTION  09/11/2017   Procedure: LOWER EXTREMITY INTERVENTION;  Surgeon: Annice Needy, MD;  Location: [facility];  Service: Cardiovascular;;   HPI:  Pt is a 75 y.o. male with medical history significant for A. fib on Xarelto, seizure disorder, Alcoholic cirrhosis(stopped drinking 5 years ago), CAD, COPD, HTN, unvaccinated against COVID, who was in his usual state of health until 5 days ago when he stopped eating.  Most of the history is taken from the wife over the phone due to the patient's change in mental status.  She says usually he will eat his meals but had just been eating snacks.  He had a cough and shortness of breath.  His wife tried to offer him her nebulizer but he would not agree to that.  As the days progressed he became weaker and he appeared confused at times.  On the day of arrival, after getting his morning medication, which his wife administers, he started vomiting and complained of abdominal pain.  The home health nurse advised that he should be checked out in the emergency room.  Wife said when EMS arrived, he was found to  have a fever, and had soiled himself and appeared to have diarrhea.  EMS reported O2 sats of 89% on room air.  Pt is Covid+.  CXR: Patchy consolidations identified in bilateral lung bases worse  compared prior exam, consistent with pneumonia.  MD has suggested some degree of Dementia, Cognitive decline.   Assessment / Plan / Recommendation Clinical Impression  Pt appears to present w/ significant oropharyngeal phase dysphagia hampered by Significantly reduced  Cognitive status and little to no oro-lingual movements during oral swab stim, and oral care. W/ use of ice chips to wet lips and tongue, pt exhibited continued rote lingual movements but did not respond to the presentation of the ice chips boluses to show any awareness w/ appropriate oral prep and attention(licking of lips). MAX verbal/tactile cues given during oral care to focus on the tongue, lips. Slight pharyngeal phase response to potential saliva from oral care and melted ice chips (2) w/ swallow noted x1; laryngeal excursion appeared reduced. Suspect swallow incomplete. Oral phase presentation included poor lingual response to stim given. He exhibited a slight mouth open posture at rest before and after evaluation. OM exam incomplete; no unilateral weakness noted in lingual presentation. He responsed by pulling away to increased oral stim by gloved finger of SLP. Noted a severely Dried tongue surface and dried dark secretions in oral cavity during oral care provided by SLP. No po's attempted secondary to pt's declined Cognitive presentation and increased risk for aspiration/choking. Recommend strict NPO status w/ frequent oral care for hygiene and stimulation of swallowing next 2-3 days. MD consulted re: above and pt's NPO status; need for time to improve b/f reattempting po's. With pt's declined Cognitive presenation, MD was consulted for potential baseline status of Dementia(?). (pt was nonverbal and did not follow commands; agitated w/ increased movement in bed.). NSG updated. ST services will f/u w/ ongoing oral assessment/awareness stimulation for swallowing in hopes to establish and oral diet next few days. Dietician f/u recommended for support. SLP Visit Diagnosis: Dysphagia, oropharyngeal phase (R13.12) (declined Cognitive status)    Aspiration Risk  Severe aspiration risk;Risk for inadequate nutrition/hydration    Diet Recommendation  NPO w/ frequent oral care for hygiene and stimulation of  swallowing; aspiration precautions  Medication Administration: Via alternative means    Other  Recommendations Recommended Consults:  (Dietician f/u; Palliative Care consult for GOC) Oral Care Recommendations: Oral care QID;Staff/trained caregiver to provide oral care Other Recommendations:  (TBD)   Follow up Recommendations  (TBD)      Frequency and Duration min 3x week  2 weeks       Prognosis Prognosis for Safe Diet Advancement: Guarded Barriers to Reach Goals: Cognitive deficits;Language deficits;Severity of deficits;Time post onset      Swallow Study   General Date of Onset: 12/15/20 HPI: Pt is a 75 y.o. male with medical history significant for A. fib on Xarelto, seizure disorder, Alcoholic cirrhosis(stopped drinking 5 years ago), CAD, COPD, HTN, unvaccinated against COVID, who was in his usual state of health until 5 days ago when he stopped eating.  Most of the history is taken from the wife over the phone due to the patient's change in mental status.  She says usually he will eat his meals but had just been eating snacks.  He had a cough and shortness of breath.  His wife tried to offer him her nebulizer but he would not agree to that.  As the days progressed he became weaker and he appeared confused at times.  On the day of arrival, after getting his morning medication, which his wife administers, he started vomiting and complained of abdominal pain.  The home health nurse advised that he should be checked out in the emergency room.  Wife said when EMS arrived, he was found to have a fever, and had soiled himself and appeared to have diarrhea.  EMS reported O2 sats of 89% on room air.  Pt is Covid+.  CXR: Patchy consolidations identified in bilateral lung bases worse  compared prior exam, consistent with pneumonia.  MD has suggested some degree of Dementia, Cognitive decline. Type of Study: Bedside Swallow Evaluation Previous Swallow Assessment: none reported Diet Prior to this  Study: NPO (clear liquids earlier in admit) Temperature Spikes Noted: No (wbc 13.1) Respiratory Status: Nasal cannula (4-6L) History of Recent Intubation: No Behavior/Cognition: Confused;Agitated;Requires cueing;Doesn't follow directions (Nonverbal; eyes closed) Oral Cavity Assessment: Dry;Dried secretions Oral Care Completed by SLP: Yes Oral Cavity - Dentition:  (top dentition; no bottom dentition) Vision:  (n/a) Self-Feeding Abilities: Total assist Patient Positioning: Upright in bed (difficult to position initially d/t stiffness) Baseline Vocal Quality:  (nonvebal) Volitional Cough: Cognitively unable to elicit Volitional Swallow: Unable to elicit    Oral/Motor/Sensory Function Overall Oral Motor/Sensory Function:  (CNT d/t Cognitiion)   Ice Chips Ice chips: Not tested   Thin Liquid Thin Liquid: Not tested    Nectar Thick Nectar Thick Liquid: Not tested   Honey Thick Honey Thick Liquid: Not tested   Puree Puree: Not tested   Solid     Solid: Not tested        Jerilynn Som, MS, CCC-SLP Speech Language Pathologist Rehab Services [phone] [facility] 11/21/2020,6:03 PM

## 2020-11-22 ENCOUNTER — Encounter: Payer: Self-pay | Admitting: Internal Medicine

## 2020-11-22 DIAGNOSIS — A419 Sepsis, unspecified organism: Secondary | ICD-10-CM | POA: Diagnosis not present

## 2020-11-22 DIAGNOSIS — Z7189 Other specified counseling: Secondary | ICD-10-CM | POA: Diagnosis not present

## 2020-11-22 DIAGNOSIS — J9601 Acute respiratory failure with hypoxia: Secondary | ICD-10-CM | POA: Diagnosis not present

## 2020-11-22 DIAGNOSIS — Z515 Encounter for palliative care: Secondary | ICD-10-CM | POA: Diagnosis not present

## 2020-11-22 DIAGNOSIS — R6520 Severe sepsis without septic shock: Secondary | ICD-10-CM | POA: Diagnosis not present

## 2020-11-22 LAB — COMPREHENSIVE METABOLIC PANEL
ALT: 12 U/L (ref 0–44)
AST: 26 U/L (ref 15–41)
Albumin: 1.4 g/dL — ABNORMAL LOW (ref 3.5–5.0)
Alkaline Phosphatase: 75 U/L (ref 38–126)
Anion gap: 11 (ref 5–15)
BUN: 28 mg/dL — ABNORMAL HIGH (ref 8–23)
CO2: 22 mmol/L (ref 22–32)
Calcium: 6.4 mg/dL — CL (ref 8.9–10.3)
Chloride: 113 mmol/L — ABNORMAL HIGH (ref 98–111)
Creatinine, Ser: 1.21 mg/dL (ref 0.61–1.24)
GFR, Estimated: >60 mL/min (ref >60)
Glucose, Bld: 158 mg/dL — ABNORMAL HIGH (ref 70–99)
Potassium: 3.4 mmol/L — ABNORMAL LOW (ref 3.5–5.1)
Sodium: 146 mmol/L — ABNORMAL HIGH (ref 135–145)
Total Bilirubin: 1.3 mg/dL — ABNORMAL HIGH (ref 0.3–1.2)
Total Protein: 5.5 g/dL — ABNORMAL LOW (ref 6.5–8.1)

## 2020-11-22 LAB — C-REACTIVE PROTEIN: CRP: 8.9 mg/dL — ABNORMAL HIGH (ref <1.0)

## 2020-11-22 LAB — CBC WITH DIFFERENTIAL/PLATELET
Abs Immature Granulocytes: 0.1 10*3/uL — ABNORMAL HIGH (ref 0.00–0.07)
Basophils Absolute: 0 10*3/uL (ref 0.0–0.1)
Basophils Relative: 0 %
Eosinophils Absolute: 0 10*3/uL (ref 0.0–0.5)
Eosinophils Relative: 0 %
HCT: 26.3 % — ABNORMAL LOW (ref 39.0–52.0)
Hemoglobin: 9.1 g/dL — ABNORMAL LOW (ref 13.0–17.0)
Immature Granulocytes: 1 %
Lymphocytes Relative: 7 %
Lymphs Abs: 0.8 10*3/uL (ref 0.7–4.0)
MCH: 29.4 pg (ref 26.0–34.0)
MCHC: 34.6 g/dL (ref 30.0–36.0)
MCV: 85.1 fL (ref 80.0–100.0)
Monocytes Absolute: 0.7 10*3/uL (ref 0.1–1.0)
Monocytes Relative: 6 %
Neutro Abs: 10 10*3/uL — ABNORMAL HIGH (ref 1.7–7.7)
Neutrophils Relative %: 86 %
Platelets: 85 10*3/uL — ABNORMAL LOW (ref 150–400)
RBC: 3.09 MIL/uL — ABNORMAL LOW (ref 4.22–5.81)
RDW: 14.7 % (ref 11.5–15.5)
WBC: 11.5 10*3/uL — ABNORMAL HIGH (ref 4.0–10.5)
nRBC: 0.2 % (ref 0.0–0.2)

## 2020-11-22 LAB — URINE CULTURE: Culture: NO GROWTH

## 2020-11-22 LAB — FIBRIN DERIVATIVES D-DIMER (ARMC ONLY): Fibrin derivatives D-dimer (ARMC): 2697.97 ng/mL (FEU) — ABNORMAL HIGH (ref 0.00–499.00)

## 2020-11-22 LAB — AMMONIA: Ammonia: 22 umol/L (ref 9–35)

## 2020-11-22 LAB — PHOSPHORUS: Phosphorus: 2.8 mg/dL (ref 2.5–4.6)

## 2020-11-22 LAB — MAGNESIUM: Magnesium: 1.7 mg/dL (ref 1.7–2.4)

## 2020-11-22 MED ORDER — VANCOMYCIN HCL 1250 MG/250ML IV SOLN
1250.0000 mg | INTRAVENOUS | Status: DC
  Administered 2020-11-22: 1250 mg via INTRAVENOUS
  Filled 2020-11-22 (×2): qty 250

## 2020-11-22 MED ORDER — CALCIUM GLUCONATE-NACL 2-0.675 GM/100ML-% IV SOLN
2.0000 g | Freq: Once | INTRAVENOUS | Status: DC
  Filled 2020-11-22: qty 100

## 2020-11-22 MED ORDER — MAGNESIUM SULFATE 2 GM/50ML IV SOLN
2.0000 g | Freq: Once | INTRAVENOUS | Status: AC
  Administered 2020-11-22: 2 g via INTRAVENOUS
  Filled 2020-11-22: qty 50

## 2020-11-22 NOTE — Consult Note (Signed)
PHARMACY CONSULT NOTE  Pharmacy Consult for Electrolyte Monitoring and Replacement   Recent Labs: Potassium (mmol/L)  Date Value  11/22/2020 3.4 (L)  08/04/2013 4.4   Magnesium (mg/dL)  Date Value  16/07/9603 1.7  08/04/2013 1.2 (L)   Calcium (mg/dL)  Date Value  54/06/8118 6.4 (LL)   Calcium, Total (mg/dL)  Date Value  14/78/2956 7.6 (L)   Albumin (g/dL)  Date Value  21/30/8657 1.4 (L)  08/03/2013 1.4 (L)   Phosphorus (mg/dL)  Date Value  84/69/6295 2.8   Sodium (mmol/L)  Date Value  11/22/2020 146 (H)  08/04/2013 132 (L)   Corrected calcium: 8.5 mg/dL  Assessment: Patient is a 75 y/o M with medical history including Afib on Xarelto, seizure disorder on Vimpat, cirrhosis, CAD, COPD, HTN who was admitted with altered mental status, septic shock, COVID +, acute respiratory failure, partial SBO. Pharmacy consulted to assist with electrolyte monitoring and replacement as warranted. Patient is currently NPO.   MIVF: D5 1/2 NS + 20 mEq K/L at 100 mL/hr (48 mEq K/day)  Goal of Therapy:  Electrolytes within normal limits  Plan:  1/25 AM labs: Na 146, K 3.4, Phos 2.8, Mg 1.7 --Continue MIVF to provide 48 mEq K/day --Will order IV magnesium sulfate 2 g x 1 --Re-check electrolytes tomorrow AM  Tressie Ellis 11/22/2020 12:13 PM

## 2020-11-22 NOTE — Progress Notes (Signed)
Pharmacy Antibiotic Note  Stanley Nguyen is a 75 y.o. male with PMH A. fib on Xarelto, seizure disorder on Vimpat, alcoholic cirrhosis(stopped drinking 5 years ago), CAD, COPD, HTNadmitted on 12/12/2020 with sepsis of unclear origin. Pharmacy has been consulted for vancomycin dosing. Patient converted from metronidazole and cefepime to Unasyn 1/24. ID is following.   His Scr on admission was elevated above what appears to be his baseline level, improving daily.   Plan:  1) Continue Unasyn 3 g IV q6h  2) Increase vancomycin to 1250 mg IV every 24 hours  Goal AUC 400-550.  Expected AUC: 500.6  SCr used: 1.21   Css (calculated): 36.2 / 11.5 mcg/mL  Daily SCr while on vancomycin   Levels as clinically indicated  Height: 5\' 10"  (177.8 cm) Weight: 68 kg (150 lb) IBW/kg (Calculated) : 73  Temp (24hrs), Avg:96.4 F (35.8 C), Min:95.18 F (35.1 C), Max:97.34 F (36.3 C)  Recent Labs  Lab December 12, 2020 1857 2020/12/12 2230 11/19/20 0640 11/20/20 0414 11/21/20 0236 11/22/20 0941  WBC 21.6*  --  17.7* 16.3* 13.1* 11.5*  CREATININE 2.12*  --  1.70* 1.66* 1.50* 1.21  LATICACIDVEN 4.7* 2.6*  --   --   --   --     Estimated Creatinine Clearance: 52.3 mL/min (by C-G formula based on SCr of 1.21 mg/dL).    Antimicrobials this admission: Remdesivir 1/21 >> 1/25 Cefepime 1/21  >> 1/24 Vancomycin 1/21  >>  Metronidazole 1/22 >> 1/24 Unasyn 1/24 >>  Microbiology results: 1/23 UCx: NG 1/23 MRSA PCR: Positive 1/23 BCx: NGTD 1/21 BCx: Staphylococcus epidermidis (2/2 sets) & Staphylococcus capitis (1/2 sets). Vancomycin MIC 2. MecA detected by BCID. 1/21 UCx: Multiple species 1/22 GI panel: Negative 1/21 SARS CoV-2: Positive  Thank you for allowing pharmacy to be a part of this patient's care.  2/21 11/22/2020 12:05 PM

## 2020-11-22 NOTE — Progress Notes (Signed)
SLP Cancellation Note  Patient Details Name: Stanley Nguyen MRN: [id number] DOB: [birth year]   Cancelled treatment:       Reason Eval/Treat Not Completed: Patient not medically ready;Patient's level of consciousness;Medical issues which prohibited therapy (chart reviewed; consulted MD re: pt's status).   Pt continues to remain poorly responsive; unable to alert appropriately for any safe swallowing assessment. Pt has sustained RR in the mid30s - 40s.  Pt presents w/ declined medical and Cognitive status' indicating need for continued strict NPO status. Recommend frequent oral care for hygiene and stimulation of swallowing; aspiration precautions. ST services will f/u next 2-3 days for any improvement in status indicating appropriateness for po trials, intake. MD/NSG updated.      Jerilynn Som, MS, CCC-SLP Speech Language Pathologist Rehab Services [phone] [facility] 11/22/2020, 12:24 PM

## 2020-11-22 NOTE — Progress Notes (Signed)
PROGRESS NOTE    [name]  ZOX:[id number] DOB: 01/11/1946 DOA: 11/13/2020 PCP: Jodi Marble, MD    Brief Narrative:   HPI: Stanley Nguyen is a 75 y.o. male with medical history significant for A. fib on Xarelto, seizure disorder on Vimpat, alcoholic cirrhosis(stopped drinking 5 years ago), CAD, COPD, HTN, unvaccinated against COVID, who was in his usual state of health until 5 days ago when he stopped eating.  Most of the history is taken from the wife over the phone due to the patient's change in mental status. He had a cough and shortness of breath. CT scan of the abdomen done was worrisome for partial small bowel obstruction.  He was seen by the general surgery and advised NG tube.  However during the NG tube placement he vomited and may have aspirated and developed worsening tachypnea.He was moved to step down unit b/c of unstable respiratory status as he was full code and we were worried he may get intubated. PCCM was consulted incase he needed to be intubated. He developed coagulopathy, hematology was consulted incase he was in DIC> but heme didn't think he was in DIC. Family made him DNR today , plan is to monitor overnight if by morning he is not stable, talk to family about comfort measures as we have discussed this tonight and are in this agreement. ID was consulted for staph epi bacteremia.   Rounding week: 1/22-pt was confused and quiet for me this am. nsg later reported pt "ate stool possibly", vomited, could not place NGT. Now pt very restless and anxious with increase RR. cxr ordered pending.  1/23-patient confused this AM.  Seen in ER.  Moans when I call his name.  He has urinated on himself.  Per nursing they tried to change him however he was somewhat combative. 1/24- somnolent.  K 3.1. on HF 1/25-Family has decided to make pt DNR today. Palliative care on board.   Consultants:   surgery,.  Critical care, palliative care, ID  Procedures:  CT abdomen and pelvis  10/31/2020 Bibasilar pulmonary infiltrates and airway impaction most in keeping with changes of acute infection or aspiration. Given the findings below, aspiration is favored.  Circumferential bowel wall thickening and focal mesenteric edema involving the proximal jejunum just beyond the ligament of Treitz suggesting an underlying infectious or inflammatory enteritis. Focal ischemia would be unusual in this location, however, given the degree of vascular disease, segmental vascular thrombosis and/or distal embolization could also result in a similar appearance. Resultant partial small bowel obstruction with fluid-filled duodenum and stomach. Nasogastric intubation may be helpful for further management.  Peripheral vascular disease with extensive calcification and almost certain hemodynamically significant stenosis involving the proximal mesenteric arterial vasculature. The degree of stenosis would be better assessed with CT arteriography, if indicated.  Infiltrative change within the right inguinal region, possibly posttraumatic, infectious, or neoplastic. This could be further assessed with dedicated sonography.  Aortic Atherosclerosis (ICD10-I70.0).  Antimicrobials:   Cefepime>>>from admission ...d/c'd on 1/24  Vancomycin from admission to present  Metronidazole  From admit...d/c'd on 1/24  Unasyn start 1/24..>now   Subjective: Confused, somnolent.  On heated blanket/bear hugger  Objective: Vitals:   11/22/20 1300 11/22/20 1400 11/22/20 1500 11/22/20 1600  BP: (!) 139/92 (!) 154/94 (!) 147/87 (!) 150/91  Pulse: 86 74 70 72  Resp: (!) 38 (!) 36 (!) 36 (!) 37  Temp: (!) 97.34 F (36.3 C) (!) 97.52 F (36.4 C) (!) 97.52 F (36.4 C) (!) 97.52 F (  36.4 C)  TempSrc:      SpO2: 95% 95% 95% 95%  Weight:      Height:        Intake/Output Summary (Last 24 hours) at 11/22/2020 1758 Last data filed at 11/22/2020 1616 Gross per 24 hour  Intake 3032.75 ml  Output  1605 ml  Net 1427.75 ml   Filed Weights   10/31/2020 1850  Weight: 68 kg    Examination: Confused, solmnolent Rhonchi , no w/r Regular s1/s2 distant heart sounds  Soft decrease bs, nt No edema Unable to assess neuro exam    Data Reviewed: I have personally reviewed following labs and imaging studies  CBC: Recent Labs  Lab 11/05/2020 1857 11/19/20 0640 11/20/20 0414 11/21/20 0236 11/22/20 0941  WBC 21.6* 17.7* 16.3* 13.1* 11.5*  NEUTROABS 18.9* 15.1* 14.4* 11.2* 10.0*  HGB 11.0* 10.1* 10.3* 9.6* 9.1*  HCT 31.6* 29.7* 29.1* 26.3* 26.3*  MCV 84.3 85.1 83.4 81.7 85.1  PLT 119* 92* 129* 107* 85*   Basic Metabolic Panel: Recent Labs  Lab 11/15/2020 1857 11/19/20 0640 11/19/20 1805 11/20/20 0414 11/20/20 1551 11/21/20 0236 11/22/20 0941  NA 130* 135  --  141  --  146* 146*  K 3.1* 3.0* 3.5 3.1*  --  3.1* 3.4*  CL 89* 100  --  105  --  114* 113*  CO2 24 24  --  21*  --  21* 22  GLUCOSE 162* 121*  --  94  --  175* 158*  BUN 29* 31*  --  38*  --  37* 28*  CREATININE 2.12* 1.70*  --  1.66*  --  1.50* 1.21  CALCIUM 6.5* 6.1*  --  6.5*  --  6.8* 6.4*  MG  --   --   --  0.8* 2.1 1.7 1.7  PHOS  --   --   --  2.3*  --  1.7* 2.8   GFR: Estimated Creatinine Clearance: 52.3 mL/min (by C-G formula based on SCr of 1.21 mg/dL). Liver Function Tests: Recent Labs  Lab 11/05/2020 1857 11/19/20 0640 11/20/20 0414 11/21/20 0236 11/22/20 0941  AST 47* 29 45* 48* 26  ALT '12 9 11 12 12  ' ALKPHOS 52 45 47 49 75  BILITOT 1.5* 1.5* 2.3* 1.9* 1.3*  PROT 6.3* 5.8* 6.2* 5.6* 5.5*  ALBUMIN 1.7* 1.5* 1.7* 1.5* 1.4*   Recent Labs  Lab 11/21/2020 2230  LIPASE 19   Recent Labs  Lab 11/22/20 0941  AMMONIA 22   Coagulation Profile: Recent Labs  Lab 11/17/2020 1857 11/20/20 1841 11/21/20 0236  INR 2.4* 3.2* 2.8*   Cardiac Enzymes: No results for input(s): CKTOTAL, CKMB, CKMBINDEX, TROPONINI in the last 168 hours. BNP (last 3 results) No results for input(s): PROBNP in the last  8760 hours. HbA1C: No results for input(s): HGBA1C in the last 72 hours. CBG: Recent Labs  Lab 11/20/20 1436  GLUCAP 101*   Lipid Profile: No results for input(s): CHOL, HDL, LDLCALC, TRIG, CHOLHDL, LDLDIRECT in the last 72 hours. Thyroid Function Tests: No results for input(s): TSH, T4TOTAL, FREET4, T3FREE, THYROIDAB in the last 72 hours. Anemia Panel: No results for input(s): VITAMINB12, FOLATE, FERRITIN, TIBC, IRON, RETICCTPCT in the last 72 hours. Sepsis Labs: Recent Labs  Lab 11/04/2020 1857 11/01/2020 2230 11/19/20 0640  PROCALCITON  --   --  3.00  LATICACIDVEN 4.7* 2.6*  --     Recent Results (from the past 240 hour(s))  Blood Culture (routine x 2)     Status:  Abnormal (Preliminary result)   Collection Time: 11/26/2020  6:57 PM   Specimen: BLOOD  Result Value Ref Range Status   Specimen Description   Final    BLOOD LEFT ANTECUBITAL Performed at [facility], [address] [place], [place] [address]    Special Requests   Final    BOTTLES DRAWN AEROBIC AND ANAEROBIC Blood Culture results may not be optimal due to an inadequate volume of blood received in culture bottles Performed at [facility], [address] [place], [place] [address]    Culture  Setup Time   Final    GRAM POSITIVE COCCI IN BOTH AEROBIC AND ANAEROBIC BOTTLES CRITICAL RESULT CALLED TO, READ BACK BY AND VERIFIED WITH: LISA KLUTZ AT 1903 11/19/20.MF Performed at [facility], [place] [address] [place], [place] [address]    Culture (A)  Final    STAPHYLOCOCCUS EPIDERMIDIS STAPHYLOCOCCUS CAPITIS    Report Status PENDING  Incomplete   Organism ID, Bacteria STAPHYLOCOCCUS EPIDERMIDIS  Final      Susceptibility   Staphylococcus epidermidis - MIC*    CIPROFLOXACIN >=8 RESISTANT Resistant     ERYTHROMYCIN <=0.25 SENSITIVE Sensitive     GENTAMICIN <=0.5 SENSITIVE Sensitive     OXACILLIN >=4 RESISTANT Resistant     TETRACYCLINE >=16 RESISTANT Resistant     VANCOMYCIN 2  SENSITIVE Sensitive     TRIMETH/SULFA <=10 SENSITIVE Sensitive     CLINDAMYCIN <=0.25 SENSITIVE Sensitive     RIFAMPIN <=0.5 SENSITIVE Sensitive     Inducible Clindamycin NEGATIVE Sensitive     * STAPHYLOCOCCUS EPIDERMIDIS  Blood Culture ID Panel (Reflexed)     Status: Abnormal   Collection Time: 11/10/2020  6:57 PM  Result Value Ref Range Status   Enterococcus faecalis NOT DETECTED NOT DETECTED Final   Enterococcus Faecium NOT DETECTED NOT DETECTED Final   Listeria monocytogenes NOT DETECTED NOT DETECTED Final   Staphylococcus species DETECTED (A) NOT DETECTED Final    Comment: CRITICAL RESULT CALLED TO, READ BACK BY AND VERIFIED WITH: LISA Nonaka AT 1903 11/19/20. MF    Staphylococcus aureus (BCID) NOT DETECTED NOT DETECTED Final   Staphylococcus epidermidis DETECTED (A) NOT DETECTED Final    Comment: Methicillin (oxacillin) resistant coagulase negative staphylococcus. Possible blood culture contaminant (unless isolated from more than one blood culture draw or clinical case suggests pathogenicity). No antibiotic treatment is indicated for blood  culture contaminants. CRITICAL RESULT CALLED TO, READ BACK BY AND VERIFIED WITH:  LISA Buczkowski AT 1903 11/19/20. MF    Staphylococcus lugdunensis NOT DETECTED NOT DETECTED Final   Streptococcus species NOT DETECTED NOT DETECTED Final   Streptococcus agalactiae NOT DETECTED NOT DETECTED Final   Streptococcus pneumoniae NOT DETECTED NOT DETECTED Final   Streptococcus pyogenes NOT DETECTED NOT DETECTED Final   A.calcoaceticus-baumannii NOT DETECTED NOT DETECTED Final   Bacteroides fragilis NOT DETECTED NOT DETECTED Final   Enterobacterales NOT DETECTED NOT DETECTED Final   Enterobacter cloacae complex NOT DETECTED NOT DETECTED Final   Escherichia coli NOT DETECTED NOT DETECTED Final   Klebsiella aerogenes NOT DETECTED NOT DETECTED Final   Klebsiella oxytoca NOT DETECTED NOT DETECTED Final   Klebsiella pneumoniae NOT DETECTED NOT DETECTED Final    Proteus species NOT DETECTED NOT DETECTED Final   Salmonella species NOT DETECTED NOT DETECTED Final   Serratia marcescens NOT DETECTED NOT DETECTED Final   Haemophilus influenzae NOT DETECTED NOT DETECTED Final   Neisseria meningitidis NOT DETECTED NOT DETECTED Final   Pseudomonas aeruginosa NOT DETECTED NOT DETECTED  Final   Stenotrophomonas maltophilia NOT DETECTED NOT DETECTED Final   Candida albicans NOT DETECTED NOT DETECTED Final   Candida auris NOT DETECTED NOT DETECTED Final   Candida glabrata NOT DETECTED NOT DETECTED Final   Candida krusei NOT DETECTED NOT DETECTED Final   Candida parapsilosis NOT DETECTED NOT DETECTED Final   Candida tropicalis NOT DETECTED NOT DETECTED Final   Cryptococcus neoformans/gattii NOT DETECTED NOT DETECTED Final   Methicillin resistance mecA/C DETECTED (A) NOT DETECTED Final    Comment: CRITICAL RESULT CALLED TO, READ BACK BY AND VERIFIED WITH: LISA Stroder AT 1903 11/19/20. MF Performed at [facility], [address] [place], [place] [address]   Blood Culture (routine x 2)     Status: Abnormal (Preliminary result)   Collection Time: 11/01/2020  6:58 PM   Specimen: BLOOD  Result Value Ref Range Status   Specimen Description   Final    BLOOD RIGHT ANTECUBITAL Performed at [facility], [address] [place], [place] [address]    Special Requests   Final    BOTTLES DRAWN AEROBIC AND ANAEROBIC Blood Culture adequate volume Performed at [facility], [address] [place], [place] [address]    Culture  Setup Time   Final    GRAM POSITIVE COCCI IN BOTH AEROBIC AND ANAEROBIC BOTTLES CRITICAL VALUE NOTED.  VALUE IS CONSISTENT WITH PREVIOUSLY REPORTED AND CALLED VALUE. Performed at [facility], [address] [place], [place] [address]    Culture (A)  Final    STAPHYLOCOCCUS EPIDERMIDIS CULTURE REINCUBATED FOR BETTER GROWTH Performed at [facility], [place] [address], [place], [place] [address]     Report Status PENDING  Incomplete  Urine culture     Status: Abnormal   Collection Time: 11/26/2020  6:58 PM   Specimen: Urine, Random  Result Value Ref Range Status   Specimen Description   Final    URINE, RANDOM Performed at [facility], [address] [place], [place] [address]    Special Requests   Final    NONE Performed at [facility], [address] [place],  [address]    Culture MULTIPLE SPECIES PRESENT, SUGGEST RECOLLECTION (A)  Final   Report Status 11/20/2020 FINAL  Final  Gastrointestinal Panel by PCR , Stool     Status: None   Collection Time: 11/19/20  6:40 AM   Specimen: Stool  Result Value Ref Range Status   Campylobacter species NOT DETECTED NOT DETECTED Final   Plesimonas shigelloides NOT DETECTED NOT DETECTED Final   Salmonella species NOT DETECTED NOT DETECTED Final   Yersinia enterocolitica NOT DETECTED NOT DETECTED Final   Vibrio species NOT DETECTED NOT DETECTED Final   Vibrio cholerae NOT DETECTED NOT DETECTED Final   Enteroaggregative E coli (EAEC) NOT DETECTED NOT DETECTED Final   Enteropathogenic E coli (EPEC) NOT DETECTED NOT DETECTED Final   Enterotoxigenic E coli (ETEC) NOT DETECTED NOT DETECTED Final   Shiga like toxin producing E coli (STEC) NOT DETECTED NOT DETECTED Final   Shigella/Enteroinvasive E coli (EIEC) NOT DETECTED NOT DETECTED Final   Cryptosporidium NOT DETECTED NOT DETECTED Final   Cyclospora cayetanensis NOT DETECTED NOT DETECTED Final   Entamoeba histolytica NOT DETECTED NOT DETECTED Final   Giardia lamblia NOT DETECTED NOT DETECTED Final   Adenovirus F40/41 NOT DETECTED NOT DETECTED Final   Astrovirus NOT DETECTED NOT DETECTED Final   Norovirus GI/GII NOT DETECTED NOT DETECTED Final   Rotavirus A NOT DETECTED NOT DETECTED Final  Sapovirus (I, II, IV, and V) NOT DETECTED NOT DETECTED Final    Comment: Performed at [facility], [address] [place], [place] [address]  MRSA PCR Screening      Status: Abnormal   Collection Time: 11/20/20  3:00 PM   Specimen: Nasal Mucosa; Nasopharyngeal  Result Value Ref Range Status   MRSA by PCR POSITIVE (A) NEGATIVE Final    Comment:        The GeneXpert MRSA Assay (FDA approved for NASAL specimens only), is one component of a comprehensive MRSA colonization surveillance program. It is not intended to diagnose MRSA infection nor to guide or monitor treatment for MRSA infections. RESULT CALLED TO, READ BACK BY AND VERIFIED WITH: TY [place] '@[email or web address]'  ON 11/20/20 SKL Performed at [facility], [address] [place], [place] [address]   Urine Culture     Status: None   Collection Time: 11/20/20  5:30 PM   Specimen: Urine, Catheterized  Result Value Ref Range Status   Specimen Description   Final    URINE, CATHETERIZED Performed at [facility], [address] [place], [place] [address]    Special Requests NONE  Final   Culture   Final    NO GROWTH Performed at [facility], [place] [address] [place], [place] [address]    Report Status 11/22/2020 FINAL  Final  CULTURE, BLOOD (ROUTINE X 2) w Reflex to ID Panel     Status: None (Preliminary result)   Collection Time: 11/20/20  6:40 PM   Specimen: BLOOD  Result Value Ref Range Status   Specimen Description BLOOD BLOOD LEFT FOREARM  Final   Special Requests   Final    BOTTLES DRAWN AEROBIC AND ANAEROBIC Blood Culture adequate volume   Culture   Final    NO GROWTH 2 DAYS Performed at [facility], [address] [place], [place] [address]    Report Status PENDING  Incomplete  CULTURE, BLOOD (ROUTINE X 2) w Reflex to ID Panel     Status: None (Preliminary result)   Collection Time: 11/20/20  6:41 PM   Specimen: BLOOD  Result Value Ref Range Status   Specimen Description BLOOD BLOOD LEFT WRIST  Final   Special Requests   Final    BOTTLES DRAWN AEROBIC AND ANAEROBIC Blood Culture adequate volume   Culture   Final    NO GROWTH 2 DAYS Performed at  [facility], [address], [place], [place] [address]    Report Status PENDING  Incomplete         Radiology Studies: No results found.      Scheduled Meds: . acidophilus  2 capsule Oral Daily  . Chlorhexidine Gluconate Cloth  6 each Topical Daily  . enoxaparin (LOVENOX) injection  40 mg Subcutaneous Q24H  . methylPREDNISolone (SOLU-MEDROL) injection  40 mg Intravenous Q12H  . midodrine  2.5 mg Oral TID WC  . mupirocin ointment  1 application Nasal BID  . sodium phosphate  1 enema Rectal Once   Continuous Infusions: . ampicillin-sulbactam (UNASYN) IV 3 g (11/22/20 1151)  . calcium gluconate    . dextrose 5 % and 0.45 % NaCl with KCl 20 mEq/L 100 mL/hr at 11/22/20 1315  . lacosamide (VIMPAT) IV 50 mg (11/22/20 0935)  . vancomycin 1,250 mg (11/22/20 1616)    Assessment & Plan:   Principal Problem:   Severe sepsis ([place]) Active Problems:   Acute encephalopathy   Pneumonia   Chronic atrial fibrillation (  HCC)   Chronic pain   Peripheral vascular disease, unspecified ([place])   HTN (hypertension)   Cirrhosis ([place])   COPD with chronic bronchitis (HCC)   CAD (coronary artery disease)   Chronic anticoagulation   Chronic, continuous use of opioids   AKI (acute kidney injury) ([place])   Hyponatremia   Elevated troponin   Acute metabolic encephalopathy   COVID-19 virus infection   Acute respiratory failure with hypoxia (HCC)   Seizure disorder (HCC)   Acute gastroenteritis   Partial small bowel obstruction (HCC)   Pneumonia due to COVID-19 virus   Coagulopathy ([place])   75 year old male with a history of A. fib on Xarelto, seizure disorder on Vimpat, alcoholic cirrhosis(stopped drinking 5 years ago), CAD, COPD, HTN, unvaccinated against COVID, presenting decreased appetite x 5 days and I day of nausea and vomiting and abdominal pain and altered mental status.  Met severe sepsis criteria and tested positive for covid.    Severe sepsis/septic shock ([place]) -  Patient meeting severe sepsis criteria with fever, hypotension, leukocytosis with lactic acid 4.7 altered mental status, AKI and acute respiratory failure - Septic source uncertain: COVID+,  urinalysis sterile,.  Suspecting intra-abdominal source due to vomiting and diarrhea Procalcitonin elevated. See CT A/P results above Chest x-ray yesterday revealed patchy consolidation bilateral lungs worse than prior exam which is likely consistent to aspiration since he vomited Positive for Covid , not vaccinated, started on remdesivir and Solu-Medrol To multiple organisms recommend recollection Blood cultures with staph epi, MRSA detected ID consulted and input was appreciated, 4/4 staph epid. Bacteremia possibly be from skin tears eventhough dont appear to be infected Continue vancomycin Staph capitis in one of the cultures contaminant 1/25-Repeat bcx pending Echo pending Leukocytosis improving Continue midodrine Continue uasyn for asp. pna (replaced metronidazole and cefepime) Now Pt is DNR-family would like to monitor overnight if not improving they will make decision about comfort measures as inpt.  Will transfer out of step down unit since DNR .       Acute metabolic encephalopathy-  due to sepsis/covid/shock/aspiration pneumonia. MS at baseline was good per family, this was new change Still no improvement Will continue tx for sepsis/infection/covid neurocks Asp precautions          Acute respiratory failure with hypoxia (HCC)   Possible covid PNA v.s. Aspiration PNA. Repeat cxr from 1/22 with worsening b/l consolidation Possibly both, but suspect more aspiration PNA PCCM consulted since patient at risk of intubation 1/25-was initially started on cefepime and flagyl, switched to unasyn on 1/24. Continue iv Remdesivir and steroids for coivid Inflammatory markers improving       Aspiration PNA tachypnic on exam Npo due to intra abd. Issues and somonlence Continue  unaysn Asp precautions      Coagulopathy Elevated fibrin derivatives INR elevated at 2.8, ck am labs Concern for DIC...> hematology was consulted, input was appreciated.-likely multifactorial secondary to Xarelto, antibiotics, malnutrition, underlying cirrhosis/sepsis. No evidence of any acute liver failure as AST and ALT bili normal   Hypocalcemia- ca 6.4 Will give calcium gluconate iv  Ck am labs    AKI (acute kidney injury) (HCC)-prerenal - Secondary to vomiting and diarrhea as well as severe sepsis Improving with IV fluids, creatinine 1.21 Avoid nephrotoxic medication and renally dose other meds Continue to monitor levels      Acute gastroenteritis, possibly COVID-related - Patient presents with a several hour history of vomiting, diarrhea and abdominal pain - Possibly COVID gastroenteritis- inflammatory v.s. infectious. 1/22-see CT a/p report Support  care   Partial SBO on CT abdomen and pelvis - CT abdomen resulted after admission outlined above showing partial small bowel obstruction with fluid-filled duodenum and stomach 1/22-NGT to be placed, per nsg unable to place , pt vomited during placement and was fightening the tube. 1/24-multiple BM.  gsx following. Recommend continue NPO.  Per surgery can consider CT angio if worsening abdominal pain or persistent nausea and vomiting but again and/V/D with no obvious complaints of abdominal pain is not consistent with acute ischemic bowel     Hyponatremia - Serum sodium 130>>>135>>>141>>146 Improved with ivf hydration     Elevated troponin, likely demand ischemia   History of CAD 1/22-TP trending down.   echo -pending     Chronic atrial fibrillation (HCC)   Chronic anticoagulation - Hold rate control agent due to hypotension on admission - ?Alen Blew, unsure if pt was taking this.    Chronic pain on chronic opiates - Holding OxyContin for now due to altered mental status - As needed short acting pain  meds as needed  Hypotension with history of HTN (hypertension) - Hold home antihypertensives for now due to hypotension from severe sepsis and resume as appropriate Stared on midodrine, will continue  Alcoholic cirrhosis (HCC) - Appears stable.  No alcohol use in 5 years    COPD with chronic bronchitis ([place]) - Not acutely exacerbated    COVID-positive status - Possibly causing acute gastroenteritis as above, possibly incidental -see above about respiratory status. -crp and fibrin derv. Elevated. tx with Remdesivir and iv steroid     Seizure disorder ([place]) - Continue Vimpat - Ativan as needed.  IV substitution for Vimpat   Palliative care consulted.    DVT prophylaxis: lovenox Code Status:full Family Communication: updated wife and daughter  Status is: Inpatient  Remains inpatient appropriate because:IV treatments appropriate due to intensity of illness or inability to take PO   Dispo: The patient is from: Home              Anticipated d/c is to: Home              Anticipated d/c date is: > 3 days              Patient currently is not medically stable to d/c.Px guarded, at risk of intubation and possible death.   Difficult to place patient No            LOS: 4 days   Time spent: 45 minutes with more than 50% on [place], MD [facility] Pager [phone]  If 7PM-7AM, please contact night-coverage 11/22/2020, 5:58 PM

## 2020-11-22 NOTE — Consult Note (Signed)
Consultation Note Date: 11/22/2020   Patient Name: Stanley Nguyen  DOB: 1946-01-08  MRN: [id number]  Age / Sex: 75 y.o., male  PCP: Sherron Mondayejan-Sie, S Ahmed, MD Referring Physician: Lynn ItoAmery, Sahar, MD  Reason for Consultation: Establishing goals of care and Psychosocial/spiritual support  HPI/Patient Profile: 75 y.o. male  with past medical history of fib on Xarelto, seizure disorder on Vimpat, alcoholic cirrhosis(stopped drinking 5 years ago), CAD, COPD, HTN, unvaccinated against COVID, who was in his usual state of health until 5 days ago when he stopped eating. admitted on July 08, 2021 with severe sepsis/septic shock, acute metabolic encephalopathy.   Clinical Assessment and Goals of Care: I have reviewed medical records including EPIC notes, labs and imaging, received report from bedside nursing staff, examined the patient.  Stanley Nguyen is viewed throught the glass door of the ICU.  bedisde nursing staff share that he is non verbal at this time.   Call to daughter Stanley Nguyen to discuss diagnosis prognosis, GOC, EOL wishes, disposition and options.   I introduced Palliative Medicine as specialized medical care for people living with serious illness. It focuses on providing relief from the symptoms and stress of a serious illness. Mrs. Battershell is active with [facility].  Case discussed with [facility] in hospital representative.   As far as functional and nutritional status, Stanley Nguyen shares that overall Stanley Nguyen has been "doing good", but she now shares that he is much worse than his wife who is at home with hospice.     We discussed current illness and what it means in the larger context of on-going co-morbidities.  Natural disease trajectory and expectations at EOL were discussed.  We talk about the current treatment plan, and expected outcomes. Stanley Nguyen shares that she "does not want to see him suffer", but  is asking for 24 hours more before making choice for Hospice care.    We talk about COVID + status and the only residential hospice accepting covid patients is in [place].  Stanley Nguyen states that is where Stanley Nguyen is from.  I share that he may be too unstable for transfer, and he would have comfort care/hospice care, here at the hospital.   Advanced directives, concepts specific to code status, were considered and discussed.  At this time Stanley Nguyen endorses DNR status.  She states she needs time to think about comfort care, but endorses several times that she does not want Stanley Nguyen to suffer.  Questions and concerns were addressed.  The family was encouraged to call with questions or concerns.   Conference with CCM and bedside nursing related to patient condition, needs, goals of care, DNR status.  PMT to reach out to daughter, Stanley Nguyen, 1/26  HCPOA  NEXT OF KIN - wife, Stanley Nguyen, is currently active with at home hosp[facility].  Daughter, Stanley Nguyen, is the main contact.     SUMMARY OF RECOMMENDATIONS   At this point DNR/DNI no escalation of care 24 hours for outcomes PMT to follow-up with daughter  1/26   Code Status/Advance Care Planning:  DNR  Symptom Management:   Per hospitalist, no additional needs at this time.   Palliative Prophylaxis:   Oral Care and Turn Reposition  Additional Recommendations (Limitations, Scope, Preferences):  At this point treat the treatable but no CPR or intubation.  Psycho-social/Spiritual:   Desire for further Chaplaincy support:no  Additional Recommendations: Caregiving  Support/Resources and Education on Hospice  Prognosis:   Unable to determine, based on outcomes.   Discharge Planning: To be determined, based on outcomes.      Primary Diagnoses: Present on Admission: . Chronic atrial fibrillation (HCC) . Chronic pain . Peripheral vascular disease, unspecified (HCC) . Acute encephalopathy . Pneumonia due  to COVID-19 virus   I have reviewed the medical record, interviewed the patient and family, and examined the patient. The following aspects are pertinent.  Past Medical History:  Diagnosis Date  . Atrial fibrillation (HCC)   . CAD (coronary artery disease)   . COPD (chronic obstructive pulmonary disease) (HCC)   . Dysrhythmia    af. patient unaware of a diagnosis of a fib  . Gangrene (HCC) 09/27/2017   left foot/toes  . Hypertension   . Liver cirrhosis (HCC)   . Liver cirrhosis (HCC)   . Osteomyelitis (HCC) 2018   right ankle and foot  . Peripheral vascular disease (HCC)   . Pre-diabetes   . Seizure disorder (HCC) 2015   last seizure 2-3 years ago. takes vimpat   Social History   Socioeconomic History  . Marital status: Married    Spouse name: Not on file  . Number of children: Not on file  . Years of education: Not on file  . Highest education level: Not on file  Occupational History  . Not on file  Tobacco Use  . Smoking status: Former Smoker    Packs/day: 1.00    Types: Cigarettes    Quit date: 09/04/2017    Years since quitting: 3.2  . Smokeless tobacco: Never Used  . Tobacco comment: still smokes but less  Vaping Use  . Vaping Use: Never used  Substance and Sexual Activity  . Alcohol use: No    Comment: previously a drinker. stopped 6 years ago.  . Drug use: No  . Sexual activity: Not on file  Other Topics Concern  . Not on file  Social History Narrative  . Not on file   Social Determinants of Health   Financial Resource Strain: Not on file  Food Insecurity: Not on file  Transportation Needs: Not on file  Physical Activity: Not on file  Stress: Not on file  Social Connections: Not on file   History reviewed. No pertinent family history. Scheduled Meds: . acidophilus  2 capsule Oral Daily  . Chlorhexidine Gluconate Cloth  6 each Topical Daily  . enoxaparin (LOVENOX) injection  40 mg Subcutaneous Q24H  . methylPREDNISolone (SOLU-MEDROL) injection   40 mg Intravenous Q12H  . midodrine  2.5 mg Oral TID WC  . mupirocin ointment  1 application Nasal BID  . sodium phosphate  1 enema Rectal Once   Continuous Infusions: . ampicillin-sulbactam (UNASYN) IV 3 g (11/22/20 1151)  . dextrose 5 % and 0.45 % NaCl with KCl 20 mEq/L 100 mL/hr at 11/22/20 0600  . lacosamide (VIMPAT) IV 50 mg (11/22/20 0935)  . magnesium sulfate bolus IVPB    . vancomycin     PRN Meds:.acetaminophen **OR** acetaminophen, chlorpheniramine-HYDROcodone, guaiFENesin-dextromethorphan, LORazepam, ondansetron **OR** ondansetron (ZOFRAN) IV Medications Prior to Admission:  Prior to Admission medications   Medication Sig Start Date End Date Taking? Authorizing Provider  amLODipine (NORVASC) 5 MG tablet Take 5 mg by mouth daily.   Yes [provider]  gabapentin (NEURONTIN) 300 MG capsule Take 300 mg by mouth 3 (three) times daily.   Yes [provider]  LINZESS 72 MCG capsule Take 72 mcg by mouth every morning. 10/19/20  Yes [provider]  OXYCONTIN 30 MG 12 hr tablet Hold until you follow up with your outpatient provider, since you have had an unintentional overdose event. 12/19/19  Yes Darlin Priestly, MD  sotalol (BETAPACE) 80 MG tablet Take 80 mg by mouth daily.   Yes [provider]  SPIRIVA HANDIHALER 18 MCG inhalation capsule Place 18 mcg into inhaler and inhale daily.    Yes [provider]  VIMPAT 50 MG TABS tablet Take 50 mg by mouth 2 (two) times daily.   Yes [provider]  XARELTO 20 MG TABS tablet Take 20 mg by mouth every evening.    Yes [provider]  zolpidem (AMBIEN) 10 MG tablet Take 10 mg by mouth at bedtime as needed. 11/17/20  Yes [provider]  albuterol (VENTOLIN HFA) 108 (90 Base) MCG/ACT inhaler Inhale into the lungs. 12/15/20   [provider]  doxycycline (VIBRA-TABS) 100 MG tablet Take 100 mg by mouth 2 (two) times daily. 12-15-20   [provider]  nitroGLYCERIN  (NITROSTAT) 0.4 MG SL tablet Place 0.4 mg under the tongue every 5 (five) minutes as needed for chest pain.    [provider]  predniSONE (DELTASONE) 20 MG tablet Take 20 mg by mouth 2 (two) times daily. 15-Dec-2020   [provider]   Allergies  Allergen Reactions  . Keppra [Levetiracetam] Rash   Review of Systems  Unable to perform ROS: Acuity of condition    Physical Exam Vitals and nursing note reviewed.  Constitutional:      General: He is not in acute distress.    Appearance: He is ill-appearing.  Cardiovascular:     Rate and Rhythm: Tachycardia present.  Pulmonary:     Effort: Tachypnea present.  Musculoskeletal:     Comments: Frail and thin  Skin:    General: Skin is warm and dry.  Neurological:     Comments: Non verbal at this time      Vital Signs: BP (!) 153/93   Pulse (!) 108   Temp (!) 97.16 F (36.2 C)   Resp (!) 41   Ht 5\' 10"  (1.778 m)   Wt 68 kg   SpO2 96%   BMI 21.52 kg/m  Pain Scale: CPOT   Pain Score: 0-No pain   SpO2: SpO2: 96 % O2 Device:SpO2: 96 % O2 Flow Rate: .O2 Flow Rate (L/min): 4 L/min  IO: Intake/output summary:   Intake/Output Summary (Last 24 hours) at 11/22/2020 1303 Last data filed at 11/22/2020 0600 Gross per 24 hour  Intake 2607.75 ml  Output 1605 ml  Net 1002.75 ml    LBM: Last BM Date: 11/20/20 Baseline Weight: Weight: 68 kg Most recent weight: Weight: 68 kg     Palliative Assessment/Data:   Flowsheet Rows   Flowsheet Row Most Recent Value  Intake Tab   Referral Department Hospitalist  Unit at Time of Referral ICU  Palliative Care Primary Diagnosis Sepsis/Infectious Disease  Date Notified 11/20/20  Palliative Care Type New Palliative care  Reason for referral Clarify Goals of Care  Date of Admission 12/15/20  Date  first seen by Palliative Care 11/22/20  # of days Palliative referral response time 2 Day(s)  # of days IP prior to Palliative referral 2  Clinical Assessment   Palliative  Performance Scale Score 20%  Pain Max last 24 hours Not able to report  Pain Min Last 24 hours Not able to report  Dyspnea Max Last 24 Hours Not able to report  Dyspnea Min Last 24 hours Not able to report  Psychosocial & Spiritual Assessment   Palliative Care Outcomes       Time In: 1230 Time Out: 1340 Time Total: 70 minutes  Greater than 50%  of this time was spent counseling and coordinating care related to the above assessment and plan.  Signed by: Katheran Awe, NP   Please contact Palliative Medicine Team phone at [phone] for questions and concerns.  For individual provider: See Loretha Stapler

## 2020-11-23 DIAGNOSIS — J9601 Acute respiratory failure with hypoxia: Secondary | ICD-10-CM | POA: Diagnosis not present

## 2020-11-23 DIAGNOSIS — Z515 Encounter for palliative care: Secondary | ICD-10-CM | POA: Diagnosis not present

## 2020-11-23 DIAGNOSIS — G9340 Encephalopathy, unspecified: Secondary | ICD-10-CM | POA: Diagnosis not present

## 2020-11-23 DIAGNOSIS — A419 Sepsis, unspecified organism: Secondary | ICD-10-CM | POA: Diagnosis not present

## 2020-11-23 DIAGNOSIS — Z7189 Other specified counseling: Secondary | ICD-10-CM | POA: Diagnosis not present

## 2020-11-23 DIAGNOSIS — J1282 Pneumonia due to coronavirus disease 2019: Secondary | ICD-10-CM

## 2020-11-23 DIAGNOSIS — U071 COVID-19: Secondary | ICD-10-CM | POA: Diagnosis not present

## 2020-11-23 LAB — COMPREHENSIVE METABOLIC PANEL
ALT: 12 U/L (ref 0–44)
AST: 20 U/L (ref 15–41)
Albumin: 1.5 g/dL — ABNORMAL LOW (ref 3.5–5.0)
Alkaline Phosphatase: 91 U/L (ref 38–126)
Anion gap: 12 (ref 5–15)
BUN: 30 mg/dL — ABNORMAL HIGH (ref 8–23)
CO2: 21 mmol/L — ABNORMAL LOW (ref 22–32)
Calcium: 7 mg/dL — ABNORMAL LOW (ref 8.9–10.3)
Chloride: 119 mmol/L — ABNORMAL HIGH (ref 98–111)
Creatinine, Ser: 1.32 mg/dL — ABNORMAL HIGH (ref 0.61–1.24)
GFR, Estimated: 57 mL/min — ABNORMAL LOW (ref >60)
Glucose, Bld: 133 mg/dL — ABNORMAL HIGH (ref 70–99)
Potassium: 3.5 mmol/L (ref 3.5–5.1)
Sodium: 152 mmol/L — ABNORMAL HIGH (ref 135–145)
Total Bilirubin: 1.3 mg/dL — ABNORMAL HIGH (ref 0.3–1.2)
Total Protein: 5.8 g/dL — ABNORMAL LOW (ref 6.5–8.1)

## 2020-11-23 LAB — PROTIME-INR
INR: 2 — ABNORMAL HIGH (ref 0.8–1.2)
Prothrombin Time: 22.3 seconds — ABNORMAL HIGH (ref 11.4–15.2)

## 2020-11-23 LAB — CBC WITH DIFFERENTIAL/PLATELET
Abs Immature Granulocytes: 0.18 10*3/uL — ABNORMAL HIGH (ref 0.00–0.07)
Basophils Absolute: 0 10*3/uL (ref 0.0–0.1)
Basophils Relative: 0 %
Eosinophils Absolute: 0 10*3/uL (ref 0.0–0.5)
Eosinophils Relative: 0 %
HCT: 32.9 % — ABNORMAL LOW (ref 39.0–52.0)
Hemoglobin: 11.1 g/dL — ABNORMAL LOW (ref 13.0–17.0)
Immature Granulocytes: 1 %
Lymphocytes Relative: 6 %
Lymphs Abs: 0.9 10*3/uL (ref 0.7–4.0)
MCH: 29 pg (ref 26.0–34.0)
MCHC: 33.7 g/dL (ref 30.0–36.0)
MCV: 85.9 fL (ref 80.0–100.0)
Monocytes Absolute: 0.9 10*3/uL (ref 0.1–1.0)
Monocytes Relative: 6 %
Neutro Abs: 13.3 10*3/uL — ABNORMAL HIGH (ref 1.7–7.7)
Neutrophils Relative %: 87 %
Platelets: 108 10*3/uL — ABNORMAL LOW (ref 150–400)
RBC: 3.83 MIL/uL — ABNORMAL LOW (ref 4.22–5.81)
RDW: 15.2 % (ref 11.5–15.5)
WBC: 15.3 10*3/uL — ABNORMAL HIGH (ref 4.0–10.5)
nRBC: 0.2 % (ref 0.0–0.2)

## 2020-11-23 LAB — C-REACTIVE PROTEIN: CRP: 6.3 mg/dL — ABNORMAL HIGH (ref <1.0)

## 2020-11-23 LAB — FIBRIN DERIVATIVES D-DIMER (ARMC ONLY): Fibrin derivatives D-dimer (ARMC): 2703.3 ng/mL (FEU) — ABNORMAL HIGH (ref 0.00–499.00)

## 2020-11-23 LAB — MAGNESIUM: Magnesium: 1.9 mg/dL (ref 1.7–2.4)

## 2020-11-23 LAB — GLUCOSE, CAPILLARY: Glucose-Capillary: 106 mg/dL — ABNORMAL HIGH (ref 70–99)

## 2020-11-23 LAB — PHOSPHORUS: Phosphorus: 3.8 mg/dL (ref 2.5–4.6)

## 2020-11-23 MED ORDER — HALOPERIDOL LACTATE 5 MG/ML IJ SOLN: 0.5000 mg | INTRAMUSCULAR | Status: DC | PRN

## 2020-11-23 MED ORDER — POLYVINYL ALCOHOL 1.4 % OP SOLN
1.0000 [drp] | Freq: Four times a day (QID) | OPHTHALMIC | Status: DC | PRN
  Filled 2020-11-23: qty 15

## 2020-11-23 MED ORDER — SODIUM CHLORIDE 0.9 % IV BOLUS: 500.0000 mL | Freq: Once | INTRAVENOUS | Status: DC

## 2020-11-23 MED ORDER — HALOPERIDOL LACTATE 2 MG/ML PO CONC
0.5000 mg | ORAL | Status: DC | PRN
  Filled 2020-11-23: qty 0.3

## 2020-11-23 MED ORDER — GLYCOPYRROLATE 0.2 MG/ML IJ SOLN: 0.2000 mg | INTRAMUSCULAR | Status: DC | PRN

## 2020-11-23 MED ORDER — ALBUMIN HUMAN 25 % IV SOLN
25.0000 g | Freq: Once | INTRAVENOUS | Status: DC
  Filled 2020-11-23: qty 100

## 2020-11-23 MED ORDER — DILTIAZEM HCL 25 MG/5ML IV SOLN: 15.0000 mg | Freq: Once | INTRAVENOUS | Status: DC

## 2020-11-23 MED ORDER — DILTIAZEM HCL 25 MG/5ML IV SOLN
20.0000 mg | Freq: Once | INTRAVENOUS | Status: AC
  Administered 2020-11-23: 20 mg via INTRAVENOUS
  Filled 2020-11-23: qty 5

## 2020-11-23 MED ORDER — GLYCOPYRROLATE 1 MG PO TABS
1.0000 mg | ORAL_TABLET | ORAL | Status: DC | PRN
  Filled 2020-11-23: qty 1

## 2020-11-23 MED ORDER — LORAZEPAM 2 MG/ML IJ SOLN
1.0000 mg | Freq: Four times a day (QID) | INTRAMUSCULAR | Status: DC
  Administered 2020-11-23 – 2020-11-25 (×10): 1 mg via INTRAVENOUS
  Filled 2020-11-23 (×9): qty 1

## 2020-11-23 MED ORDER — HALOPERIDOL 0.5 MG PO TABS
0.5000 mg | ORAL_TABLET | ORAL | Status: DC | PRN
  Filled 2020-11-23: qty 1

## 2020-11-23 MED ORDER — MORPHINE SULFATE (PF) 2 MG/ML IV SOLN: 1.0000 mg | INTRAVENOUS | Status: DC | PRN

## 2020-11-23 MED ORDER — SODIUM CHLORIDE 0.9 % IV BOLUS: 1000.0000 mL | Freq: Once | INTRAVENOUS | Status: DC

## 2020-11-23 MED ORDER — BIOTENE DRY MOUTH MT LIQD: 15.0000 mL | OROMUCOSAL | Status: DC | PRN

## 2020-11-23 MED ORDER — MORPHINE SULFATE (PF) 2 MG/ML IV SOLN
1.0000 mg | INTRAVENOUS | Status: DC | PRN
  Administered 2020-11-23 – 2020-11-25 (×8): 2 mg via INTRAVENOUS
  Filled 2020-11-23 (×8): qty 1

## 2020-11-23 NOTE — Progress Notes (Signed)
Morphine given at this time per MD order for work of breathing

## 2020-11-23 NOTE — Progress Notes (Addendum)
PROGRESS NOTE    Stanley Nguyen   HRC:[id number]  DOB: 02-09-1946  PCP: Jodi Marble, MD    DOA: 11/04/2020 LOS: 5   Brief Narrative   HPI: Stanley Nguyen is a 75 y.o. male with medical history significant for A. fib on Xarelto, seizure disorder on Vimpat, alcoholic cirrhosis(stopped drinking 5 years ago), CAD, COPD, HTN, unvaccinated against COVID, who was in his usual state of health until 5 days ago when he stopped eating.  Most of the history is taken from the wife over the phone due to the patient's change in mental status. He had a cough and shortness of breath. CT scan of the abdomen done was worrisome for partial small bowel obstruction.  He was seen by the general surgery and advised NG tube.  However during the NG tube placement he vomited and may have aspirated and developed worsening tachypnea.He was moved to step down unit b/c of unstable respiratory status as he was full code and we were worried he may get intubated. PCCM was consulted incase he needed to be intubated. He developed coagulopathy, hematology was consulted incase he was in DIC> but heme didn't think he was in DIC. Family made him DNR today , plan is to monitor overnight if by morning he is not stable, talk to family about comfort measures as we have discussed this tonight and are in this agreement. ID was consulted for staph epi bacteremia.   I assumed care of patient 1/26.  Notified at start of shift patient hypotensive, tachycardic, tachypneic, rapid response had been called.  Family had consulted with palliative care previously.  Patient was made DNR status on 1/25.  After family updated on patient's declining status and poor prognosis, decision was made to transition to comfort care.  Family reportedly declined hospice home placement as these would be out of town due to Covid+ status.  Anticipate in hospital death.       Assessment & Plan   Principal Problem:   Severe sepsis ([place]) Active Problems:   Acute  encephalopathy   Pneumonia   Chronic atrial fibrillation (HCC)   Chronic pain   Peripheral vascular disease, unspecified (HCC)   HTN (hypertension)   Cirrhosis (HCC)   COPD with chronic bronchitis (HCC)   CAD (coronary artery disease)   Chronic anticoagulation   Chronic, continuous use of opioids   AKI (acute kidney injury) ([place])   Hyponatremia   Elevated troponin   Acute metabolic encephalopathy   COVID-19 virus infection   Acute respiratory failure with hypoxia (HCC)   Seizure disorder (HCC)   Acute gastroenteritis   Partial small bowel obstruction (HCC)   Pneumonia due to COVID-19 virus   Coagulopathy (HCC)   COMFORT CARE STATUS - as of morning of 11/23/20.   --comfort care per orders --oxygen only if needed for comfort, wean as able --notify provider if any signs of distress/discomfort despite current comfort meds   Prior A&P (before comfort care), per Dr. Kurtis Bushman with edits to update: "75 year old male with a history of A. fib on Xarelto, seizure disorder on Vimpat, alcoholic cirrhosis(stopped drinking 5 years ago), CAD, COPD, HTN, unvaccinated against COVID, presentingdecreased appetitex 5 days and I day ofnausea and vomiting and abdominal pain and altered mental status. Met severe sepsis criteria and tested positive for covid.  Severe sepsis/septic shock(HCC) Met severe sepsis criteria with fever, hypotension, leukocytosis with lactic acid 4.7 altered mental status, AKI and acute respiratory failure -Septic source uncertain: COVID+,  urinalysis sterile,. Suspecting  intra-abdominal source due to vomiting and diarrhea Procalcitonin elevated. See CT A/P results. Chest x-ray revealed patchy consolidation bilateral lungs worse than prior exam which is likely consistent to aspiration since he vomited Positive for Covid , not vaccinated, started on remdesivir and Solu-Medrol To multiple organisms recommend recollection Blood cultures with staph epi, MRSA detected ID  consulted and input was appreciated, 4/4 staph epid. Bacteremia possibly be from skin tears eventhough dont appear to be infected STOP vancomycin Staph capitis in one of the cultures contaminant 1/25-Repeat bcx pending Echo pending Leukocytosis improving Continue midodrine STOP Unasyn for asp. pna (replaced metronidazole and cefepime) Now Pt is DNR and comfort care status as of 1/26.  Acute metabolic encephalopathy- due to sepsis/covid/shock/aspiration pneumonia.  Persistent. MS at baseline was good per family, this was new change Still no improvement Will continue tx for sepsis/infection/covid neurocks Asp precautions  Acute respiratory failure with hypoxia (HCC) Possible covid PNA v.s. Aspiration PNA. Repeat cxr from 1/22 with worsening b/l consolidation Possibly both, but suspect more aspiration PNA PCCM consulted since patient at risk of intubation 1/25-initially on cefepime and flagyl >> Unasyn on 1/24>> dc 1/26 for comfort care. STOP iv Remdesivir and steroids for coivid  Aspiration PNA Continues to be tachypnic on exam.  Comfort status as above. Npo due to intra abd. Issues and somonlence Off antibiotics Asp precaution  Coagulopathy Elevated fibrin derivatives INR elevated at 2.8, ck am labs Concern for DIC...> hematology was consulted, input was appreciated.-likely multifactorial secondary to Xarelto, antibiotics, malnutrition, underlying cirrhosis/sepsis. No evidence of any acute liver failure as AST and ALT bili normal  Hypocalcemia- ca 6.4 Previously given IV calcium gluconate.  No further labs.   AKI (acute kidney injury) (HCC)-prerenal -Secondary to vomiting and diarrhea as well as severe sepsis Improving with IV fluids, creatinine 1.21 Avoid nephrotoxic medication and renally dose other meds  Acute gastroenteritis, possiblyCOVID-related -Patient presents with a several hour history of vomiting, diarrhea and abdominal pain -Possibly COVID  gastroenteritis- inflammatory v.s. infectious. 1/22-see CT a/p report Supportive care   Partial SBO on CT abdomen and pelvis -CT abdomen resulted after admission outlined above showing partial small bowel obstruction with fluid-filled duodenum and stomach 1/22-NGT to be placed, per nsg unable to place , pt vomited during placement and was fightening the tube. 1/24-multiple BM.  Gen surgery following. Recommend continue NPO.  Per surgery can consider CT angio if worsening abdominal pain or persistent nausea and vomiting but again and/V/D with no obvious complaints of abdominal pain is not consistent with acute ischemic bowel. Monitor, support care for comfort as needed.  Hyponatremia resolved / Hypernatremia -Serum sodium 130>>>135>>>141>>146 Hyponatremia resolved with fluids.  Now hypernatremic due to poor PO intake, water deficit.   Comfort care, no further lab monitoring.   Elevated troponin, likely demand ischemia History of CAD 1/22-TP trending down.   echo -pending   Chronic atrial fibrillation (HCC) Chronic anticoagulation -Hold rate control agent due to hypotension on admission -?Alen Blew, unsure if pt was taking this.  Chronic painon chronic opiates - now on morphine PRN comfort. -Holding OxyContin for now due to altered mental status, NPO for asipration -As needed short acting pain meds as needed  Hypotension with history ofHTN (hypertension) -Hold home antihypertensives for now due to hypotension from severe sepsis and resume as appropriate Stared on midodrine, will continue  Alcoholic cirrhosis ([place]) -Appears stable. No alcohol use in 5 years  COPD with chronic bronchitis ([place]) -Not acutely exacerbated   COVID-positive status -Possibly causing acute gastroenteritis as  above, possibly incidental -see above about respiratory status. -crp and fibrin derv. Elevated. tx with Remdesivir and iv steroid   Seizure disorder  ([place]) -Continue Vimpat     Patient BMI: Body mass index is 21.52 kg/m.   DVT prophylaxis:    Diet:  Diet Orders (From admission, onward)    Start     Ordered   11/23/20 0927  Diet NPO time specified Except for: Other (See Comments)  Diet effective now       Comments: Pleasure foods as requested/tolerated  Question:  Except for  Answer:  Other (See Comments)   11/23/20 0926            Code Status: DNR    Subjective 11/23/20    Pt somnolent and appears comfortable.  Family updated earlier and have since been in to see patient, met with palliative care and decided full comfort care.     Disposition Plan & Communication   Status is: Inpatient  Remains inpatient appropriate because:Inpatient level of care appropriate due to severity of illness and now comfort care status requiring IV medications and close monitoring for comfort.    Dispo: The patient is from: Home              Anticipated d/c is to: hospice home vs in hospital death              Anticipated d/c date is: 1 day              Patient currently is not medically stable to d/c.   Difficult to place patient Yes   Family Communication: daughter updated by phone first thing this AM.  She stated not to let patient suffer.  She will call his wife to update and then make him comfort care.   Consults, Procedures, Significant Events   Consultants:   Palliative care  Procedures:   none  Antimicrobials:  Anti-infectives (From admission, onward)   Start     Dose/Rate Route Frequency Ordered Stop   11/22/20 1700  vancomycin (VANCOREADY) IVPB 1250 mg/250 mL  Status:  Discontinued        1,250 mg 166.7 mL/hr over 90 Minutes Intravenous Every 24 hours 11/22/20 1205 11/23/20 0928   11/21/20 1800  Ampicillin-Sulbactam (UNASYN) 3 g in sodium chloride 0.9 % 100 mL IVPB  Status:  Discontinued        3 g 200 mL/hr over 30 Minutes Intravenous Every 6 hours 11/21/20 1550 11/23/20 0928   11/21/20 1700  vancomycin  (VANCOCIN) IVPB 1000 mg/200 mL premix  Status:  Discontinued        1,000 mg 200 mL/hr over 60 Minutes Intravenous Every 24 hours 11/21/20 1526 11/22/20 1205   11/19/20 1800  ceFEPIme (MAXIPIME) 2 g in sodium chloride 0.9 % 100 mL IVPB  Status:  Discontinued        2 g 200 mL/hr over 30 Minutes Intravenous Every 24 hours 11/19/20 0202 11/19/20 0821   11/19/20 1700  vancomycin (VANCOREADY) IVPB 750 mg/150 mL  Status:  Discontinued        750 mg 150 mL/hr over 60 Minutes Intravenous Every 24 hours 11/19/20 0204 11/21/20 1526   11/19/20 1000  remdesivir 100 mg in sodium chloride 0.9 % 100 mL IVPB       "Followed by" Linked Group Details   100 mg 200 mL/hr over 30 Minutes Intravenous Daily 11/04/2020 2118 11/22/20 0930   11/19/20 1000  ceFEPIme (MAXIPIME) 2 g in sodium chloride 0.9 % 100  mL IVPB  Status:  Discontinued        2 g 200 mL/hr over 30 Minutes Intravenous Every 12 hours 11/19/20 0821 11/21/20 1550   11/02/2020 2345  metroNIDAZOLE (FLAGYL) IVPB 500 mg  Status:  Discontinued        500 mg 100 mL/hr over 60 Minutes Intravenous Every 8 hours 11/17/2020 2118 11/21/20 1550   11/04/2020 2130  ceFEPIme (MAXIPIME) 2 g in sodium chloride 0.9 % 100 mL IVPB  Status:  Discontinued        2 g 200 mL/hr over 30 Minutes Intravenous  Once 11/12/2020 2118 11/04/2020 2152   11/05/2020 2130  vancomycin (VANCOCIN) IVPB 1000 mg/200 mL premix  Status:  Discontinued        1,000 mg 200 mL/hr over 60 Minutes Intravenous  Once 11/08/2020 2118 11/02/2020 2148   11/20/2020 2130  remdesivir 200 mg in sodium chloride 0.9% 250 mL IVPB       "Followed by" Linked Group Details   200 mg 580 mL/hr over 30 Minutes Intravenous Once 11/10/2020 2118 11/23/2020 2252   11/12/2020 2100  vancomycin (VANCOREADY) IVPB 1500 mg/300 mL        1,500 mg 150 mL/hr over 120 Minutes Intravenous  Once 11/15/2020 2020 11/19/20 0113   11/28/2020 2100  ceFEPIme (MAXIPIME) 2 g in sodium chloride 0.9 % 100 mL IVPB        2 g 200 mL/hr over 30 Minutes Intravenous   Once 11/27/2020 2020 11/01/2020 2106        Objective   Vitals:   11/23/20 0145 11/23/20 0455 11/23/20 0647 11/23/20 0700  BP: (!) 165/83 114/64 (!) 49/14 (!) 60/30  Pulse: (!) 105 (!) 105 (!) 45 74  Resp:  (!) 21 (!) 34 (!) 42  Temp: 97.7 F (36.5 C) 97.6 F (36.4 C)    TempSrc: Oral Axillary    SpO2: 94% 95%  99%  Weight:      Height:        Intake/Output Summary (Last 24 hours) at 11/23/2020 1453 Last data filed at 11/22/2020 2000 Gross per 24 hour  Intake 1649.67 ml  Output 600 ml  Net 1049.67 ml   Filed Weights   11/11/2020 1850  Weight: 68 kg    Physical Exam:  General exam: sleeping comfortably, no acute distress, cachectic HEENT: cyanotic lips and tongue, mouth open, dry mucus membranes Respiratory system: symmetric chest rise, increased respiratory effort with accessory muscle use. Cardiovascular system: RRR, no pedal edema.   Gastrointestinal system: soft, NT, ND   Labs   Data Reviewed: I have personally reviewed following labs and imaging studies  CBC: Recent Labs  Lab 11/19/20 0640 11/20/20 0414 11/21/20 0236 11/22/20 0941 11/23/20 0616  WBC 17.7* 16.3* 13.1* 11.5* 15.3*  NEUTROABS 15.1* 14.4* 11.2* 10.0* 13.3*  HGB 10.1* 10.3* 9.6* 9.1* 11.1*  HCT 29.7* 29.1* 26.3* 26.3* 32.9*  MCV 85.1 83.4 81.7 85.1 85.9  PLT 92* 129* 107* 85* 017*   Basic Metabolic Panel: Recent Labs  Lab 11/19/20 0640 11/19/20 1805 11/20/20 0414 11/20/20 1551 11/21/20 0236 11/22/20 0941 11/23/20 0616  NA 135  --  141  --  146* 146* 152*  K 3.0* 3.5 3.1*  --  3.1* 3.4* 3.5  CL 100  --  105  --  114* 113* 119*  CO2 24  --  21*  --  21* 22 21*  GLUCOSE 121*  --  94  --  175* 158* 133*  BUN 31*  --  38*  --  37* 28* 30*  CREATININE 1.70*  --  1.66*  --  1.50* 1.21 1.32*  CALCIUM 6.1*  --  6.5*  --  6.8* 6.4* 7.0*  MG  --   --  0.8* 2.1 1.7 1.7 1.9  PHOS  --   --  2.3*  --  1.7* 2.8 3.8   GFR: Estimated Creatinine Clearance: 47.9 mL/min (A) (by C-G formula based on  SCr of 1.32 mg/dL (H)). Liver Function Tests: Recent Labs  Lab 11/19/20 0640 11/20/20 0414 11/21/20 0236 11/22/20 0941 11/23/20 0616  AST 29 45* 48* 26 20  ALT '9 11 12 12 12  ' ALKPHOS 45 47 49 75 91  BILITOT 1.5* 2.3* 1.9* 1.3* 1.3*  PROT 5.8* 6.2* 5.6* 5.5* 5.8*  ALBUMIN 1.5* 1.7* 1.5* 1.4* 1.5*   Recent Labs  Lab 11/07/2020 2230  LIPASE 19   Recent Labs  Lab 11/22/20 0941  AMMONIA 22   Coagulation Profile: Recent Labs  Lab 11/12/2020 1857 11/20/20 1841 11/21/20 0236 11/23/20 0616  INR 2.4* 3.2* 2.8* 2.0*   Cardiac Enzymes: No results for input(s): CKTOTAL, CKMB, CKMBINDEX, TROPONINI in the last 168 hours. BNP (last 3 results) No results for input(s): PROBNP in the last 8760 hours. HbA1C: No results for input(s): HGBA1C in the last 72 hours. CBG: Recent Labs  Lab 11/20/20 1436 11/23/20 0711  GLUCAP 101* 106*   Lipid Profile: No results for input(s): CHOL, HDL, LDLCALC, TRIG, CHOLHDL, LDLDIRECT in the last 72 hours. Thyroid Function Tests: No results for input(s): TSH, T4TOTAL, FREET4, T3FREE, THYROIDAB in the last 72 hours. Anemia Panel: No results for input(s): VITAMINB12, FOLATE, FERRITIN, TIBC, IRON, RETICCTPCT in the last 72 hours. Sepsis Labs: Recent Labs  Lab 11/03/2020 1857 11/22/2020 2230 11/19/20 0640  PROCALCITON  --   --  3.00  LATICACIDVEN 4.7* 2.6*  --     Recent Results (from the past 240 hour(s))  Blood Culture (routine x 2)     Status: Abnormal (Preliminary result)   Collection Time: 11/10/2020  6:57 PM   Specimen: BLOOD  Result Value Ref Range Status   Specimen Description   Final    BLOOD LEFT ANTECUBITAL Performed at [facility], [address], [place], [place] [address]    Special Requests   Final    BOTTLES DRAWN AEROBIC AND ANAEROBIC Blood Culture results may not be optimal due to an inadequate volume of blood received in culture bottles Performed at [facility], [address], [place], [place] [address]     Culture  Setup Time   Final    GRAM POSITIVE COCCI IN BOTH AEROBIC AND ANAEROBIC BOTTLES CRITICAL RESULT CALLED TO, READ BACK BY AND VERIFIED WITH: LISA KLUTZ AT 1903 11/19/20.MF    Culture (A)  Final    STAPHYLOCOCCUS EPIDERMIDIS STAPHYLOCOCCUS CAPITIS SUSCEPTIBILITIES TO FOLLOW FOR STAPHYLOCOCCUS CAPITIS Performed at [facility], [place] [address] [place],  [address]    Report Status PENDING  Incomplete   Organism ID, Bacteria STAPHYLOCOCCUS EPIDERMIDIS  Final      Susceptibility   Staphylococcus epidermidis - MIC*    CIPROFLOXACIN >=8 RESISTANT Resistant     ERYTHROMYCIN <=0.25 SENSITIVE Sensitive     GENTAMICIN <=0.5 SENSITIVE Sensitive     OXACILLIN >=4 RESISTANT Resistant     TETRACYCLINE >=16 RESISTANT Resistant     VANCOMYCIN 2 SENSITIVE Sensitive     TRIMETH/SULFA <=10 SENSITIVE Sensitive     CLINDAMYCIN <=0.25 SENSITIVE Sensitive     RIFAMPIN <=0.5 SENSITIVE Sensitive  Inducible Clindamycin NEGATIVE Sensitive     * STAPHYLOCOCCUS EPIDERMIDIS  Blood Culture ID Panel (Reflexed)     Status: Abnormal   Collection Time: 11/04/2020  6:57 PM  Result Value Ref Range Status   Enterococcus faecalis NOT DETECTED NOT DETECTED Final   Enterococcus Faecium NOT DETECTED NOT DETECTED Final   Listeria monocytogenes NOT DETECTED NOT DETECTED Final   Staphylococcus species DETECTED (A) NOT DETECTED Final    Comment: CRITICAL RESULT CALLED TO, READ BACK BY AND VERIFIED WITH: LISA Beltran AT 1903 11/19/20. MF    Staphylococcus aureus (BCID) NOT DETECTED NOT DETECTED Final   Staphylococcus epidermidis DETECTED (A) NOT DETECTED Final    Comment: Methicillin (oxacillin) resistant coagulase negative staphylococcus. Possible blood culture contaminant (unless isolated from more than one blood culture draw or clinical case suggests pathogenicity). No antibiotic treatment is indicated for blood  culture contaminants. CRITICAL RESULT CALLED TO, READ BACK BY AND VERIFIED WITH:  LISA  Lazalde AT 1903 11/19/20. MF    Staphylococcus lugdunensis NOT DETECTED NOT DETECTED Final   Streptococcus species NOT DETECTED NOT DETECTED Final   Streptococcus agalactiae NOT DETECTED NOT DETECTED Final   Streptococcus pneumoniae NOT DETECTED NOT DETECTED Final   Streptococcus pyogenes NOT DETECTED NOT DETECTED Final   A.calcoaceticus-baumannii NOT DETECTED NOT DETECTED Final   Bacteroides fragilis NOT DETECTED NOT DETECTED Final   Enterobacterales NOT DETECTED NOT DETECTED Final   Enterobacter cloacae complex NOT DETECTED NOT DETECTED Final   Escherichia coli NOT DETECTED NOT DETECTED Final   Klebsiella aerogenes NOT DETECTED NOT DETECTED Final   Klebsiella oxytoca NOT DETECTED NOT DETECTED Final   Klebsiella pneumoniae NOT DETECTED NOT DETECTED Final   Proteus species NOT DETECTED NOT DETECTED Final   Salmonella species NOT DETECTED NOT DETECTED Final   Serratia marcescens NOT DETECTED NOT DETECTED Final   Haemophilus influenzae NOT DETECTED NOT DETECTED Final   Neisseria meningitidis NOT DETECTED NOT DETECTED Final   Pseudomonas aeruginosa NOT DETECTED NOT DETECTED Final   Stenotrophomonas maltophilia NOT DETECTED NOT DETECTED Final   Candida albicans NOT DETECTED NOT DETECTED Final   Candida auris NOT DETECTED NOT DETECTED Final   Candida glabrata NOT DETECTED NOT DETECTED Final   Candida krusei NOT DETECTED NOT DETECTED Final   Candida parapsilosis NOT DETECTED NOT DETECTED Final   Candida tropicalis NOT DETECTED NOT DETECTED Final   Cryptococcus neoformans/gattii NOT DETECTED NOT DETECTED Final   Methicillin resistance mecA/C DETECTED (A) NOT DETECTED Final    Comment: CRITICAL RESULT CALLED TO, READ BACK BY AND VERIFIED WITH: LISA Matheson AT 1903 11/19/20. MF Performed at [facility], [address] [place], [place] [address]   Blood Culture (routine x 2)     Status: Abnormal (Preliminary result)   Collection Time: 11/24/2020  6:58 PM   Specimen: BLOOD  Result Value Ref  Range Status   Specimen Description   Final    BLOOD RIGHT ANTECUBITAL Performed at [facility], [address], [place], [place] [address]    Special Requests   Final    BOTTLES DRAWN AEROBIC AND ANAEROBIC Blood Culture adequate volume Performed at [facility], [address] [place], [place] [address]    Culture  Setup Time   Final    GRAM POSITIVE COCCI IN BOTH AEROBIC AND ANAEROBIC BOTTLES CRITICAL VALUE NOTED.  VALUE IS CONSISTENT WITH PREVIOUSLY REPORTED AND CALLED VALUE. Performed at [facility], [address], [place], [place] [address]    Culture (A)  Final  STAPHYLOCOCCUS EPIDERMIDIS STAPHYLOCOCCUS CAPITIS    Report Status PENDING  Incomplete  Urine culture     Status: Abnormal   Collection Time: 11/27/2020  6:58 PM   Specimen: Urine, Random  Result Value Ref Range Status   Specimen Description   Final    URINE, RANDOM Performed at [facility], [address] [place], [place] [address]    Special Requests   Final    NONE Performed at [facility], [address] [place], [place] [address]    Culture MULTIPLE SPECIES PRESENT, SUGGEST RECOLLECTION (A)  Final   Report Status 11/20/2020 FINAL  Final  Gastrointestinal Panel by PCR , Stool     Status: None   Collection Time: 11/19/20  6:40 AM   Specimen: Stool  Result Value Ref Range Status   Campylobacter species NOT DETECTED NOT DETECTED Final   Plesimonas shigelloides NOT DETECTED NOT DETECTED Final   Salmonella species NOT DETECTED NOT DETECTED Final   Yersinia enterocolitica NOT DETECTED NOT DETECTED Final   Vibrio species NOT DETECTED NOT DETECTED Final   Vibrio cholerae NOT DETECTED NOT DETECTED Final   Enteroaggregative E coli (EAEC) NOT DETECTED NOT DETECTED Final   Enteropathogenic E coli (EPEC) NOT DETECTED NOT DETECTED Final   Enterotoxigenic E coli (ETEC) NOT DETECTED NOT DETECTED Final   Shiga like toxin producing E coli (STEC) NOT DETECTED NOT  DETECTED Final   Shigella/Enteroinvasive E coli (EIEC) NOT DETECTED NOT DETECTED Final   Cryptosporidium NOT DETECTED NOT DETECTED Final   Cyclospora cayetanensis NOT DETECTED NOT DETECTED Final   Entamoeba histolytica NOT DETECTED NOT DETECTED Final   Giardia lamblia NOT DETECTED NOT DETECTED Final   Adenovirus F40/41 NOT DETECTED NOT DETECTED Final   Astrovirus NOT DETECTED NOT DETECTED Final   Norovirus GI/GII NOT DETECTED NOT DETECTED Final   Rotavirus A NOT DETECTED NOT DETECTED Final   Sapovirus (I, II, IV, and V) NOT DETECTED NOT DETECTED Final    Comment: Performed at [facility], [address] [place], [place] [address]  MRSA PCR Screening     Status: Abnormal   Collection Time: 11/20/20  3:00 PM   Specimen: Nasal Mucosa; Nasopharyngeal  Result Value Ref Range Status   MRSA by PCR POSITIVE (A) NEGATIVE Final    Comment:        The GeneXpert MRSA Assay (FDA approved for NASAL specimens only), is one component of a comprehensive MRSA colonization surveillance program. It is not intended to diagnose MRSA infection nor to guide or monitor treatment for MRSA infections. RESULT CALLED TO, READ BACK BY AND VERIFIED WITH: TY Martinique '@[email or web address]'  ON 11/20/20 SKL Performed at [facility], [address] [place], [place] [address]   Urine Culture     Status: None   Collection Time: 11/20/20  5:30 PM   Specimen: Urine, Catheterized  Result Value Ref Range Status   Specimen Description   Final    URINE, CATHETERIZED Performed at [facility], [address] [place], [place] [address]    Special Requests NONE  Final   Culture   Final    NO GROWTH Performed at [facility], [place] [address] [place], [place] [address]    Report Status 11/22/2020 FINAL  Final  CULTURE, BLOOD (ROUTINE X 2) w Reflex to ID Panel     Status: None (Preliminary result)   Collection Time: 11/20/20  6:40 PM   Specimen: BLOOD  Result Value Ref Range Status   Specimen  Description BLOOD BLOOD LEFT FOREARM  Final   Special Requests   Final    BOTTLES DRAWN AEROBIC AND ANAEROBIC Blood Culture adequate volume   Culture   Final    NO GROWTH 3 DAYS Performed at [facility], [address] [place], [place] [address]    Report Status PENDING  Incomplete  CULTURE, BLOOD (ROUTINE X 2) w Reflex to ID Panel     Status: None (Preliminary result)   Collection Time: 11/20/20  6:41 PM   Specimen: BLOOD  Result Value Ref Range Status   Specimen Description BLOOD BLOOD LEFT WRIST  Final   Special Requests   Final    BOTTLES DRAWN AEROBIC AND ANAEROBIC Blood Culture adequate volume   Culture   Final    NO GROWTH 3 DAYS Performed at [facility], [address], [place], [place] [address]    Report Status PENDING  Incomplete      Imaging Studies   No results found.   Medications   Scheduled Meds: . LORazepam  1 mg Intravenous Q6H   Continuous Infusions:      LOS: 5 days    Time spent: 30 minutes with > 50% spent at bedside and in coordination of care.    Ezekiel Slocumb, DO [facility]  11/23/2020, 2:53 PM    If 7PM-7AM, please contact night-coverage. How to contact the [facility] Attending or Consulting provider [place] or covering provider during after hours [place], for this patient?    1. Check the care team in [place] and look for a) attending/consulting TRH provider listed and b) the [facility] team listed 2. Log into [email or web address] and use 's universal password to access. If you do not have the password, please contact the hospital operator. 3. Locate the [facility] provider you are looking for under [facility] and page to a number that you can be directly reached. 4. If you still have difficulty reaching the provider, please page the [facility] (Director on Call) for the Hospitalists listed on amion for assistance.

## 2020-11-23 NOTE — Care Management Important Message (Signed)
Important Message  Patient Details  Name: Stanley Nguyen MRN: [id number] Date of Birth: Nov 23, 1945   Medicare Important Message Given:  Other (see comment)  Patient is on Comfort Care as 11/23/20.  Out of respect for the patient and family no Important Message from [facility] given.  Olegario Messier A Dashanna Kinnamon 11/23/2020, 2:18 PM

## 2020-11-23 NOTE — Significant Event (Signed)
Rapid Response Event Note   Reason for Call :  Patient BP 60/30  Initial Focused Assessment: Patient Alert and Disoriented X4 Patients lungs diminished bilaterally   Patients O2 96 on 5L [place] HR 100s-110s BP 129/96  Blood sugar 107   Interventions:  Primary nurse informed Rapid nurse and house supervisor mental status and increased work of breathing was patients baseline. We were preparing the fluid Bolus when Dr.Duncan arrived. The follow BP readings that were taken 5 minutes apart were 140/77 and 155/69. Plan of Care:  Monitor Patients BP and HR Closely   Event Summary:   MD Notified:[id number]  Call Time:0650 Arrival JIRC:7893 End YBOF:7510  Judyann Munson, RN

## 2020-11-23 NOTE — Progress Notes (Signed)
Palliative: Mr. Wirt is lying quietly in bed.  He appears acutely/chronically ill and very frail.  He will briefly make but not keep eye contact.  He does not respond verbally to voice or touch, and I do not believe that he is able to make his basic needs known.  Present today at bedside his wife, Diamond Nickel and daughter, Helmut Muster.  We talked about Mr. Dambach acute and chronic health concerns.  We talked about his worsening status.  Helmut Muster tearfully states that they would like to focus on comfort and dignity, let nature take its course.  We talked about comfort care what is and is not provided.  We talked about symptom management, medicines for pain, anxiety, breathlessness.  Wife Diamond Nickel is currently under hospice care at home.  We talked about the benefits of transitioning to residential hospice for specialist care at the end of life.  Helmut Muster is agreeable to residential hospice if Mr. Plata is stable.  Conference with attending, bedside nursing staff, transition of care team related to patient condition, needs, goals of care.  Plan: Comfort and dignity at end-of-life, let nature take its course.  Agreeable to residential hospice if facility will accept him to Covid unit. Prognosis: Days expected, based on poor functional status, frailty, poor by mouth intake, family's desire to focus on comfort and dignity, let nature take its course.  35 minutes Lillia Carmel, NP Palliative medicine team Team phone [phone] Greater than 50% of this time was spent counseling and coordinating care related to the above assessment and plan.

## 2020-11-23 NOTE — TOC Progression Note (Signed)
Transition of Care [facility]) - Progression Note    Patient Details  Name: Stanley Nguyen MRN: [id number] Date of Birth: 06-28-1946  Transition of Care [facility]) CM/SW Contact  Liliana Cline, LCSW Phone Number: 11/23/2020, 2:30 PM  Clinical Narrative:   Spoke to Dr. Denton Lank and NP Rodney Booze, patient may be appropriate for hospice home, would need COVID bed. CSW called and spoke with daughter Helmut Muster regarding [facility] taking COVID patients. She reported the family is not agreeable to hospice home unless it is in [place], would want patient to remain at the hospital on comfort care measures. She asked about hospice taking over patient's care while patient remains in the hospital, said this was presented as an option to her, CSW confirmed with [facility] Representative Boyd Kerbs that this is not an option. Asked RN to call Helmut Muster with an update when able per her request.         Expected Discharge Plan and Services                                                 Social Determinants of Health (SDOH) Interventions    Readmission Risk Interventions No flowsheet data found.

## 2020-11-23 NOTE — Hospital Course (Addendum)
HPI: Stanley Nguyen is a 75 y.o. male with medical history significant for A. fib on Xarelto, seizure disorder on Vimpat, alcoholic cirrhosis(stopped drinking 5 years ago), CAD, COPD, HTN, unvaccinated against COVID, who was in his usual state of health until 5 days ago when he stopped eating.  Most of the history is taken from the wife over the phone due to the patient's change in mental status. He had a cough and shortness of breath. CT scan of the abdomen done was worrisome for partial small bowel obstruction.  He was seen by the general surgery and advised NG tube.  However during the NG tube placement he vomited and may have aspirated and developed worsening tachypnea.He was moved to step down unit b/c of unstable respiratory status as he was full code and we were worried he may get intubated. PCCM was consulted incase he needed to be intubated. He developed coagulopathy, hematology was consulted incase he was in DIC> but heme didn't think he was in DIC. Family made him DNR today , plan is to monitor overnight if by morning he is not stable, talk to family about comfort measures as we have discussed this tonight and are in this agreement. ID was consulted for staph epi bacteremia.   I assumed care of patient 1/26.  Notified at start of shift patient hypotensive, tachycardic, tachypneic, rapid response had been called.  Family had consulted with palliative care previously.  Patient was made DNR status on 1/25.  After family updated on patient's declining status and poor prognosis, decision was made to transition to comfort care.  Family reportedly declined hospice home placement as these would be out of town due to Covid+ status.  Anticipate in hospital death.

## 2020-11-23 NOTE — Progress Notes (Signed)
Notified at start of shift, patient with hypotension BP 60/30, RR in 40's and patient appearing in pain. Spoke with patient's daughter, Merrie Roof, by phone this AM, provided update on patient's declining course overnight and this morning.  She requests that we keep him comfortable and help his respiratory distress with morphine, but support his BP as needed until she speak with patient's wife.  Very likely to transition to full comfort measures.  Daughter to call unit with their decision after updating his wife.

## 2020-11-23 NOTE — Progress Notes (Signed)
Daughter and wife at bedside.  Visibly upset.

## 2020-11-23 NOTE — Progress Notes (Signed)
Night coverage  Rapid response was called at 6:57 AM on this patient for hypotension of 60/30. Earlier in the night patient was in rapid A. fib and received diltiazem 20 mg IV x1. On my arrival, I met rapid response team with blood pressure had already rebounded to 115/65. According to night shift nurse at the bedside, there has been no change in patient's mentation since he was transferred from the ICU at 10 PM last night. IV fluid bolus originally ordered to treat hypotension was discontinued. Patient is DNR  Hypotension, spontaneously resolved -Decision made to continue to monitor patient closely. -Consider transfer to the PCU if any further signs of instability -Given rapid resolution in blood pressure without intervention, suspect low blood pressure of 60/30 was possibly a misread however given patient's original admission diagnosis of septic shock, continue to monitor closely.

## 2020-11-23 NOTE — Progress Notes (Signed)
  Chaplain On-Call was paged at [phone] by Unit Secretary Ginger who reported patient is actively dying and the family at bedside requests support.  Chaplain was providing lengthy ministry for a patient in the Emergency Department at that time. Chaplain was able to arrive on the Unit after donning necessary PPE at 0925. Chaplain learned from Nurses Rodney Booze and Natalia Leatherwood that the family has just left for home.  Chaplain provided prayer for the patient at the bedside, and later called patient's daughter Merrie Roof to let her and the family know about that ministry of presence and prayer. Helmut Muster stated gratitude from herself and her mother.  Chaplain assured them of the availability of Chaplains as needed.  Chaplain Mayukha Symmonds B. Salia Cangemi M.Div., [facility]

## 2020-11-24 DIAGNOSIS — A419 Sepsis, unspecified organism: Secondary | ICD-10-CM | POA: Diagnosis not present

## 2020-11-24 DIAGNOSIS — Z515 Encounter for palliative care: Secondary | ICD-10-CM

## 2020-11-24 DIAGNOSIS — U071 COVID-19: Secondary | ICD-10-CM | POA: Diagnosis not present

## 2020-11-24 DIAGNOSIS — J1282 Pneumonia due to coronavirus disease 2019: Secondary | ICD-10-CM | POA: Diagnosis not present

## 2020-11-24 DIAGNOSIS — J9601 Acute respiratory failure with hypoxia: Secondary | ICD-10-CM | POA: Diagnosis not present

## 2020-11-24 DIAGNOSIS — R6520 Severe sepsis without septic shock: Secondary | ICD-10-CM | POA: Diagnosis not present

## 2020-11-24 MED ORDER — SODIUM CHLORIDE 0.9 % IV SOLN
50.0000 mg | Freq: Two times a day (BID) | INTRAVENOUS | Status: DC
  Administered 2020-11-24: 17:00:00 50 mg via INTRAVENOUS
  Filled 2020-11-24 (×3): qty 5

## 2020-11-24 NOTE — Progress Notes (Signed)
Attempted to call wife for nightly update, no answer, called daughter Helmut Muster and gave her the nightly update

## 2020-11-24 NOTE — Progress Notes (Addendum)
PROGRESS NOTE    Stanley Nguyen   OLM:[id number]  DOB: 22-Jan-1946  PCP: Jodi Marble, MD    DOA: 11/19/2020 LOS: 6   Brief Narrative   HPI: Stanley Nguyen is a 75 y.o. male with medical history significant for A. fib on Xarelto, seizure disorder on Vimpat, alcoholic cirrhosis(stopped drinking 5 years ago), CAD, COPD, HTN, unvaccinated against COVID, who was in his usual state of health until 5 days ago when he stopped eating.  Most of the history is taken from the wife over the phone due to the patient's change in mental status. He had a cough and shortness of breath. CT scan of the abdomen done was worrisome for partial small bowel obstruction.  He was seen by the general surgery and advised NG tube.  However during the NG tube placement he vomited and may have aspirated and developed worsening tachypnea.He was moved to step down unit b/c of unstable respiratory status as he was full code and we were worried he may get intubated. PCCM was consulted incase he needed to be intubated. He developed coagulopathy, hematology was consulted incase he was in DIC> but heme didn't think he was in DIC. Family made him DNR today , plan is to monitor overnight if by morning he is not stable, talk to family about comfort measures as we have discussed this tonight and are in this agreement. ID was consulted for staph epi bacteremia.   I assumed care of patient 1/26.  Notified at start of shift patient hypotensive, tachycardic, tachypneic, rapid response had been called.  Family had consulted with palliative care previously.  Patient was made DNR status on 1/25.  After family updated on patient's declining status and poor prognosis, decision was made to transition to comfort care.  Family reportedly declined hospice home placement as these would be out of town due to Covid+ status.  Anticipate in hospital death.       Assessment & Plan   Principal Problem:   Comfort measures only status Active Problems:    Acute encephalopathy   Chronic atrial fibrillation (HCC)   Chronic pain   Peripheral vascular disease, unspecified (HCC)   HTN (hypertension)   Cirrhosis (HCC)   COPD with chronic bronchitis (HCC)   CAD (coronary artery disease)   Chronic anticoagulation   Chronic, continuous use of opioids   AKI (acute kidney injury) ([place])   Hyponatremia   Elevated troponin   Severe sepsis (HCC)   Acute metabolic encephalopathy   COVID-19 virus infection   Acute respiratory failure with hypoxia (HCC)   Seizure disorder (HCC)   Acute gastroenteritis   Partial small bowel obstruction (HCC)   Pneumonia due to COVID-19 virus   Coagulopathy (HCC)   COMFORT CARE STATUS - as of morning of 11/23/20.   --comfort care per orders --oxygen only if needed for comfort, wean as able --notify provider if any signs of distress/discomfort despite current comfort meds   Prior A&P (before comfort care), per Dr. Kurtis Bushman with edits to update: "75 year old male with a history of A. fib on Xarelto, seizure disorder on Vimpat, alcoholic cirrhosis(stopped drinking 5 years ago), CAD, COPD, HTN, unvaccinated against COVID, presentingdecreased appetitex 5 days and I day ofnausea and vomiting and abdominal pain and altered mental status. Met severe sepsis criteria and tested positive for covid.  Severe sepsis/septic shock(HCC) Met severe sepsis criteria with fever, hypotension, leukocytosis with lactic acid 4.7 altered mental status, AKI and acute respiratory failure -Septic source uncertain: COVID+,  urinalysis sterile,. Suspecting intra-abdominal source due to vomiting and diarrhea Procalcitonin elevated. See CT A/P results. Chest x-ray revealed patchy consolidation bilateral lungs worse than prior exam which is likely consistent to aspiration since he vomited Positive for Covid , not vaccinated, started on remdesivir and Solu-Medrol To multiple organisms recommend recollection Blood cultures with staph epi, MRSA  detected ID consulted and input was appreciated, 4/4 staph epid. Bacteremia possibly be from skin tears eventhough dont appear to be infected STOP vancomycin Staph capitis in one of the cultures contaminant 1/25-Repeat bcx pending Echo pending Leukocytosis improving Continue midodrine STOP Unasyn for asp. pna (replaced metronidazole and cefepime) Now Pt is DNR and comfort care status as of 1/26.  Acute metabolic encephalopathy- due to sepsis/covid/shock/aspiration pneumonia.  Persistent. MS at baseline was good per family, this was new change Still no improvement Will continue tx for sepsis/infection/covid neurocks Asp precautions  Acute respiratory failure with hypoxia (HCC) Possible covid PNA v.s. Aspiration PNA. Repeat cxr from 1/22 with worsening b/l consolidation Possibly both, but suspect more aspiration PNA PCCM consulted since patient at risk of intubation 1/25-initially on cefepime and flagyl >> Unasyn on 1/24>> dc 1/26 for comfort care. STOP iv Remdesivir and steroids for coivid  Aspiration PNA Continues to be tachypnic on exam.  Comfort status as above. Npo due to intra abd. Issues and somonlence Off antibiotics Asp precaution  Coagulopathy Elevated fibrin derivatives INR elevated at 2.8, ck am labs Concern for DIC...> hematology was consulted, input was appreciated.-likely multifactorial secondary to Xarelto, antibiotics, malnutrition, underlying cirrhosis/sepsis. No evidence of any acute liver failure as AST and ALT bili normal  Hypocalcemia- ca 6.4 Previously given IV calcium gluconate.  No further labs.   AKI (acute kidney injury) (HCC)-prerenal -Secondary to vomiting and diarrhea as well as severe sepsis Improving with IV fluids, creatinine 1.21 Avoid nephrotoxic medication and renally dose other meds  Acute gastroenteritis, possiblyCOVID-related -Patient presents with a several hour history of vomiting, diarrhea and abdominal pain -Possibly  COVID gastroenteritis- inflammatory v.s. infectious. 1/22-see CT a/p report Supportive care   Partial SBO on CT abdomen and pelvis -CT abdomen resulted after admission outlined above showing partial small bowel obstruction with fluid-filled duodenum and stomach 1/22-NGT to be placed, per nsg unable to place , pt vomited during placement and was fightening the tube. 1/24-multiple BM.  Gen surgery following. Recommend continue NPO.  Per surgery can consider CT angio if worsening abdominal pain or persistent nausea and vomiting but again and/V/D with no obvious complaints of abdominal pain is not consistent with acute ischemic bowel. Monitor, support care for comfort as needed.  Hyponatremia resolved / Hypernatremia -Serum sodium 130>>>135>>>141>>146 Hyponatremia resolved with fluids.  Now hypernatremic due to poor PO intake, water deficit.   Comfort care, no further lab monitoring.   Elevated troponin, likely demand ischemia History of CAD 1/22-TP trending down.   echo -pending   Chronic atrial fibrillation (HCC) Chronic anticoagulation -Hold rate control agent due to hypotension on admission -?Alen Blew, unsure if pt was taking this.  Chronic painon chronic opiates - now on morphine PRN comfort. -Holding OxyContin for now due to altered mental status, NPO for asipration -As needed short acting pain meds as needed  Hypotension with history ofHTN (hypertension) -Hold home antihypertensives for now due to hypotension from severe sepsis and resume as appropriate Stared on midodrine, will continue  Alcoholic cirrhosis ([place]) -Appears stable. No alcohol use in 5 years  COPD with chronic bronchitis ([place]) -Not acutely exacerbated   COVID-positive status -Possibly causing  acute gastroenteritis as above, possibly incidental -see above about respiratory status. -crp and fibrin derv. Elevated. tx with Remdesivir and iv steroid   Seizure disorder  ([place]) -Continue Vimpat     Patient BMI: Body mass index is 21.52 kg/m.   DVT prophylaxis:    Diet:  Diet Orders (From admission, onward)    Start     Ordered   11/23/20 0927  Diet NPO time specified Except for: Other (See Comments)  Diet effective now       Comments: Pleasure foods as requested/tolerated  Question:  Except for  Answer:  Other (See Comments)   11/23/20 0926            Code Status: DNR    Subjective 11/24/20    Patient sedated and appears comfortable.  He is having some accessory muscle use for breathing but does not appear in distress.  No acute events reported.  No family at bedside at this time.   Disposition Plan & Communication   Status is: Inpatient  Remains inpatient appropriate because:Inpatient level of care appropriate due to severity of illness and now comfort care status requiring IV medications and close monitoring for comfort.  Anticipated hospital death.  Dispo: The patient is from: Home              Anticipated d/c is to: Funeral home              Anticipated d/c date is: 1 day              Patient currently is not medically stable to d/c.   Difficult to place patient Yes   Family Communication: daughter updated by phone morning of 1/26.  Patient been made comfort care shortly thereafter.     Consults, Procedures, Significant Events   Consultants:   Palliative care  Procedures:   none  Antimicrobials:  Anti-infectives (From admission, onward)   Start     Dose/Rate Route Frequency Ordered Stop   11/22/20 1700  vancomycin (VANCOREADY) IVPB 1250 mg/250 mL  Status:  Discontinued        1,250 mg 166.7 mL/hr over 90 Minutes Intravenous Every 24 hours 11/22/20 1205 11/23/20 0928   11/21/20 1800  Ampicillin-Sulbactam (UNASYN) 3 g in sodium chloride 0.9 % 100 mL IVPB  Status:  Discontinued        3 g 200 mL/hr over 30 Minutes Intravenous Every 6 hours 11/21/20 1550 11/23/20 0928   11/21/20 1700  vancomycin (VANCOCIN) IVPB 1000  mg/200 mL premix  Status:  Discontinued        1,000 mg 200 mL/hr over 60 Minutes Intravenous Every 24 hours 11/21/20 1526 11/22/20 1205   11/19/20 1800  ceFEPIme (MAXIPIME) 2 g in sodium chloride 0.9 % 100 mL IVPB  Status:  Discontinued        2 g 200 mL/hr over 30 Minutes Intravenous Every 24 hours 11/19/20 0202 11/19/20 0821   11/19/20 1700  vancomycin (VANCOREADY) IVPB 750 mg/150 mL  Status:  Discontinued        750 mg 150 mL/hr over 60 Minutes Intravenous Every 24 hours 11/19/20 0204 11/21/20 1526   11/19/20 1000  remdesivir 100 mg in sodium chloride 0.9 % 100 mL IVPB       "Followed by" Linked Group Details   100 mg 200 mL/hr over 30 Minutes Intravenous Daily 11/09/2020 2118 11/22/20 0930   11/19/20 1000  ceFEPIme (MAXIPIME) 2 g in sodium chloride 0.9 % 100 mL IVPB  Status:  Discontinued  2 g 200 mL/hr over 30 Minutes Intravenous Every 12 hours 11/19/20 0821 11/21/20 1550   11/22/2020 2345  metroNIDAZOLE (FLAGYL) IVPB 500 mg  Status:  Discontinued        500 mg 100 mL/hr over 60 Minutes Intravenous Every 8 hours 11/19/2020 2118 11/21/20 1550   11/24/2020 2130  ceFEPIme (MAXIPIME) 2 g in sodium chloride 0.9 % 100 mL IVPB  Status:  Discontinued        2 g 200 mL/hr over 30 Minutes Intravenous  Once 11/24/2020 2118 11/09/2020 2152   10/29/2020 2130  vancomycin (VANCOCIN) IVPB 1000 mg/200 mL premix  Status:  Discontinued        1,000 mg 200 mL/hr over 60 Minutes Intravenous  Once 11/02/2020 2118 11/16/2020 2148   11/05/2020 2130  remdesivir 200 mg in sodium chloride 0.9% 250 mL IVPB       "Followed by" Linked Group Details   200 mg 580 mL/hr over 30 Minutes Intravenous Once 10/29/2020 2118 11/17/2020 2252   10/29/2020 2100  vancomycin (VANCOREADY) IVPB 1500 mg/300 mL        1,500 mg 150 mL/hr over 120 Minutes Intravenous  Once 11/17/2020 2020 11/19/20 0113   11/20/2020 2100  ceFEPIme (MAXIPIME) 2 g in sodium chloride 0.9 % 100 mL IVPB        2 g 200 mL/hr over 30 Minutes Intravenous  Once 11/07/2020 2020  11/03/2020 2106        Objective   Vitals:   11/23/20 0700 11/23/20 2050 11/24/20 0802 11/24/20 1153  BP: (!) 60/30  (!) 146/59 (!) 168/66  Pulse: 74  63 63  Resp: (!) 42   20  Temp:   97.7 F (36.5 C) 97.8 F (36.6 C)  TempSrc:      SpO2: 99% 97% 96% 95%  Weight:      Height:        Intake/Output Summary (Last 24 hours) at 11/24/2020 1554 Last data filed at 11/24/2020 0418 Gross per 24 hour  Intake --  Output 200 ml  Net -200 ml   Filed Weights   11/26/2020 1850  Weight: 68 kg    Physical Exam:  General exam: sleeping comfortably, no acute distress, cachectic HEENT: cyanotic lips and tongue, mouth open, dry mucus membranes Respiratory system: symmetric chest rise, increased respiratory effort with accessory muscle use. Cardiovascular system: RRR, no peripheral edema Gastrointestinal system: soft, NT, ND   Labs   Data Reviewed: I have personally reviewed following labs and imaging studies  CBC: Recent Labs  Lab 11/19/20 0640 11/20/20 0414 11/21/20 0236 11/22/20 0941 11/23/20 0616  WBC 17.7* 16.3* 13.1* 11.5* 15.3*  NEUTROABS 15.1* 14.4* 11.2* 10.0* 13.3*  HGB 10.1* 10.3* 9.6* 9.1* 11.1*  HCT 29.7* 29.1* 26.3* 26.3* 32.9*  MCV 85.1 83.4 81.7 85.1 85.9  PLT 92* 129* 107* 85* 161*   Basic Metabolic Panel: Recent Labs  Lab 11/19/20 0640 11/19/20 1805 11/20/20 0414 11/20/20 1551 11/21/20 0236 11/22/20 0941 11/23/20 0616  NA 135  --  141  --  146* 146* 152*  K 3.0* 3.5 3.1*  --  3.1* 3.4* 3.5  CL 100  --  105  --  114* 113* 119*  CO2 24  --  21*  --  21* 22 21*  GLUCOSE 121*  --  94  --  175* 158* 133*  BUN 31*  --  38*  --  37* 28* 30*  CREATININE 1.70*  --  1.66*  --  1.50* 1.21 1.32*  CALCIUM  6.1*  --  6.5*  --  6.8* 6.4* 7.0*  MG  --   --  0.8* 2.1 1.7 1.7 1.9  PHOS  --   --  2.3*  --  1.7* 2.8 3.8   GFR: Estimated Creatinine Clearance: 47.9 mL/min (A) (by C-G formula based on SCr of 1.32 mg/dL (H)). Liver Function Tests: Recent Labs  Lab  11/19/20 0640 11/20/20 0414 11/21/20 0236 11/22/20 0941 11/23/20 0616  AST 29 45* 48* 26 20  ALT '9 11 12 12 12  ' ALKPHOS 45 47 49 75 91  BILITOT 1.5* 2.3* 1.9* 1.3* 1.3*  PROT 5.8* 6.2* 5.6* 5.5* 5.8*  ALBUMIN 1.5* 1.7* 1.5* 1.4* 1.5*   Recent Labs  Lab 11/26/2020 2230  LIPASE 19   Recent Labs  Lab 11/22/20 0941  AMMONIA 22   Coagulation Profile: Recent Labs  Lab 11/27/2020 1857 11/20/20 1841 11/21/20 0236 11/23/20 0616  INR 2.4* 3.2* 2.8* 2.0*   Cardiac Enzymes: No results for input(s): CKTOTAL, CKMB, CKMBINDEX, TROPONINI in the last 168 hours. BNP (last 3 results) No results for input(s): PROBNP in the last 8760 hours. HbA1C: No results for input(s): HGBA1C in the last 72 hours. CBG: Recent Labs  Lab 11/20/20 1436 11/23/20 0711  GLUCAP 101* 106*   Lipid Profile: No results for input(s): CHOL, HDL, LDLCALC, TRIG, CHOLHDL, LDLDIRECT in the last 72 hours. Thyroid Function Tests: No results for input(s): TSH, T4TOTAL, FREET4, T3FREE, THYROIDAB in the last 72 hours. Anemia Panel: No results for input(s): VITAMINB12, FOLATE, FERRITIN, TIBC, IRON, RETICCTPCT in the last 72 hours. Sepsis Labs: Recent Labs  Lab 11/23/2020 1857 10/31/2020 2230 11/19/20 0640  PROCALCITON  --   --  3.00  LATICACIDVEN 4.7* 2.6*  --     Recent Results (from the past 240 hour(s))  Blood Culture (routine x 2)     Status: Abnormal (Preliminary result)   Collection Time: 11/17/2020  6:57 PM   Specimen: BLOOD  Result Value Ref Range Status   Specimen Description   Final    BLOOD LEFT ANTECUBITAL Performed at [facility], [address], [place], [place] [address]    Special Requests   Final    BOTTLES DRAWN AEROBIC AND ANAEROBIC Blood Culture results may not be optimal due to an inadequate volume of blood received in culture bottles Performed at [facility], [address] [place], [place] [address]    Culture  Setup Time   Final    GRAM POSITIVE COCCI IN BOTH  AEROBIC AND ANAEROBIC BOTTLES CRITICAL RESULT CALLED TO, READ BACK BY AND VERIFIED WITH: LISA KLUTZ AT 1903 11/19/20.MF    Culture (A)  Final    STAPHYLOCOCCUS EPIDERMIDIS STAPHYLOCOCCUS CAPITIS SUSCEPTIBILITIES TO FOLLOW FOR STAPHYLOCOCCUS CAPITIS Performed at [facility], [place] [address] [place],  [address]    Report Status PENDING  Incomplete   Organism ID, Bacteria STAPHYLOCOCCUS EPIDERMIDIS  Final      Susceptibility   Staphylococcus epidermidis - MIC*    CIPROFLOXACIN >=8 RESISTANT Resistant     ERYTHROMYCIN <=0.25 SENSITIVE Sensitive     GENTAMICIN <=0.5 SENSITIVE Sensitive     OXACILLIN >=4 RESISTANT Resistant     TETRACYCLINE >=16 RESISTANT Resistant     VANCOMYCIN 2 SENSITIVE Sensitive     TRIMETH/SULFA <=10 SENSITIVE Sensitive     CLINDAMYCIN <=0.25 SENSITIVE Sensitive     RIFAMPIN <=0.5 SENSITIVE Sensitive     Inducible Clindamycin NEGATIVE Sensitive     * STAPHYLOCOCCUS EPIDERMIDIS  Blood Culture  ID Panel (Reflexed)     Status: Abnormal   Collection Time: 11/09/2020  6:57 PM  Result Value Ref Range Status   Enterococcus faecalis NOT DETECTED NOT DETECTED Final   Enterococcus Faecium NOT DETECTED NOT DETECTED Final   Listeria monocytogenes NOT DETECTED NOT DETECTED Final   Staphylococcus species DETECTED (A) NOT DETECTED Final    Comment: CRITICAL RESULT CALLED TO, READ BACK BY AND VERIFIED WITH: LISA Achor AT 1903 11/19/20. MF    Staphylococcus aureus (BCID) NOT DETECTED NOT DETECTED Final   Staphylococcus epidermidis DETECTED (A) NOT DETECTED Final    Comment: Methicillin (oxacillin) resistant coagulase negative staphylococcus. Possible blood culture contaminant (unless isolated from more than one blood culture draw or clinical case suggests pathogenicity). No antibiotic treatment is indicated for blood  culture contaminants. CRITICAL RESULT CALLED TO, READ BACK BY AND VERIFIED WITH:  LISA Gargus AT 1903 11/19/20. MF    Staphylococcus lugdunensis NOT  DETECTED NOT DETECTED Final   Streptococcus species NOT DETECTED NOT DETECTED Final   Streptococcus agalactiae NOT DETECTED NOT DETECTED Final   Streptococcus pneumoniae NOT DETECTED NOT DETECTED Final   Streptococcus pyogenes NOT DETECTED NOT DETECTED Final   A.calcoaceticus-baumannii NOT DETECTED NOT DETECTED Final   Bacteroides fragilis NOT DETECTED NOT DETECTED Final   Enterobacterales NOT DETECTED NOT DETECTED Final   Enterobacter cloacae complex NOT DETECTED NOT DETECTED Final   Escherichia coli NOT DETECTED NOT DETECTED Final   Klebsiella aerogenes NOT DETECTED NOT DETECTED Final   Klebsiella oxytoca NOT DETECTED NOT DETECTED Final   Klebsiella pneumoniae NOT DETECTED NOT DETECTED Final   Proteus species NOT DETECTED NOT DETECTED Final   Salmonella species NOT DETECTED NOT DETECTED Final   Serratia marcescens NOT DETECTED NOT DETECTED Final   Haemophilus influenzae NOT DETECTED NOT DETECTED Final   Neisseria meningitidis NOT DETECTED NOT DETECTED Final   Pseudomonas aeruginosa NOT DETECTED NOT DETECTED Final   Stenotrophomonas maltophilia NOT DETECTED NOT DETECTED Final   Candida albicans NOT DETECTED NOT DETECTED Final   Candida auris NOT DETECTED NOT DETECTED Final   Candida glabrata NOT DETECTED NOT DETECTED Final   Candida krusei NOT DETECTED NOT DETECTED Final   Candida parapsilosis NOT DETECTED NOT DETECTED Final   Candida tropicalis NOT DETECTED NOT DETECTED Final   Cryptococcus neoformans/gattii NOT DETECTED NOT DETECTED Final   Methicillin resistance mecA/C DETECTED (A) NOT DETECTED Final    Comment: CRITICAL RESULT CALLED TO, READ BACK BY AND VERIFIED WITH: LISA Paglia AT 1903 11/19/20. MF Performed at [facility], [address] [place], [place] [address]   Blood Culture (routine x 2)     Status: Abnormal (Preliminary result)   Collection Time: 11/03/2020  6:58 PM   Specimen: BLOOD  Result Value Ref Range Status   Specimen Description   Final    BLOOD  RIGHT ANTECUBITAL Performed at [facility], [address] [place], [place] [address]    Special Requests   Final    BOTTLES DRAWN AEROBIC AND ANAEROBIC Blood Culture adequate volume Performed at [facility], [address], [place], [place] [address]    Culture  Setup Time   Final    GRAM POSITIVE COCCI IN BOTH AEROBIC AND ANAEROBIC BOTTLES CRITICAL VALUE NOTED.  VALUE IS CONSISTENT WITH PREVIOUSLY REPORTED AND CALLED VALUE. Performed at [facility], [address], [place], [place] [address]    Culture (A)  Final    STAPHYLOCOCCUS EPIDERMIDIS STAPHYLOCOCCUS CAPITIS    Report Status PENDING  Incomplete  Urine culture     Status: Abnormal   Collection Time: 11/20/2020  6:58 PM   Specimen: Urine, Random  Result Value Ref Range Status   Specimen Description   Final    URINE, RANDOM Performed at [facility], [address] [place], [place] [address]    Special Requests   Final    NONE Performed at [facility], [address] [place], [place] [address]    Culture MULTIPLE SPECIES PRESENT, SUGGEST RECOLLECTION (A)  Final   Report Status 11/20/2020 FINAL  Final  Gastrointestinal Panel by PCR , Stool     Status: None   Collection Time: 11/19/20  6:40 AM   Specimen: Stool  Result Value Ref Range Status   Campylobacter species NOT DETECTED NOT DETECTED Final   Plesimonas shigelloides NOT DETECTED NOT DETECTED Final   Salmonella species NOT DETECTED NOT DETECTED Final   Yersinia enterocolitica NOT DETECTED NOT DETECTED Final   Vibrio species NOT DETECTED NOT DETECTED Final   Vibrio cholerae NOT DETECTED NOT DETECTED Final   Enteroaggregative E coli (EAEC) NOT DETECTED NOT DETECTED Final   Enteropathogenic E coli (EPEC) NOT DETECTED NOT DETECTED Final   Enterotoxigenic E coli (ETEC) NOT DETECTED NOT DETECTED Final   Shiga like toxin producing E coli (STEC) NOT DETECTED NOT DETECTED Final   Shigella/Enteroinvasive E coli (EIEC)  NOT DETECTED NOT DETECTED Final   Cryptosporidium NOT DETECTED NOT DETECTED Final   Cyclospora cayetanensis NOT DETECTED NOT DETECTED Final   Entamoeba histolytica NOT DETECTED NOT DETECTED Final   Giardia lamblia NOT DETECTED NOT DETECTED Final   Adenovirus F40/41 NOT DETECTED NOT DETECTED Final   Astrovirus NOT DETECTED NOT DETECTED Final   Norovirus GI/GII NOT DETECTED NOT DETECTED Final   Rotavirus A NOT DETECTED NOT DETECTED Final   Sapovirus (I, II, IV, and V) NOT DETECTED NOT DETECTED Final    Comment: Performed at [facility], [address] [place], [place] [address]  MRSA PCR Screening     Status: Abnormal   Collection Time: 11/20/20  3:00 PM   Specimen: Nasal Mucosa; Nasopharyngeal  Result Value Ref Range Status   MRSA by PCR POSITIVE (A) NEGATIVE Final    Comment:        The GeneXpert MRSA Assay (FDA approved for NASAL specimens only), is one component of a comprehensive MRSA colonization surveillance program. It is not intended to diagnose MRSA infection nor to guide or monitor treatment for MRSA infections. RESULT CALLED TO, READ BACK BY AND VERIFIED WITH: TY Martinique '@[email or web address]'  ON 11/20/20 SKL Performed at [facility], [address], [place], [place] [address]   Urine Culture     Status: None   Collection Time: 11/20/20  5:30 PM   Specimen: Urine, Catheterized  Result Value Ref Range Status   Specimen Description   Final    URINE, CATHETERIZED Performed at [facility], [address], [place], [place] [address]    Special Requests NONE  Final   Culture   Final    NO GROWTH Performed at [facility], [place] [address], [place], [place] [address]    Report Status 11/22/2020 FINAL  Final  CULTURE, BLOOD (ROUTINE X 2) w Reflex to ID Panel     Status: None (Preliminary result)   Collection Time: 11/20/20  6:40 PM   Specimen: BLOOD  Result Value Ref Range Status   Specimen Description BLOOD BLOOD LEFT FOREARM  Final   Special  Requests  Final    BOTTLES DRAWN AEROBIC AND ANAEROBIC Blood Culture adequate volume   Culture   Final    NO GROWTH 4 DAYS Performed at [facility], [address] [place], [place] [address]    Report Status PENDING  Incomplete  CULTURE, BLOOD (ROUTINE X 2) w Reflex to ID Panel     Status: None (Preliminary result)   Collection Time: 11/20/20  6:41 PM   Specimen: BLOOD  Result Value Ref Range Status   Specimen Description BLOOD BLOOD LEFT WRIST  Final   Special Requests   Final    BOTTLES DRAWN AEROBIC AND ANAEROBIC Blood Culture adequate volume   Culture   Final    NO GROWTH 4 DAYS Performed at [facility], [address], [place],  [address]    Report Status PENDING  Incomplete      Imaging Studies   No results found.   Medications   Scheduled Meds: . LORazepam  1 mg Intravenous Q6H   Continuous Infusions: . lacosamide (VIMPAT) IV         LOS: 6 days    Time spent: 15 minutes    Ezekiel Slocumb, DO [facility]  11/24/2020, 3:54 PM    If 7PM-7AM, please contact night-coverage. How to contact the [facility] Attending or Consulting provider [place] or covering provider during after hours [place], for this patient?    1. Check the care team in [facility] and look for a) attending/consulting [facility] provider listed and b) the [facility] team listed 2. Log into [email or web address] and use [place]'s universal password to access. If you do not have the password, please contact the hospital operator. 3. Locate the [facility] provider you are looking for under [facility] and page to a number that you can be directly reached. 4. If you still have difficulty reaching the provider, please page the [facility] (Director on Call) for the Hospitalists listed on amion for assistance.

## 2020-11-24 NOTE — Plan of Care (Signed)

## 2020-11-24 NOTE — Progress Notes (Signed)
Palliative: Stanley Nguyen is full comfort care.  Conference with bedside nursing staff related to symptom management needs.  Conference with attending, bedside nursing staff, transition of care team related to patient condition, needs, disposition. Family declines residential hospice unless it is with Physicist, medical in [place].  At this time, [place] [facility] time is not accepting Covid positive patients.  Plan: Full comfort care Prognosis: Days anticipated.    15 minutes Lillia Carmel, NP Palliative medicine team Team phone [phone] Greater than 50% of this time was spent counseling and coordinating care related to the above assessment and plan.

## 2020-11-25 DIAGNOSIS — U071 COVID-19: Secondary | ICD-10-CM | POA: Diagnosis not present

## 2020-11-25 DIAGNOSIS — A419 Sepsis, unspecified organism: Secondary | ICD-10-CM | POA: Diagnosis not present

## 2020-11-25 DIAGNOSIS — Z7189 Other specified counseling: Secondary | ICD-10-CM | POA: Diagnosis not present

## 2020-11-25 DIAGNOSIS — Z515 Encounter for palliative care: Secondary | ICD-10-CM | POA: Diagnosis not present

## 2020-11-25 LAB — CULTURE, BLOOD (ROUTINE X 2)
Culture: NO GROWTH
Culture: NO GROWTH
Special Requests: ADEQUATE
Special Requests: ADEQUATE
Special Requests: ADEQUATE

## 2020-11-25 MED ORDER — MORPHINE 100MG IN NS 100ML (1MG/ML) PREMIX INFUSION
2.0000 mg/h | INTRAVENOUS | Status: DC
  Administered 2020-11-25 (×2): 2 mg/h via INTRAVENOUS
  Administered 2020-11-25 (×2): 3 mg/h via INTRAVENOUS
  Administered 2020-11-25: 2 mg/h via INTRAVENOUS
  Filled 2020-11-25: qty 100

## 2020-11-25 MED ORDER — LORAZEPAM 2 MG/ML IJ SOLN: 1.0000 mg | INTRAMUSCULAR | Status: DC | PRN

## 2020-11-29 NOTE — Progress Notes (Signed)
Dr Arville Care aware that pt expired

## 2020-11-29 NOTE — Progress Notes (Signed)
Attempt to call both daughter and wife to make them aware that pt looks very close to passing, very shallow RR with long periods of apnea, no answer from either

## 2020-11-29 NOTE — Progress Notes (Signed)
Palliative: Stanley Nguyen is lying quietly in bed.  He does not respond in any meaningful way to voice or touch.  He is full comfort care, appears to be actively dying.  There is no family at bedside at this time due to Covid visitor restrictions.  Symptom management needs discussed with bedside nursing staff.  Medications adjusted.  Conference with attending, bedside nursing staff, transition of care team related to patient condition, needs, goals of care.  Plan: Full comfort care, let nature take its course. Prognosis: Hours likely, anticipate hospital death.  35 minutes Lillia Carmel, NP Palliative medicine team Team phone [phone] Greater than 50% of this time was spent counseling and coordinating care related to the above assessment and plan.

## 2020-11-29 NOTE — Progress Notes (Signed)
Pts wife and daughter made aware that pt has expired, given funeral home information and both state that no one would be coming to hospital tonight

## 2020-11-29 NOTE — Progress Notes (Signed)
During rounds pt noted without pulse and respirations, confirmed by myself and Angelica Loistine Simas RN

## 2020-11-29 NOTE — Progress Notes (Signed)
PROGRESS NOTE    Stanley Nguyen   NWG:[id number]  DOB: 01-18-1946  PCP: Jodi Marble, MD    DOA: 11/26/2020 LOS: 7   Brief Narrative   HPI: Stanley Nguyen is a 75 y.o. male with medical history significant for A. fib on Xarelto, seizure disorder on Vimpat, alcoholic cirrhosis(stopped drinking 5 years ago), CAD, COPD, HTN, unvaccinated against COVID, who was in his usual state of health until 5 days ago when he stopped eating.  Most of the history is taken from the wife over the phone due to the patient's change in mental status. He had a cough and shortness of breath. CT scan of the abdomen done was worrisome for partial small bowel obstruction.  He was seen by the general surgery and advised NG tube.  However during the NG tube placement he vomited and may have aspirated and developed worsening tachypnea.He was moved to step down unit b/c of unstable respiratory status as he was full code and we were worried he may get intubated. PCCM was consulted incase he needed to be intubated. He developed coagulopathy, hematology was consulted incase he was in DIC> but heme didn't think he was in DIC. Family made him DNR today , plan is to monitor overnight if by morning he is not stable, talk to family about comfort measures as we have discussed this tonight and are in this agreement. ID was consulted for staph epi bacteremia.   I assumed care of patient 1/26.  Notified at start of shift patient hypotensive, tachycardic, tachypneic, rapid response had been called.  Family had consulted with palliative care previously.  Patient was made DNR status on 1/25.  After family updated on patient's declining status and poor prognosis, decision was made to transition to comfort care.  Family reportedly declined hospice home placement as these would be out of town due to Covid+ status.  Anticipate in hospital death.       Assessment & Plan   Principal Problem:   Comfort measures only status Active Problems:    Acute encephalopathy   Chronic atrial fibrillation (HCC)   Chronic pain   Peripheral vascular disease, unspecified (HCC)   HTN (hypertension)   Cirrhosis (HCC)   COPD with chronic bronchitis (HCC)   CAD (coronary artery disease)   Chronic anticoagulation   Chronic, continuous use of opioids   AKI (acute kidney injury) ([place])   Hyponatremia   Elevated troponin   Severe sepsis (HCC)   Acute metabolic encephalopathy   COVID-19 virus infection   Acute respiratory failure with hypoxia (HCC)   Seizure disorder (HCC)   Acute gastroenteritis   Partial small bowel obstruction (HCC)   Pneumonia due to COVID-19 virus   Coagulopathy (HCC)   COMFORT CARE STATUS - as of morning of 11/23/20.   --comfort care per orders --oxygen only if needed for comfort, wean as able --notify provider if any signs of distress/discomfort despite current comfort meds   Prior A&P (before comfort care), per Dr. Kurtis Bushman with edits to update: "75 year old male with a history of A. fib on Xarelto, seizure disorder on Vimpat, alcoholic cirrhosis(stopped drinking 5 years ago), CAD, COPD, HTN, unvaccinated against COVID, presentingdecreased appetitex 5 days and I day ofnausea and vomiting and abdominal pain and altered mental status. Met severe sepsis criteria and tested positive for covid.  Severe sepsis/septic shock(HCC) -present on admission Met severe sepsis criteria with fever, hypotension, leukocytosis with lactic acid 4.7 altered mental status, AKI and acute respiratory failure -Septic source  uncertain: COVID+,  urinalysis sterile,. Suspecting intra-abdominal source due to vomiting and diarrhea Procalcitonin elevated. See CT A/P results. Chest x-ray revealed patchy consolidation bilateral lungs worse than prior exam which is likely consistent to aspiration since he vomited Positive for Covid , not vaccinated, started on remdesivir and Solu-Medrol To multiple organisms recommend recollection Blood cultures  with staph epi, MRSA detected ID consulted and input was appreciated, 4/4 staph epid. Bacteremia possibly be from skin tears eventhough dont appear to be infected STOP vancomycin Staph capitis in one of the cultures contaminant 1/25-Repeat bcx pending Echo pending Leukocytosis improving Continue midodrine STOP Unasyn for asp. pna (replaced metronidazole and cefepime) Now Pt is DNR and comfort care status as of 1/26.  Acute metabolic encephalopathy- due to sepsis/covid/shock/aspiration pneumonia.  Persistent. MS at baseline was good per family, this was new change Still no improvement Will continue tx for sepsis/infection/covid neurocks Asp precautions  Acute respiratory failure with hypoxia (HCC) Possible covid PNA v.s. Aspiration PNA. Repeat cxr from 1/22 with worsening b/l consolidation Possibly both, but suspect more aspiration PNA PCCM consulted since patient at risk of intubation 1/25-initially on cefepime and flagyl >> Unasyn on 1/24>> dc 1/26 for comfort care. STOP iv Remdesivir and steroids for coivid  Aspiration PNA Continues to be tachypnic on exam.  Comfort status as above. Npo due to intra abd. Issues and somonlence Off antibiotics Asp precaution  Coagulopathy Elevated fibrin derivatives INR elevated at 2.8, ck am labs Concern for DIC...> hematology was consulted, input was appreciated.-likely multifactorial secondary to Xarelto, antibiotics, malnutrition, underlying cirrhosis/sepsis. No evidence of any acute liver failure as AST and ALT bili normal  Hypocalcemia- ca 6.4 Previously given IV calcium gluconate.  No further labs.   AKI (acute kidney injury) (HCC)-prerenal -Secondary to vomiting and diarrhea as well as severe sepsis Improving with IV fluids, creatinine 1.21 Avoid nephrotoxic medication and renally dose other meds  Acute gastroenteritis, possiblyCOVID-related -Patient presents with a several hour history of vomiting, diarrhea and  abdominal pain -Possibly COVID gastroenteritis- inflammatory v.s. infectious. 1/22-see CT a/p report Supportive care  Severe malnutrition - defer dietician consult given comfort measures.  Partial SBO on CT abdomen and pelvis -CT abdomen resulted after admission outlined above showing partial small bowel obstruction with fluid-filled duodenum and stomach 1/22-NGT to be placed, per nsg unable to place , pt vomited during placement and was fightening the tube. 1/24-multiple BM.  Gen surgery following. Recommend continue NPO.  Per surgery can consider CT angio if worsening abdominal pain or persistent nausea and vomiting but again and/V/D with no obvious complaints of abdominal pain is not consistent with acute ischemic bowel. Monitor, support care for comfort as needed.  Hyponatremia resolved / Hypernatremia -Serum sodium 130>>>135>>>141>>146 Hyponatremia resolved with fluids.  Now hypernatremic due to poor PO intake, water deficit.   Comfort care, no further lab monitoring.   Elevated troponin, likely demand ischemia History of CAD 1/22-TP trending down.   echo -pending   Chronic atrial fibrillation (HCC) Chronic anticoagulation -Hold rate control agent due to hypotension on admission -?Alen Blew, unsure if pt was taking this.  Chronic painon chronic opiates - now on morphine PRN comfort. -Holding OxyContin for now due to altered mental status, NPO for asipration -As needed short acting pain meds as needed  Hypotension with history ofHTN (hypertension) -Hold home antihypertensives for now due to hypotension from severe sepsis and resume as appropriate Stared on midodrine, will continue  Alcoholic cirrhosis ([place]) -Appears stable. No alcohol use in 5 years  COPD with  chronic bronchitis ([place]) -Not acutely exacerbated   COVID-positive status -Possibly causing acute gastroenteritis as above, possibly incidental -see above about respiratory  status. -crp and fibrin derv. Elevated. tx with Remdesivir and iv steroid   Seizure disorder ([place]) -Continue Vimpat     Patient BMI: Body mass index is 21.52 kg/m.   DVT prophylaxis:    Diet:  Diet Orders (From admission, onward)    Start     Ordered   11/23/20 0927  Diet NPO time specified Except for: Other (See Comments)  Diet effective now       Comments: Pleasure foods as requested/tolerated  Question:  Except for  Answer:  Other (See Comments)   11/23/20 0926            Code Status: DNR    Subjective 11-26-20    Patient sedated and appears comfortable. Now on morphine infusion.  No family were present when patient seen today.  He does appear comfortable and without signs of pain or discomfort.  Disposition Plan & Communication   Status is: Inpatient  Remains inpatient appropriate because:Inpatient level of care appropriate due to severity of illness and now comfort care status requiring IV medications and close monitoring for comfort.  Anticipated hospital death.  Dispo: The patient is from: Home              Anticipated d/c is to: Funeral home              Anticipated d/c date is: 1 day              Patient currently is not medically stable to d/c.   Difficult to place patient Yes   Family Communication: daughter updated by phone morning of 1/26.  Patient been made comfort care shortly thereafter.     Consults, Procedures, Significant Events   Consultants:   Palliative care  Procedures:   none  Antimicrobials:  Anti-infectives (From admission, onward)   Start     Dose/Rate Route Frequency Ordered Stop   11/22/20 1700  vancomycin (VANCOREADY) IVPB 1250 mg/250 mL  Status:  Discontinued        1,250 mg 166.7 mL/hr over 90 Minutes Intravenous Every 24 hours 11/22/20 1205 11/23/20 0928   11/21/20 1800  Ampicillin-Sulbactam (UNASYN) 3 g in sodium chloride 0.9 % 100 mL IVPB  Status:  Discontinued        3 g 200 mL/hr over 30 Minutes Intravenous  Every 6 hours 11/21/20 1550 11/23/20 0928   11/21/20 1700  vancomycin (VANCOCIN) IVPB 1000 mg/200 mL premix  Status:  Discontinued        1,000 mg 200 mL/hr over 60 Minutes Intravenous Every 24 hours 11/21/20 1526 11/22/20 1205   11/19/20 1800  ceFEPIme (MAXIPIME) 2 g in sodium chloride 0.9 % 100 mL IVPB  Status:  Discontinued        2 g 200 mL/hr over 30 Minutes Intravenous Every 24 hours 11/19/20 0202 11/19/20 0821   11/19/20 1700  vancomycin (VANCOREADY) IVPB 750 mg/150 mL  Status:  Discontinued        750 mg 150 mL/hr over 60 Minutes Intravenous Every 24 hours 11/19/20 0204 11/21/20 1526   11/19/20 1000  remdesivir 100 mg in sodium chloride 0.9 % 100 mL IVPB       "Followed by" Linked Group Details   100 mg 200 mL/hr over 30 Minutes Intravenous Daily 11/19/2020 2118 11/22/20 0930   11/19/20 1000  ceFEPIme (MAXIPIME) 2 g in sodium chloride 0.9 %  100 mL IVPB  Status:  Discontinued        2 g 200 mL/hr over 30 Minutes Intravenous Every 12 hours 11/19/20 0821 11/21/20 1550   11/12/2020 2345  metroNIDAZOLE (FLAGYL) IVPB 500 mg  Status:  Discontinued        500 mg 100 mL/hr over 60 Minutes Intravenous Every 8 hours 11/14/2020 2118 11/21/20 1550   11/08/2020 2130  ceFEPIme (MAXIPIME) 2 g in sodium chloride 0.9 % 100 mL IVPB  Status:  Discontinued        2 g 200 mL/hr over 30 Minutes Intravenous  Once 11/09/2020 2118 11/15/2020 2152   11/13/2020 2130  vancomycin (VANCOCIN) IVPB 1000 mg/200 mL premix  Status:  Discontinued        1,000 mg 200 mL/hr over 60 Minutes Intravenous  Once 11/05/2020 2118 11/23/2020 2148   11/11/2020 2130  remdesivir 200 mg in sodium chloride 0.9% 250 mL IVPB       "Followed by" Linked Group Details   200 mg 580 mL/hr over 30 Minutes Intravenous Once 10/31/2020 2118 11/21/2020 2252   11/26/2020 2100  vancomycin (VANCOREADY) IVPB 1500 mg/300 mL        1,500 mg 150 mL/hr over 120 Minutes Intravenous  Once 11/07/2020 2020 11/19/20 0113   11/03/2020 2100  ceFEPIme (MAXIPIME) 2 g in sodium  chloride 0.9 % 100 mL IVPB        2 g 200 mL/hr over 30 Minutes Intravenous  Once 11/13/2020 2020 11/28/2020 2106        Objective   Vitals:   11/24/20 0813 11/24/20 1153 11/24/20 2357 06-Dec-2020 0751  BP:  (!) 168/66 (!) 180/80 (!) 157/65  Pulse:  63 64 63  Resp:  20 14 (!) 36  Temp:  97.8 F (36.6 C)  (!) 97.5 F (36.4 C)  TempSrc:    Axillary  SpO2: 94% 95% 94% 93%  Weight:      Height:        Intake/Output Summary (Last 24 hours) at 12/06/2020 1518 Last data filed at December 06, 2020 1030 Gross per 24 hour  Intake 30 ml  Output 475 ml  Net -445 ml   Filed Weights   11/26/2020 1850  Weight: 68 kg    Physical Exam:  General exam: sleeping comfortably, no acute distress, cachectic, pale HEENT: cyanotic lips and tongue, mouth open, dry mucus membranes Respiratory system: symmetric chest rise, mildly increased respiratory effort with accessory muscle use. Cardiovascular system: RRR, no peripheral edema Gastrointestinal system: soft, non-distended Extremities: cool hands and feet, no mottling seen, intact pedal pulses  Labs   Data Reviewed: I have personally reviewed following labs and imaging studies  CBC: Recent Labs  Lab 11/19/20 0640 11/20/20 0414 11/21/20 0236 11/22/20 0941 11/23/20 0616  WBC 17.7* 16.3* 13.1* 11.5* 15.3*  NEUTROABS 15.1* 14.4* 11.2* 10.0* 13.3*  HGB 10.1* 10.3* 9.6* 9.1* 11.1*  HCT 29.7* 29.1* 26.3* 26.3* 32.9*  MCV 85.1 83.4 81.7 85.1 85.9  PLT 92* 129* 107* 85* 712*   Basic Metabolic Panel: Recent Labs  Lab 11/19/20 0640 11/19/20 1805 11/20/20 0414 11/20/20 1551 11/21/20 0236 11/22/20 0941 11/23/20 0616  NA 135  --  141  --  146* 146* 152*  K 3.0* 3.5 3.1*  --  3.1* 3.4* 3.5  CL 100  --  105  --  114* 113* 119*  CO2 24  --  21*  --  21* 22 21*  GLUCOSE 121*  --  94  --  175* 158* 133*  BUN 31*  --  38*  --  37* 28* 30*  CREATININE 1.70*  --  1.66*  --  1.50* 1.21 1.32*  CALCIUM 6.1*  --  6.5*  --  6.8* 6.4* 7.0*  MG  --   --  0.8*  2.1 1.7 1.7 1.9  PHOS  --   --  2.3*  --  1.7* 2.8 3.8   GFR: Estimated Creatinine Clearance: 47.9 mL/min (A) (by C-G formula based on SCr of 1.32 mg/dL (H)). Liver Function Tests: Recent Labs  Lab 11/19/20 0640 11/20/20 0414 11/21/20 0236 11/22/20 0941 11/23/20 0616  AST 29 45* 48* 26 20  ALT '9 11 12 12 12  ' ALKPHOS 45 47 49 75 91  BILITOT 1.5* 2.3* 1.9* 1.3* 1.3*  PROT 5.8* 6.2* 5.6* 5.5* 5.8*  ALBUMIN 1.5* 1.7* 1.5* 1.4* 1.5*   Recent Labs  Lab 11/02/2020 2230  LIPASE 19   Recent Labs  Lab 11/22/20 0941  AMMONIA 22   Coagulation Profile: Recent Labs  Lab 11/26/2020 1857 11/20/20 1841 11/21/20 0236 11/23/20 0616  INR 2.4* 3.2* 2.8* 2.0*   Cardiac Enzymes: No results for input(s): CKTOTAL, CKMB, CKMBINDEX, TROPONINI in the last 168 hours. BNP (last 3 results) No results for input(s): PROBNP in the last 8760 hours. HbA1C: No results for input(s): HGBA1C in the last 72 hours. CBG: Recent Labs  Lab 11/20/20 1436 11/23/20 0711  GLUCAP 101* 106*   Lipid Profile: No results for input(s): CHOL, HDL, LDLCALC, TRIG, CHOLHDL, LDLDIRECT in the last 72 hours. Thyroid Function Tests: No results for input(s): TSH, T4TOTAL, FREET4, T3FREE, THYROIDAB in the last 72 hours. Anemia Panel: No results for input(s): VITAMINB12, FOLATE, FERRITIN, TIBC, IRON, RETICCTPCT in the last 72 hours. Sepsis Labs: Recent Labs  Lab 11/19/2020 1857 11/05/2020 2230 11/19/20 0640  PROCALCITON  --   --  3.00  LATICACIDVEN 4.7* 2.6*  --     Recent Results (from the past 240 hour(s))  Blood Culture (routine x 2)     Status: Abnormal   Collection Time: 10/31/2020  6:57 PM   Specimen: BLOOD  Result Value Ref Range Status   Specimen Description   Final    BLOOD LEFT ANTECUBITAL Performed at [facility], [address] [place], [place] [address]    Special Requests   Final    BOTTLES DRAWN AEROBIC AND ANAEROBIC Blood Culture results may not be optimal due to an inadequate volume of  blood received in culture bottles Performed at [facility], [address], [place], [place] [address]    Culture  Setup Time   Final    GRAM POSITIVE COCCI IN BOTH AEROBIC AND ANAEROBIC BOTTLES CRITICAL RESULT CALLED TO, READ BACK BY AND VERIFIED WITH: LISA KLUTZ AT 1903 11/19/20.MF Performed at [facility], [place] [address], [place],  [address]    Culture (A)  Final    STAPHYLOCOCCUS EPIDERMIDIS STAPHYLOCOCCUS CAPITIS    Report Status December 14, 2020 FINAL  Final   Organism ID, Bacteria STAPHYLOCOCCUS EPIDERMIDIS  Final   Organism ID, Bacteria STAPHYLOCOCCUS CAPITIS  Final      Susceptibility   Staphylococcus capitis - MIC*    CIPROFLOXACIN <=0.5 SENSITIVE Sensitive     ERYTHROMYCIN <=0.25 SENSITIVE Sensitive     GENTAMICIN <=0.5 SENSITIVE Sensitive     OXACILLIN SENSITIVE Sensitive     TETRACYCLINE <=1 SENSITIVE Sensitive     VANCOMYCIN 1 SENSITIVE Sensitive     TRIMETH/SULFA <=10 SENSITIVE Sensitive     CLINDAMYCIN <=0.25  SENSITIVE Sensitive     RIFAMPIN <=0.5 SENSITIVE Sensitive     Inducible Clindamycin NEGATIVE Sensitive     * STAPHYLOCOCCUS CAPITIS   Staphylococcus epidermidis - MIC*    CIPROFLOXACIN >=8 RESISTANT Resistant     ERYTHROMYCIN <=0.25 SENSITIVE Sensitive     GENTAMICIN <=0.5 SENSITIVE Sensitive     OXACILLIN >=4 RESISTANT Resistant     TETRACYCLINE >=16 RESISTANT Resistant     VANCOMYCIN 2 SENSITIVE Sensitive     TRIMETH/SULFA <=10 SENSITIVE Sensitive     CLINDAMYCIN <=0.25 SENSITIVE Sensitive     RIFAMPIN <=0.5 SENSITIVE Sensitive     Inducible Clindamycin NEGATIVE Sensitive     * STAPHYLOCOCCUS EPIDERMIDIS  Blood Culture ID Panel (Reflexed)     Status: Abnormal   Collection Time: 11/09/2020  6:57 PM  Result Value Ref Range Status   Enterococcus faecalis NOT DETECTED NOT DETECTED Final   Enterococcus Faecium NOT DETECTED NOT DETECTED Final   Listeria monocytogenes NOT DETECTED NOT DETECTED Final   Staphylococcus species DETECTED (A)  NOT DETECTED Final    Comment: CRITICAL RESULT CALLED TO, READ BACK BY AND VERIFIED WITH: LISA Alber AT 1903 11/19/20. MF    Staphylococcus aureus (BCID) NOT DETECTED NOT DETECTED Final   Staphylococcus epidermidis DETECTED (A) NOT DETECTED Final    Comment: Methicillin (oxacillin) resistant coagulase negative staphylococcus. Possible blood culture contaminant (unless isolated from more than one blood culture draw or clinical case suggests pathogenicity). No antibiotic treatment is indicated for blood  culture contaminants. CRITICAL RESULT CALLED TO, READ BACK BY AND VERIFIED WITH:  LISA Zamor AT 1903 11/19/20. MF    Staphylococcus lugdunensis NOT DETECTED NOT DETECTED Final   Streptococcus species NOT DETECTED NOT DETECTED Final   Streptococcus agalactiae NOT DETECTED NOT DETECTED Final   Streptococcus pneumoniae NOT DETECTED NOT DETECTED Final   Streptococcus pyogenes NOT DETECTED NOT DETECTED Final   A.calcoaceticus-baumannii NOT DETECTED NOT DETECTED Final   Bacteroides fragilis NOT DETECTED NOT DETECTED Final   Enterobacterales NOT DETECTED NOT DETECTED Final   Enterobacter cloacae complex NOT DETECTED NOT DETECTED Final   Escherichia coli NOT DETECTED NOT DETECTED Final   Klebsiella aerogenes NOT DETECTED NOT DETECTED Final   Klebsiella oxytoca NOT DETECTED NOT DETECTED Final   Klebsiella pneumoniae NOT DETECTED NOT DETECTED Final   Proteus species NOT DETECTED NOT DETECTED Final   Salmonella species NOT DETECTED NOT DETECTED Final   Serratia marcescens NOT DETECTED NOT DETECTED Final   Haemophilus influenzae NOT DETECTED NOT DETECTED Final   Neisseria meningitidis NOT DETECTED NOT DETECTED Final   Pseudomonas aeruginosa NOT DETECTED NOT DETECTED Final   Stenotrophomonas maltophilia NOT DETECTED NOT DETECTED Final   Candida albicans NOT DETECTED NOT DETECTED Final   Candida auris NOT DETECTED NOT DETECTED Final   Candida glabrata NOT DETECTED NOT DETECTED Final   Candida krusei NOT  DETECTED NOT DETECTED Final   Candida parapsilosis NOT DETECTED NOT DETECTED Final   Candida tropicalis NOT DETECTED NOT DETECTED Final   Cryptococcus neoformans/gattii NOT DETECTED NOT DETECTED Final   Methicillin resistance mecA/C DETECTED (A) NOT DETECTED Final    Comment: CRITICAL RESULT CALLED TO, READ BACK BY AND VERIFIED WITH: LISA Ramakrishnan AT 1903 11/19/20. MF Performed at [facility], [address] [place], [place] [address]   Blood Culture (routine x 2)     Status: Abnormal   Collection Time: 11/08/2020  6:58 PM   Specimen: BLOOD  Result Value Ref Range Status   Specimen Description   Final  BLOOD RIGHT ANTECUBITAL Performed at [facility], [address] [place], [place] [address]    Special Requests   Final    BOTTLES DRAWN AEROBIC AND ANAEROBIC Blood Culture adequate volume Performed at [facility], [address] [place], [place] [address]    Culture  Setup Time   Final    GRAM POSITIVE COCCI IN BOTH AEROBIC AND ANAEROBIC BOTTLES CRITICAL VALUE NOTED.  VALUE IS CONSISTENT WITH PREVIOUSLY REPORTED AND CALLED VALUE. Performed at [facility], [place], [address] [place], [place] [address]    Culture (A)  Final    STAPHYLOCOCCUS EPIDERMIDIS STAPHYLOCOCCUS CAPITIS SUSCEPTIBILITIES PERFORMED ON PREVIOUS CULTURE WITHIN THE LAST 5 DAYS. Performed at [facility], [place] [address], [place], [place] [address]    Report Status 12-17-2020 FINAL  Final  Urine culture     Status: Abnormal   Collection Time: 11/28/2020  6:58 PM   Specimen: Urine, Random  Result Value Ref Range Status   Specimen Description   Final    URINE, RANDOM Performed at [facility], [address] [place], [place] [address]    Special Requests   Final    NONE Performed at [facility], [address] [place], [place] [address]    Culture MULTIPLE SPECIES PRESENT, SUGGEST RECOLLECTION (A)  Final   Report Status 11/20/2020 FINAL  Final   Gastrointestinal Panel by PCR , Stool     Status: None   Collection Time: 11/19/20  6:40 AM   Specimen: Stool  Result Value Ref Range Status   Campylobacter species NOT DETECTED NOT DETECTED Final   Plesimonas shigelloides NOT DETECTED NOT DETECTED Final   Salmonella species NOT DETECTED NOT DETECTED Final   Yersinia enterocolitica NOT DETECTED NOT DETECTED Final   Vibrio species NOT DETECTED NOT DETECTED Final   Vibrio cholerae NOT DETECTED NOT DETECTED Final   Enteroaggregative E coli (EAEC) NOT DETECTED NOT DETECTED Final   Enteropathogenic E coli (EPEC) NOT DETECTED NOT DETECTED Final   Enterotoxigenic E coli (ETEC) NOT DETECTED NOT DETECTED Final   Shiga like toxin producing E coli (STEC) NOT DETECTED NOT DETECTED Final   Shigella/Enteroinvasive E coli (EIEC) NOT DETECTED NOT DETECTED Final   Cryptosporidium NOT DETECTED NOT DETECTED Final   Cyclospora cayetanensis NOT DETECTED NOT DETECTED Final   Entamoeba histolytica NOT DETECTED NOT DETECTED Final   Giardia lamblia NOT DETECTED NOT DETECTED Final   Adenovirus F40/41 NOT DETECTED NOT DETECTED Final   Astrovirus NOT DETECTED NOT DETECTED Final   Norovirus GI/GII NOT DETECTED NOT DETECTED Final   Rotavirus A NOT DETECTED NOT DETECTED Final   Sapovirus (I, II, IV, and V) NOT DETECTED NOT DETECTED Final    Comment: Performed at [facility], [address] [place], [place] [address]  MRSA PCR Screening     Status: Abnormal   Collection Time: 11/20/20  3:00 PM   Specimen: Nasal Mucosa; Nasopharyngeal  Result Value Ref Range Status   MRSA by PCR POSITIVE (A) NEGATIVE Final    Comment:        The GeneXpert MRSA Assay (FDA approved for NASAL specimens only), is one component of a comprehensive MRSA colonization surveillance program. It is not intended to diagnose MRSA infection nor to guide or monitor treatment for MRSA infections. RESULT CALLED TO, READ BACK BY AND VERIFIED WITH: TY Martinique '@[email or web address]'  ON 11/20/20  SKL Performed at [facility], [address] [place], [place] [address]   Urine Culture  Status: None   Collection Time: 11/20/20  5:30 PM   Specimen: Urine, Catheterized  Result Value Ref Range Status   Specimen Description   Final    URINE, CATHETERIZED Performed at [facility], [address], [place], [place] [address]    Special Requests NONE  Final   Culture   Final    NO GROWTH Performed at [facility], [place] [address] [place], [place] [address]    Report Status 11/22/2020 FINAL  Final  CULTURE, BLOOD (ROUTINE X 2) w Reflex to ID Panel     Status: None   Collection Time: 11/20/20  6:40 PM   Specimen: BLOOD  Result Value Ref Range Status   Specimen Description BLOOD BLOOD LEFT FOREARM  Final   Special Requests   Final    BOTTLES DRAWN AEROBIC AND ANAEROBIC Blood Culture adequate volume   Culture   Final    NO GROWTH 5 DAYS Performed at [facility], [address] [place] [place], [place] [id number]    Report Status 21-Dec-2020 FINAL  Final  CULTURE, BLOOD (ROUTINE X 2) w Reflex to ID Panel     Status: None   Collection Time: 11/20/20  6:41 PM   Specimen: BLOOD  Result Value Ref Range Status   Specimen Description BLOOD BLOOD LEFT WRIST  Final   Special Requests   Final    BOTTLES DRAWN AEROBIC AND ANAEROBIC Blood Culture adequate volume   Culture   Final    NO GROWTH 5 DAYS Performed at [facility], [address], [place], [place] [address]    Report Status 12/21/2020 FINAL  Final      Imaging Studies   No results found.   Medications   Scheduled Meds: . LORazepam  1 mg Intravenous Q6H   Continuous Infusions: . morphine 2 mg/hr (Dec 21, 2020 1027)       LOS: 7 days    Time spent: 15 minutes    Ezekiel Slocumb, DO [facility]  Dec 21, 2020, 3:18 PM    If 7PM-7AM, please contact night-coverage. How to contact the [facility] Attending or Consulting provider [place] or covering provider during after  hours [place], for this patient?    1. Check the care team in [facility] and look for a) attending/consulting [facility] provider listed and b) the [facility] team listed 2. Log into [email or web address] and use [place]'s universal password to access. If you do not have the password, please contact the hospital operator. 3. Locate the [facility] provider you are looking for under [facility] and page to a number that you can be directly reached. 4. If you still have difficulty reaching the provider, please page the [facility] (Director on Call) for the Hospitalists listed on amion for assistance.

## 2020-11-29 NOTE — Progress Notes (Signed)
Spoke with Helmut Muster with update on pt. Wife and one daughter at bedside earlier this evening.

## 2020-11-29 NOTE — Progress Notes (Signed)
Spoke with wife Diamond Nickel to make her aware that pt will pass at any time now, states that she doesn't plan on coming back to hospital due to her on state of health but she will have her daughter call and let us know her plans

## 2020-11-29 DEATH — deceased

## 2020-12-27 NOTE — Death Summary Note (Signed)
Death Summary  Stanley Nguyen YQI:[id number] DOB: 1946-06-24 DOA: Nov 28, 2020  PCP: Stanley Monday, MD PCP/Office notified:   Admit date: 11-28-20 Date of Death: 2020-12-10  Final Diagnoses:  Principal Problem:   Comfort measures only status Active Problems:   Acute encephalopathy   Chronic atrial fibrillation (HCC)   Chronic pain   Peripheral vascular disease, unspecified (HCC)   HTN (hypertension)   Cirrhosis (HCC)   COPD with chronic bronchitis (HCC)   CAD (coronary artery disease)   Chronic anticoagulation   Chronic, continuous use of opioids   AKI (acute kidney injury) (HCC)   Hyponatremia   Elevated troponin   Severe sepsis (HCC)   Acute metabolic encephalopathy   COVID-19 virus infection   Acute respiratory failure with hypoxia (HCC)   Seizure disorder (HCC)   Acute gastroenteritis   Partial small bowel obstruction (HCC)   Pneumonia due to COVID-19 virus   Coagulopathy (HCC)    1. Respiratory failure with hypoxia due to covid-19 pneumonia with resultant septic shock 2. Severe acute metabolic encephalopathy  History of present illness & Hospital Course Stanley Nguyen a 75 y.o.malewith medical history significant forA. fib on Xarelto, seizure disorder on Vimpat, alcoholic cirrhosis(stopped drinking 5 years ago), CAD, COPD, HTN, unvaccinated against COVID, who was in his usual state of health until 5 days ago when he stopped eating. Most of the history is taken from the wife over the phone due to the patient's change in mental status.He had a cough and shortness of breath. CT scan of the abdomen done was worrisome for partial small bowel obstruction. He was seen by the general surgery and advised NG tube. However during the NG tube placement he vomited and may have aspirated and developed worsening tachypnea.He was moved to step down unit b/c of unstable respiratory status as he was full code and we were worried he may get intubated. PCCM was consulted incase he  needed to be intubated. He developed coagulopathy, hematology was consulted incase he was in DIC> but heme didn't think he was in DIC. Family made him DNR today , plan is to monitor overnight if by morning he is not stable, talk to family about comfort measures as we have discussed this tonight and are in this agreement. ID was consulted for staph epi bacteremia.   I assumed care of patient 1/26.  Notified at start of shift patient hypotensive, tachycardic, tachypneic, rapid response had been called.  Family had consulted with palliative care previously.  Patient was made DNR status on 1/25.  After family updated on patient's declining status and poor prognosis, decision was made to transition to comfort care.  Family reportedly declined hospice home placement as these would be out of town due to Covid+ status.  Anticipate in hospital death.    Patient treated for comfort with medications including morphine infusion.     Time: 2040  Signed:  Pennie Banter, DO  [facility] Dec 10, 2020, 10:25 PM

## 2022-11-02 IMAGING — CT CT ABD-PELV W/O CM
2 of 5 series · 11 of 46 positions shown, 12 images · non-contrast
Comparison: None.

CLINICAL DATA: COVID pneumonia, generalized weakness, sepsis,
vomiting, unspecified abdominal pain

EXAM:
CT ABDOMEN AND PELVIS WITHOUT CONTRAST
TECHNIQUE: Multidetector CT imaging of the abdomen and pelvis was performed
following the standard protocol without IV contrast.

[Series 5: coronal st · coronal · 0.68mm/px · 3 of 91 slices shown]
[im 31/91  soft-tissue]
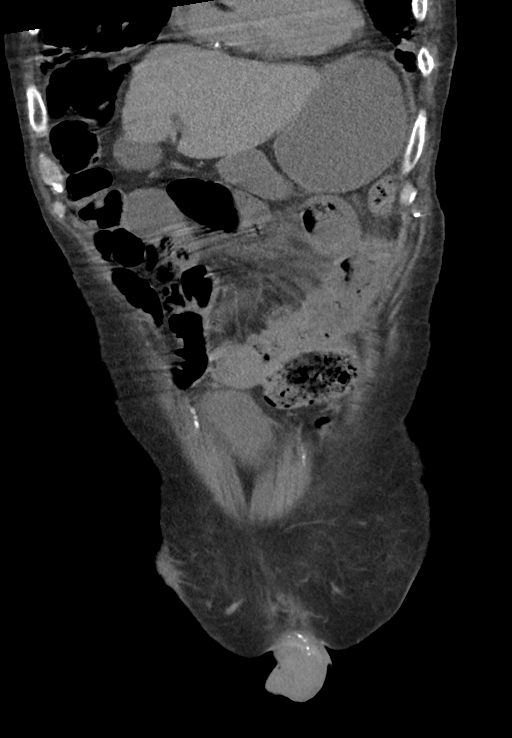
[im 41/91  soft-tissue]
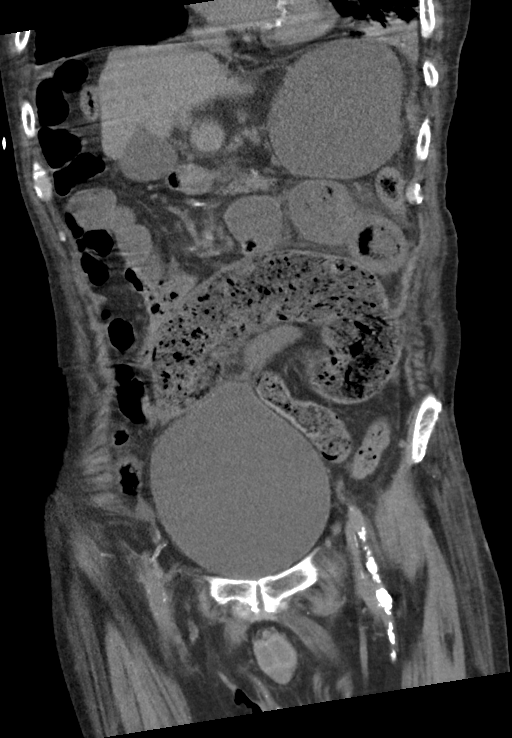
[im 51/91  soft-tissue]
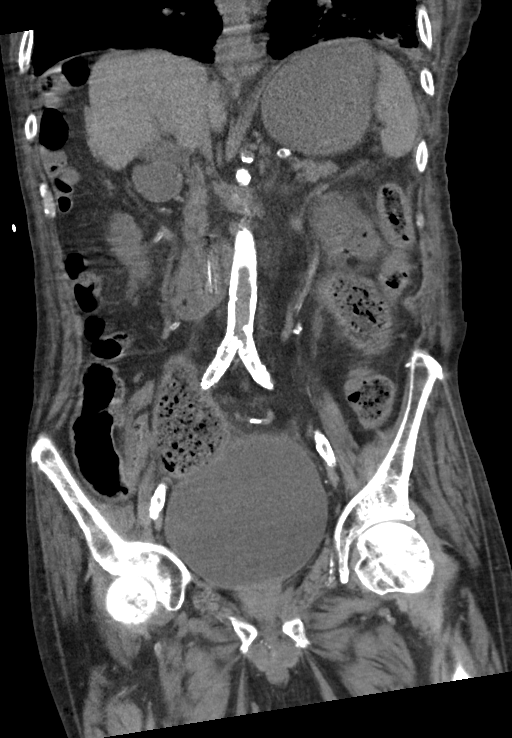

[Series 7: axial st · axial · 0.53mm/px · z∈[-1057,-637]mm · 8 of 101 slices shown, 9 images]
[im 8/101  soft-tissue]
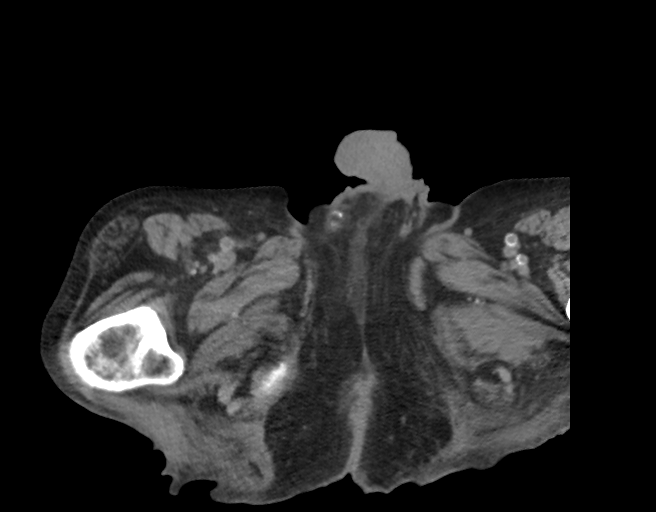
[im 8/101  bone]
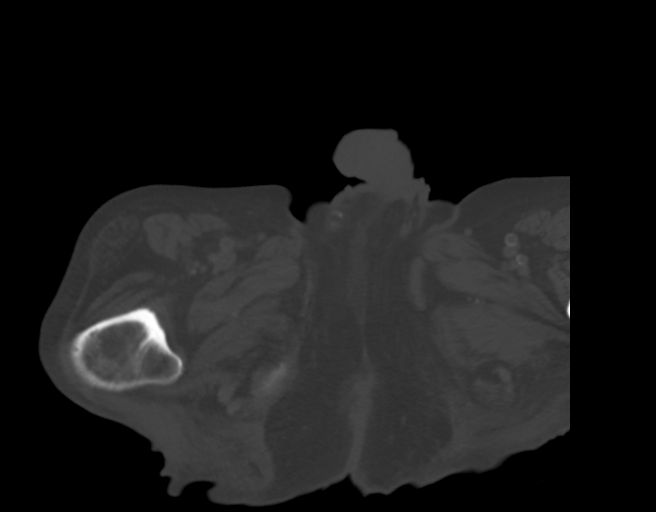
[im 22/101  soft-tissue]
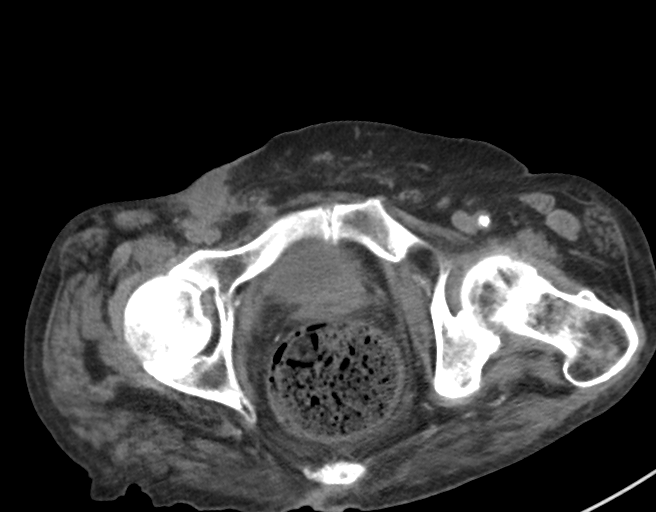
[im 36/101  soft-tissue]
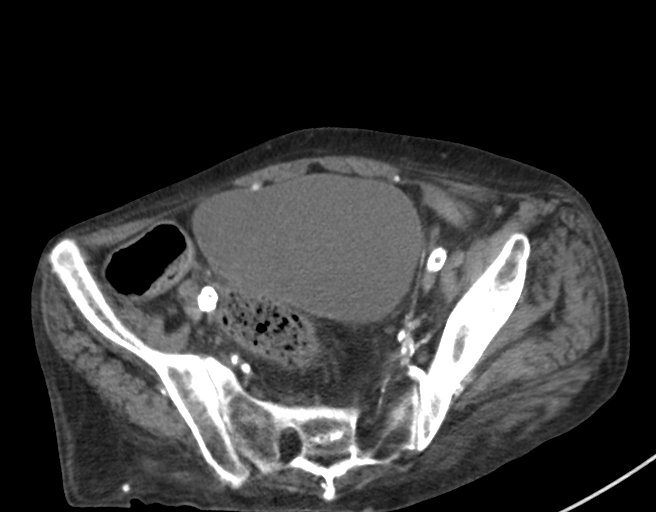
[im 43/101  soft-tissue]
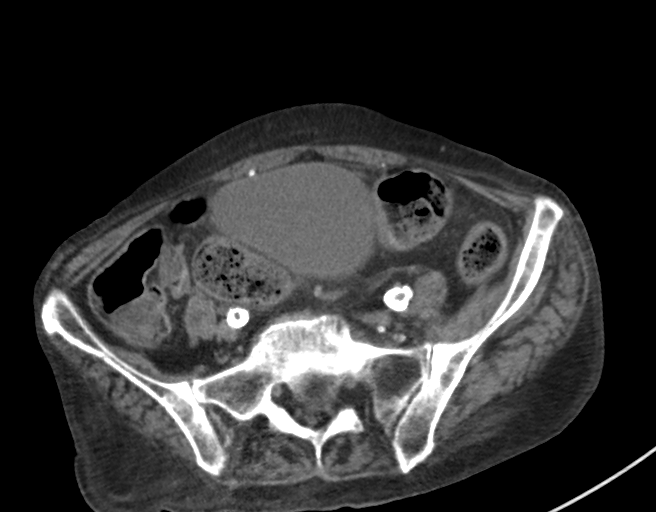
[im 58/101  soft-tissue]
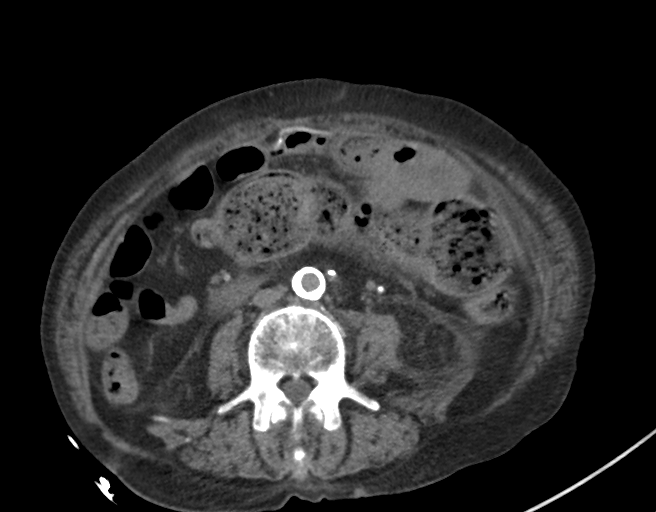
[im 65/101  soft-tissue]
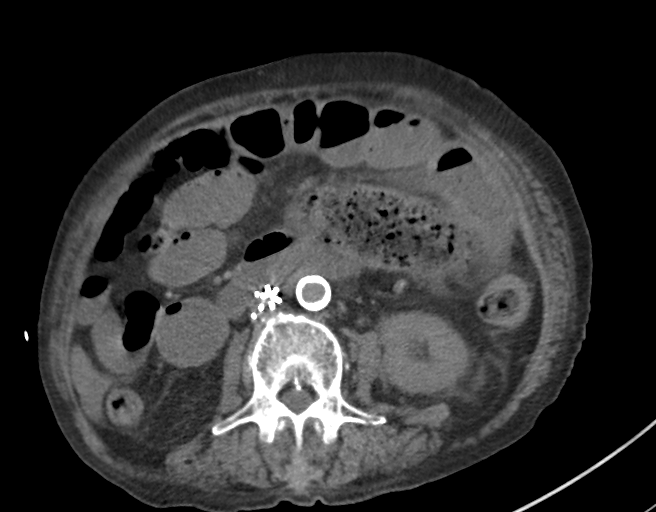
[im 79/101  soft-tissue]
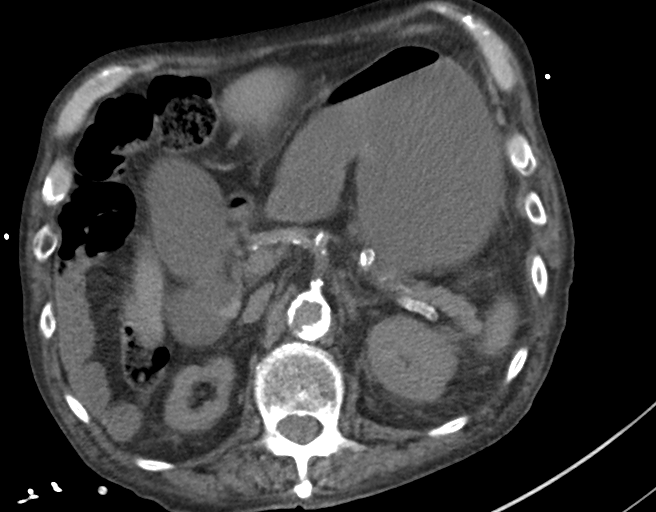
[im 93/101  soft-tissue]
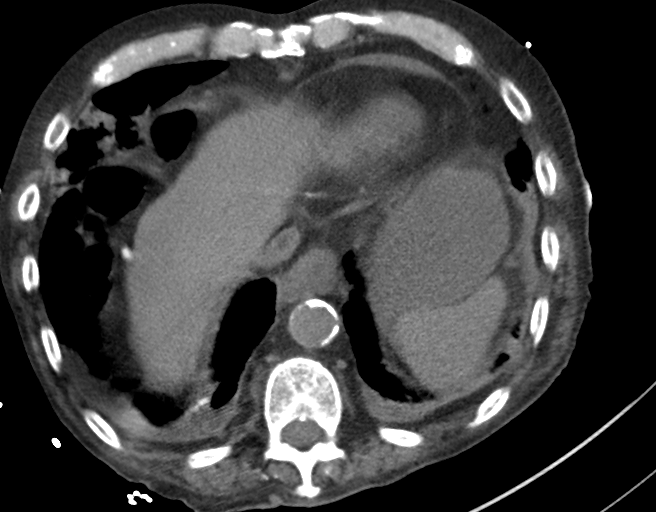

[11 of 46 positions shown; findings below may reference images not displayed]

FINDINGS: Lower chest: There is asymmetric peribronchial nodularity within the
visualized lung bases with airway impaction noted, best noted within
the segmental and subsegmental bronchi of the left lower lobe and
basilar lingula in keeping with acute infection or aspiration. Trace
bilateral pleural effusions are present. Right basilar parenchymal
scarring is noted.

Extensive right coronary artery calcification. Global cardiac size
within normal limits. No pericardial effusion. Small
gastroesophageal varices are identified.

Hepatobiliary: There is hypertrophy of the left hepatic lobe and
caudate lobe and the liver contour is mildly nodular in keeping with
changes of cirrhosis. Recanalization of the umbilical vein is noted.
No definite focal intrahepatic mass identified on this noncontrast
examination. No definite intra or extrahepatic biliary ductal
dilation. Gallbladder unremarkable.

Pancreas: Unremarkable

Spleen: Unremarkable

Adrenals/Urinary Tract: The adrenal glands are unremarkable. The
right kidney is markedly atrophic. The left kidney is normal in size
and position and demonstrates preserved cortical thickness. There is
no hydronephrosis. Extensive vascular calcifications are noted
within the renal hilum. Mild nonspecific perinephric stranding is
present. No loculated pararenal fluid collections identified. No
ureteral calculi. The bladder is unremarkable.

Stomach/Bowel: The stomach is fluid-filled, as is the duodenum.
There is asymmetric mesenteric edema and circumferential bowel wall
thickening involving the proximal jejunum which appears mildly
dilated suggesting changes of a a infectious or inflammatory focal
enteritis with resultant partial small bowel obstruction. A discrete
focal transition point is not identified.

The remainder of the small bowel is unremarkable. There is large
volume stool within the distal colon and rectal vault. No free
intraperitoneal gas or fluid. The appendix is unremarkable.

Vascular/Lymphatic: Extensive aortoiliac atherosclerotic
calcification is present. Particularly prominent atherosclerotic
calcification is seen within the a proximal renal arteries and
mesenteric arteries with almost certain hemodynamically significant
stenosis of the celiac axis and superior mesenteric artery
proximally, though this is not well assessed on this noncontrast
examination. Bilateral lower extremity arterial inflow stenting has
been performed. Inferior vena cava filter is seen within the
infrarenal cava.

No pathologic adenopathy within the abdomen and pelvis.

Reproductive: Prostate is unremarkable.

Other: There is infiltrative change within the right inguinal region
superficial to the a common femoral artery. This may represent the
sequela of a recent vascular intervention with resultant hematoma, a
local inflammatory process such as a developing abscess or
suppurative lymph node, or an infiltrative mass. This is not well
assessed on this noncontrast examination. This measures 2.4 x 3.2 cm
on axial image # 77

Musculoskeletal: No lytic or blastic bone lesions are seen.
IMPRESSION: Bibasilar pulmonary infiltrates and airway impaction most in keeping
with changes of acute infection or aspiration. Given the findings
below, aspiration is favored.

Circumferential bowel wall thickening and focal mesenteric edema
involving the proximal jejunum just beyond the ligament of Treitz
suggesting an underlying infectious or inflammatory enteritis. Focal
ischemia would be unusual in this location, however, given the
degree of vascular disease, segmental vascular thrombosis and/or
distal embolization could also result in a similar appearance.
Resultant partial small bowel obstruction with fluid-filled duodenum
and stomach. Nasogastric intubation may be helpful for further
management.

Peripheral vascular disease with extensive calcification and almost
certain hemodynamically significant stenosis involving the proximal
mesenteric arterial vasculature. The degree of stenosis would be
better assessed with CT arteriography, if indicated.

Infiltrative change within the right inguinal region, possibly
posttraumatic, infectious, or neoplastic. This could be further
assessed with dedicated sonography.

Aortic Atherosclerosis ([id number]).
